# Patient Record
Sex: Female | Born: 1955 | Race: White | Hispanic: No | Marital: Married | Smoking: Current every day smoker
Health system: Southern US, Community
[De-identification: ages and names within clinical notes are randomized; demographics above are authoritative.]

## PROBLEM LIST (undated history)

## (undated) DIAGNOSIS — K219 Gastro-esophageal reflux disease without esophagitis: Secondary | ICD-10-CM

## (undated) DIAGNOSIS — M26609 Unspecified temporomandibular joint disorder, unspecified side: Secondary | ICD-10-CM

## (undated) DIAGNOSIS — F329 Major depressive disorder, single episode, unspecified: Secondary | ICD-10-CM

## (undated) DIAGNOSIS — K5790 Diverticulosis of intestine, part unspecified, without perforation or abscess without bleeding: Secondary | ICD-10-CM

## (undated) DIAGNOSIS — G8929 Other chronic pain: Secondary | ICD-10-CM

## (undated) DIAGNOSIS — H548 Legal blindness, as defined in USA: Secondary | ICD-10-CM

## (undated) DIAGNOSIS — F410 Panic disorder [episodic paroxysmal anxiety] without agoraphobia: Secondary | ICD-10-CM

## (undated) DIAGNOSIS — G9589 Other specified diseases of spinal cord: Secondary | ICD-10-CM

## (undated) DIAGNOSIS — J449 Chronic obstructive pulmonary disease, unspecified: Secondary | ICD-10-CM

## (undated) DIAGNOSIS — K589 Irritable bowel syndrome without diarrhea: Secondary | ICD-10-CM

## (undated) DIAGNOSIS — Z9981 Dependence on supplemental oxygen: Secondary | ICD-10-CM

## (undated) DIAGNOSIS — R0600 Dyspnea, unspecified: Secondary | ICD-10-CM

## (undated) DIAGNOSIS — J189 Pneumonia, unspecified organism: Secondary | ICD-10-CM

## (undated) DIAGNOSIS — F4310 Post-traumatic stress disorder, unspecified: Secondary | ICD-10-CM

## (undated) DIAGNOSIS — F32A Depression, unspecified: Secondary | ICD-10-CM

## (undated) DIAGNOSIS — C801 Malignant (primary) neoplasm, unspecified: Secondary | ICD-10-CM

## (undated) HISTORY — PX: EYE SURGERY: SHX253

## (undated) HISTORY — PX: FRACTURE SURGERY: SHX138

## (undated) HISTORY — PX: APPENDECTOMY: SHX54

## (undated) HISTORY — PX: CHOLECYSTECTOMY: SHX55

---

## 2002-10-09 ENCOUNTER — Emergency Department (HOSPITAL_COMMUNITY): Admission: EM | Admit: 2002-10-09 | Discharge: 2002-10-09 | Payer: Self-pay | Admitting: Emergency Medicine

## 2005-10-17 ENCOUNTER — Ambulatory Visit (HOSPITAL_COMMUNITY): Admission: RE | Admit: 2005-10-17 | Discharge: 2005-10-17 | Payer: Self-pay | Admitting: Family Medicine

## 2006-02-12 ENCOUNTER — Ambulatory Visit (HOSPITAL_COMMUNITY): Admission: RE | Admit: 2006-02-12 | Discharge: 2006-02-12 | Payer: Self-pay | Admitting: Family Medicine

## 2006-10-01 ENCOUNTER — Ambulatory Visit: Payer: Self-pay | Admitting: Internal Medicine

## 2006-10-16 ENCOUNTER — Encounter (INDEPENDENT_AMBULATORY_CARE_PROVIDER_SITE_OTHER): Payer: Self-pay | Admitting: Specialist

## 2006-10-16 ENCOUNTER — Ambulatory Visit: Payer: Self-pay | Admitting: Internal Medicine

## 2006-10-16 ENCOUNTER — Ambulatory Visit (HOSPITAL_COMMUNITY): Admission: RE | Admit: 2006-10-16 | Discharge: 2006-10-16 | Payer: Self-pay | Admitting: Internal Medicine

## 2006-11-03 ENCOUNTER — Ambulatory Visit (HOSPITAL_COMMUNITY): Admission: RE | Admit: 2006-11-03 | Discharge: 2006-11-03 | Payer: Self-pay | Admitting: Family Medicine

## 2007-02-24 ENCOUNTER — Emergency Department (HOSPITAL_COMMUNITY): Admission: EM | Admit: 2007-02-24 | Discharge: 2007-02-24 | Payer: Self-pay | Admitting: Emergency Medicine

## 2007-09-07 ENCOUNTER — Ambulatory Visit: Payer: Self-pay | Admitting: Orthopedic Surgery

## 2008-02-19 ENCOUNTER — Observation Stay (HOSPITAL_COMMUNITY): Admission: AD | Admit: 2008-02-19 | Discharge: 2008-02-20 | Payer: Self-pay | Admitting: Family Medicine

## 2008-03-07 ENCOUNTER — Ambulatory Visit: Payer: Self-pay | Admitting: Internal Medicine

## 2008-03-17 ENCOUNTER — Ambulatory Visit: Payer: Self-pay | Admitting: Internal Medicine

## 2008-05-28 ENCOUNTER — Emergency Department (HOSPITAL_COMMUNITY): Admission: EM | Admit: 2008-05-28 | Discharge: 2008-05-28 | Payer: Self-pay | Admitting: Emergency Medicine

## 2008-06-01 ENCOUNTER — Ambulatory Visit (HOSPITAL_COMMUNITY): Admission: RE | Admit: 2008-06-01 | Discharge: 2008-06-01 | Payer: Self-pay | Admitting: Family Medicine

## 2008-06-15 ENCOUNTER — Encounter (HOSPITAL_COMMUNITY): Admission: RE | Admit: 2008-06-15 | Discharge: 2008-07-15 | Payer: Self-pay | Admitting: Family Medicine

## 2008-07-16 ENCOUNTER — Emergency Department (HOSPITAL_COMMUNITY): Admission: EM | Admit: 2008-07-16 | Discharge: 2008-07-17 | Payer: Self-pay | Admitting: Emergency Medicine

## 2008-07-20 ENCOUNTER — Encounter (INDEPENDENT_AMBULATORY_CARE_PROVIDER_SITE_OTHER): Payer: Self-pay | Admitting: General Surgery

## 2008-07-20 ENCOUNTER — Observation Stay (HOSPITAL_COMMUNITY): Admission: RE | Admit: 2008-07-20 | Discharge: 2008-07-21 | Payer: Self-pay | Admitting: General Surgery

## 2008-08-14 ENCOUNTER — Emergency Department (HOSPITAL_COMMUNITY): Admission: EM | Admit: 2008-08-14 | Discharge: 2008-08-14 | Payer: Self-pay | Admitting: Emergency Medicine

## 2008-08-21 ENCOUNTER — Emergency Department (HOSPITAL_COMMUNITY): Admission: EM | Admit: 2008-08-21 | Discharge: 2008-08-21 | Payer: Self-pay | Admitting: Emergency Medicine

## 2008-09-15 ENCOUNTER — Emergency Department (HOSPITAL_COMMUNITY): Admission: EM | Admit: 2008-09-15 | Discharge: 2008-09-15 | Payer: Self-pay | Admitting: Emergency Medicine

## 2008-10-05 ENCOUNTER — Emergency Department (HOSPITAL_COMMUNITY): Admission: EM | Admit: 2008-10-05 | Discharge: 2008-10-05 | Payer: Self-pay | Admitting: Emergency Medicine

## 2008-10-25 ENCOUNTER — Emergency Department (HOSPITAL_COMMUNITY): Admission: EM | Admit: 2008-10-25 | Discharge: 2008-10-25 | Payer: Self-pay | Admitting: Emergency Medicine

## 2008-10-28 ENCOUNTER — Emergency Department (HOSPITAL_COMMUNITY): Admission: EM | Admit: 2008-10-28 | Discharge: 2008-10-28 | Payer: Self-pay | Admitting: Emergency Medicine

## 2009-02-21 ENCOUNTER — Ambulatory Visit (HOSPITAL_COMMUNITY): Admission: RE | Admit: 2009-02-21 | Discharge: 2009-02-21 | Payer: Self-pay | Admitting: Family Medicine

## 2009-02-24 ENCOUNTER — Ambulatory Visit (HOSPITAL_COMMUNITY): Admission: RE | Admit: 2009-02-24 | Discharge: 2009-02-24 | Payer: Self-pay | Admitting: Family Medicine

## 2011-01-06 ENCOUNTER — Encounter: Payer: Self-pay | Admitting: Family Medicine

## 2011-01-08 ENCOUNTER — Emergency Department (HOSPITAL_COMMUNITY)
Admission: EM | Admit: 2011-01-08 | Discharge: 2011-01-08 | Payer: Self-pay | Source: Home / Self Care | Admitting: Emergency Medicine

## 2011-01-09 LAB — DIFFERENTIAL
Basophils Absolute: 0 10*3/uL (ref 0.0–0.1)
Basophils Relative: 0 % (ref 0–1)
Eosinophils Absolute: 0.1 10*3/uL (ref 0.0–0.7)
Eosinophils Relative: 1 % (ref 0–5)
Lymphocytes Relative: 38 % (ref 12–46)
Monocytes Absolute: 0.5 10*3/uL (ref 0.1–1.0)
Monocytes Relative: 6 % (ref 3–12)
Neutro Abs: 5.2 10*3/uL (ref 1.7–7.7)
Neutrophils Relative %: 55 % (ref 43–77)

## 2011-01-09 LAB — CBC
HCT: 42.1 % (ref 36.0–46.0)
Hemoglobin: 14.8 g/dL (ref 12.0–15.0)
MCH: 33.2 pg (ref 26.0–34.0)
MCHC: 35.2 g/dL (ref 30.0–36.0)
Platelets: 218 10*3/uL (ref 150–400)
RBC: 4.46 MIL/uL (ref 3.87–5.11)
RDW: 12.8 % (ref 11.5–15.5)
WBC: 9.3 10*3/uL (ref 4.0–10.5)

## 2011-01-09 LAB — BASIC METABOLIC PANEL
BUN: 12 mg/dL (ref 6–23)
CO2: 31 mEq/L (ref 19–32)
Calcium: 9.7 mg/dL (ref 8.4–10.5)
Chloride: 104 mEq/L (ref 96–112)
Creatinine, Ser: 0.61 mg/dL (ref 0.4–1.2)
GFR calc non Af Amer: >60 mL/min (ref >60)
Glucose, Bld: 91 mg/dL (ref 70–99)
Potassium: 4.1 mEq/L (ref 3.5–5.1)
Sodium: 141 mEq/L (ref 135–145)

## 2011-01-09 LAB — SEDIMENTATION RATE: Sed Rate: 6 mm/hr (ref 0–22)

## 2011-04-30 NOTE — Op Note (Signed)
NAMETIFFANCY, MOGER               ACCOUNT NO.:  [id number]   MEDICAL RECORD NO.:  [id number]          PATIENT TYPE:  OBV   LOCATION:  A307                          FACILITY:  [facility]   PHYSICIAN:  Tilford Pillar, MD      DATE OF BIRTH:  1956-01-18   DATE OF PROCEDURE:  07/20/2008  DATE OF DISCHARGE:                               OPERATIVE REPORT   PREOPERATIVE DIAGNOSIS:  Biliary dyskinesia.   POSTOPERATIVE DIAGNOSIS:  Biliary dyskinesia.   PROCEDURE:  Laparoscopic cholecystectomy.   SURGEON:  Tilford Pillar, MD.   ANESTHESIA:  General endotracheal, local anesthetic, and 0.5%  Sensorcaine plain.   SPECIMEN:  Gallbladder.   ESTIMATED BLOOD LOSS:  Minimal.   INDICATIONS:  The patient is a 54 year old female with several medical  problems including persistent long course of nausea and epigastric  abdominal pain.  She had had extensive workup in regards with nausea and  epigastric pain including upper endoscopy, right upper quadrant  ultrasound, and HIDA evaluation.  On her HIDA scan, she did have some  exacerbation of her symptomatology with the oral administration of cream  as well as had a gallbladder demonstrating decreased ejection fraction.  Based on this, I have also discussed with the patient the findings of  biliary dyskinesia.  However, due to her long history of nausea, it was  discussed at length with the patient that all of her symptomatology may  not be attributable to the gallbladder.  However, even if this was  discussed at length with the patient, the patient did not wish to  proceed with a cholecystectomy.  The risk, benefits, and alternatives  were discussed at length with the patient including but not limited to  the risk of bleeding, infection, bile leak, small bowel injury, common  bile duct injury as well as possibility of intraoperative cardiac or  pulmonary events.  The patient's questions and concerns were addressed,  and the patient was consented for the  planned procedure.   OPERATION:  The patient was taken to the operating room and was placed  in a supine position on the operating room table at which time the  general anesthetic was administered.  Once the patient was asleep, she  was then endotracheally intubated by anesthesia.  Her abdomen was  prepped and draped with DuraPrep solution and draped in standard  fashion.  A stab incision was created supraumbilically with an 11-blade  scalpel.  Additional dissection down through subcuticular tissue was  carried out using a Kocher clamp, which was utilized to grasp the  anterior abdominal fascia and __________ anteriorly.  A Veress needle  was inserted.  Saline drop test was utilized to confirm intraperitoneal  placement, pneumoperitoneum was then initiated.  Once sufficient  pneumoperitoneum was obtained, the 11-mm trocar was inserted over the  laparoscope allowing visualization of the trocar entering into the  peritoneum.  At this point, the inner cannula was removed.  The  laparoscope was reinserted.  There was no evidence of any trocar or  Veress needle placement injury.  At this point, the patient was placed  in a reverse  Trendelenburg left lateral decubitus position.  The  remaining trocars were placed; an 11-mm trocar in the epigastrium, 5-mm  trocar in the right lateral abdominal wall, and a 5-mm trocar between  the two 11-mm trocars in the midline.  At this point, the fundus of the  gallbladder were lifted up and over the liver.  There were some omental  adhesions to the gallbladder.  These were bluntly stripped using a  combination of blunt and electrocautery dissection.  The peritoneal  reflection was then stripped off the infundibulum allowing exposure of  the cystic duct entering into the infundibulum.  With the cystic duct  exposed, a window was created behind the cystic duct.  Three Endoclips  were placed proximally, one distally, and cystic duct was divided  between the  most distal clips.  Similarly, the cystic artery was  identified.  The Endoclips were placed proximally and one distally, and  the cystic artery was divided between the two most distal clips.  At  this point, the gallbladder was dissected free from the gallbladder  fossa using electrocautery.  Once the gallbladder was free, it was  placed in the EndoCatch bag, which was placed up and over the right lobe  of the liver.  At this point, attention was turned to closure.   Using a Endoclose suture passing device, a 2-0 Vicryl was passed through  both the 11-mm trocar sites.  With these Vicryl sutures in place, the  gallbladder fossa was reevaluated.  Hemostasis was excellent.  A piece  of Surgicel was placed into the gallbladder fossa and then the  gallbladder was grasped and removed through the epigastric trocar site  in an intact EndoCatch bag.  With the gallbladder removed, this was  placed on the back table and sent as a permanent specimen to pathology.  At this time, the pneumoperitoneum was evacuated.  The Vicryl sutures  were then secured.  The local anesthetic was instilled and a 4-0  Monocryl was utilized to reapproximate the skin edges at all 4-trocar  sites with a running subcuticular suture.  The skin was washed and dried  with a moist and dry towel.  Benzoin was applied around the incision.  Half-inch Steri-Strips were placed, and the patient was allowed to come  out of general anesthetic.  She was transferred back to regular hospital  bed and was transferred to the postanesthetic care unit in a stable  condition.  At the conclusion of the procedure, all instrument, sponge,  and needle counts were correct.  The patient tolerated the procedure  well.      Tilford Pillar, MD  Electronically Signed     BZ/MEDQ  D:  07/20/2008  T:  07/21/2008  Job:  [id number]   cc:   Primary Care Physician

## 2011-04-30 NOTE — H&P (Signed)
[name], [name]               ACCOUNT NO.:  [id number]   MEDICAL RECORD NO.:  [id number]          PATIENT TYPE:  OBV   LOCATION:  A313                          FACILITY:  [facility]   PHYSICIAN:  Scott A. Gerda Diss, MD    DATE OF BIRTH:  09-07-1956   DATE OF ADMISSION:  DATE OF DISCHARGE:  LH                              HISTORY & PHYSICAL   CHIEF COMPLAINT:  Vomiting and diarrhea.   HISTORY OF PRESENT ILLNESS:  This is a 55 year old female who presented  to the office with approximately 2 week history of sickness. She states  it started with vomiting, diarrhea, pretty intense for the first 48  yours, then got a little bit better but she has had intermittent nausea  along with heartburn for the past 7 or 8 days and she states that it is  just very hard for her to swallow anything without it just making her  feel just really sick. She says she continues to just have very poor  intake over the last several days, only drinking small amounts of fluid.  She has not had any perfuse diarrhea. Just as watery stools, nonbloody.  Denies fever. Denies change in bowel color. Denies change in urine  color, other than it getting darker and less frequent. She relates she  is finding herself feeling quite dizzy when she stands up or when she  tries to move about. She states her energy level is sub-par and she just  feels very weak.   PAST MEDICAL HISTORY:  1. She has a history of irritable bowel syndrome. She had a negative      colonoscopy in November 2007. She had a Schatzki's ring with EGD      and Dr. Jena Gauss did a scope test back in November 2007. She has had H-      pylori, which was treated back in 2001.   FAMILY HISTORY:  Breast cancer, hypertension, heart disease.   SOCIAL HISTORY:  She does smoke. She denies illicit drug use. Denies  alcohol use.   CURRENT MEDICATIONS:  She does use intermittent Valium from other health  care [name]. Plus also, she uses Vicodin on occasional basis. She  does  take Protonix 40 mg daily. Denies anti-inflammatory use.   ALLERGIES:  NO KNOWN DRUG ALLERGIES.   REVIEW OF SYSTEMS:  Negative for headache. Negative for throat pain.  Negative for fever. Recently, did have some mild fever and chills when  this all began. Denies any severe abdominal pain, just relates nausea.   PHYSICAL EXAMINATION:  HEENT:  Lips are dry. Mucous membranes tachy.  NECK:  Supple.  VITAL SIGNS:  Pulse tachy around 100 to 110. Blood pressure 90/70 when  laying and standing it was 76/50. Weight is 107 pounds, whereas last  time her weight was 119 pounds on January 04, 2008. So, she has gone  down approximately 12 pounds over the past 6 weeks.  LUNGS:  No crackles.  CARDIOVASCULAR:  Regular. No murmurs.  ABDOMEN:  Soft. No guarding or rebound.  EXTREMITIES:  No edema.   LABORATORY DATA:  Her urine  in the office is concentrated with negative  ketones. Her met-7 has BUN and creatinine that is in acceptable range as  well as sodium and potassium. White count was good. Hemoglobin was good.  Liver profile was normal.   ASSESSMENT/PLAN:  Gastroenteritis. Zofran as needed for nausea. She is  complaining about headache today. Will use Dilaudid for headache and go  ahead and dry clear liquids. If she does okay with that, advance her to  a soft diet. In addition to this, I do not feel that this is gallbladder  disease. I do not feel that there is any type of recurrence of a  Schatzki's ring and I do not feel that she is dealing with an active  ulcer. I would recommend the Protonix and then GASTROINTESTINAL cocktail  and Maalox p.r.n. and I told her that if IV fluids went well and she was  able to urinate and keep liquids down, would advance to a soft diet in  the morning. If she is able to keep that down, then there is a good  possibility that she will be able to go home tomorrow on Protonix and on  a small dose of Vicodin that she can use as needed for headache. She   would need to followup in the office in 4 or 5 days for followup. If she  had continued trouble, would need to intervene by also looking at doing  potential further workup as an outpatient for gallbladder disease and  potentially further workup by having her see GI doctor for possible  procedures. I do feel at this point in time she is overall stable but  because of weight decrease and low blood pressure, IV fluids are  indicated. The patient agrees to this course. Also too, the hospitalist  will be seeing the patient on Saturday and overall, if she is doing  improved as above, she will followup in the office early next week.      Scott A. Gerda Diss, MD  Electronically Signed     SAL/MEDQ  D:  02/20/2008  T:  02/20/2008  Job:  [id number]

## 2011-04-30 NOTE — Assessment & Plan Note (Signed)
[facility]                         GASTROENTEROLOGY OFFICE NOTE   LESHAY, DESAULNIERS                      MRN:          [id number]  DATE:03/07/2008                            DOB:          04/16/1956    REQUESTING PHYSICIAN:  Scott A. Luking, M.D.   CHIEF COMPLAINT:  Abdominal pain, appetite and weight loss, alternating  bowel habits.   ASSESSMENT:  A 55 year old white woman who really looks like she has  irritable bowel syndrome based upon chronicity of symptoms and previous  workup including EGD and colonoscopy.  The situation is complicated by  chronic narcotic use which has resulted from abdominal pain problems as  well as other chronic pain.  She clearly has had very similar symptoms  off and on for a number of years.  It should be noted that this lady  weighed 112 pounds in 2007, when she was evaluated at [facility].   RECOMMENDATIONS AND PLAN:  1. CT of the abdomen and pelvis with IV and oral contrast.  2. If that is unrevealing, I think that the most likely help for this      lady would be to have an antidepressant started, something like      Remeron may be useful for her weight loss, appetite loss, and      nausea.  An SSRI may prove useful or she may need both of these.  3. I sincerely doubt further GI workup is indicated at this time.  4. Could try antispasmodics as well.   HISTORY:  This is a 55 year old white woman who complains of almost  every symptom on our GI review of systems list being positive.  She  complains of dysphagia, though she has previously had a Maloney  dilation.  She has indigestion and burning epigastric pain, nausea,  sometimes regurgitation, and rare vomiting perhaps.  She has lost, she  says, from 118-106 pounds in February.  She was admitted to the hospital  with normal CBC, C-MET testing just earlier this month, up in [place]  at [facility].  She had a lot of vomiting and diarrhea  at that  time.  She did have a concentrated urine but negative ketones.  She  seems to have recovered from that acute episode somewhat but still has  this other background of problems.  She is on Vicodin 4 times a day and  says she has been on that regularly for a couple of months but  intermittently prior to that but really has been taking it for a year.  She does realize she has a diagnosis of irritable bowel syndrome.  She  was not able to tolerate dicyclomine in the past.  She actually thinks  (if we are correct) if she got diarrhea with Lotronex.  She was to do  stool cultures but really has not been moving her bowels much since the  diarrheal illness and has not collected them yet because of that.   PAST WORKUP:  Included an EGD by Dr. Jonathon Bellows, with [facility]  dilation.  She had duodenal biopsies at that  time and these did not show  any evidence of celiac disease, this was in November 2007.  She had a  colonoscopy that was normal as well except for a few scattered  diverticula.  Stool samples were collected at that time and no infection  was found.   PAST MEDICAL HISTORY:  1. Irritable bowel syndrome.  2. Diverticulosis.  3. EGD, November 2007, did show a nonobstructive Schatzki ring and she      was dilated.  4. TMJ pain.  5. Fractured jaw on the left.  6. Eye surgery x2.  7. Chronic headaches.  8. Bilateral eye surgery.  9. 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  10.Depression.  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  12.History of H. pylori years ago at EGD, did not tolerate treatment.      No mention of gastritis at Dr. Luvenia Starch EGD - 2007.   FAMILY HISTORY:  Father had heart disease.  A cousin has had some sort  of inflammatory bowel disease.  Mother has had colon polyps.   SOCIAL HISTORY:  The patient is married.  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  She lives with her  husband.  They went  through some difficulty with loss of insurance or no  insurance because she was not working and her husband had an MI, though  she is now employed and insured.  She smokes cigarettes.  No alcohol or  other tobacco or drugs.   REVIEW OF SYSTEMS:  She is entering menopause and feels quite  symptomatic with night sweats and hot flashes disturbing her sleep,  though her GI symptoms do not tend to do so.  The remainder of the  review of systems positive for fatigue, urinary leakage, insomnia, and  sleep disturbance, joint pains, allergies, and eyeglasses, as well as  chronic back pain.  All other systems negative at this time.   PHYSICAL EXAMINATION:  VITAL SIGNS:  Reveal height 5 feet 1 inch, weight  110 pounds, blood pressure 112/68, pulse 76.  EYES:  Anicteric.  ENT:  Normal mouth, posterior pharynx.  NECK:  Supple.  No thyromegaly, mass.  CHEST:  Clear.  HEART:  S1 S2.  No murmurs, gallops, or rubs.  ABDOMEN:  Soft, benign.  No organomegaly or mass.  Nontender.  LYMPHATIC:  No neck, supraclavicular, axillary, or inguinal adenopathy.  EXTREMITIES:  Free of edema.  SKIN:  Warm and dry.  No acute rash.  PSYCH:  She has a somewhat flat affect.  Cranial nerves II-XII appear to  be intact.  Extraocular muscles, there is a right lateral gaze  preferential on the right eye.  Visual acuity was not tested.     Iva Boop, MD,FACG  Electronically Signed    CEG/MedQ  DD: 03/07/2008  DT: 03/07/2008  Job #: [id number]   cc:   Lorin Picket A. Gerda Diss, MD

## 2011-05-03 NOTE — Op Note (Signed)
[name], [name]               ACCOUNT NO.:  [id number]   MEDICAL RECORD NO.:  [id number]          PATIENT TYPE:  AMB   LOCATION:  DAY                           FACILITY:  [facility]   PHYSICIAN:  R. Roetta Sessions, M.D. DATE OF BIRTH:  [birth year]   DATE OF PROCEDURE:  10/16/2006  DATE OF DISCHARGE:                                 OPERATIVE REPORT   PROCEDURE PERFORMED:  Esophagogastroduodenoscopy with small bowel biopsies  and Maloney dilation and colonoscopy.   INDICATIONS FOR PROCEDURE:  The patient is a 55 year old lady with multiple  GI complaints including bloating, diarrhea and esophageal dysphagia.  She  has had weight loss as well.   Esophagogastroduodenoscopy with possible esophageal dilation and colonoscopy  is now being done.  This approach has been discussed with the patient at  length, potential risks, benefits and alternatives have been reviewed and  questions answered.  The patient is agreeable.  Please see documentation in  the medical record.   Oxygen saturations, blood pressure, pulse and respirations were monitored  throughout the entirety of the procedure.   CONSCIOUS SEDATION:  Versed 8 mg IV, Demerol 150 mg IV, Phenergan 25 mg  diluted slow IV push in incremental doses prior to the procedure.  Cetacaine  spray for topical pharyngeal anesthesia.   FINDINGS:  EGD examination of the tubular esophagus revealed a noncritical  Schatzki's ring.  Esophageal mucosa otherwise appeared normal.  Esophagogastric junction was easily traversed with the scope.   Stomach:  The gastric cavity was empty and insufflated well with air.  Thorough examination of the gastric mucosa including retroflex view of the  proximal stomach, esophagogastric junction demonstrated only a small hiatal  hernia.  Pylorus was patent and easily traversed.  Examination of the bulb  to second portion revealed grossly normal-appearing duodenal mucosa.   THERAPY/DIAGNOSTIC MANEUVERS PERFORMED:  1.  Biopsies of second and third portions of duodenum were taken for      histologic study.  Subsequently a 76 French Maloney dilator was passed      to full insertion. A look back revealed a superficial tear in      midesophagus.  Otherwise, no apparent complications related to passage      of the dilator.  The ring was disrupted.  The patient tolerated the      procedure well and was prepared for colonoscopy.   Digital exam revealed no abnormalities.   ENDOSCOPIC FINDINGS:  Prep was adequate.   Rectum:  Examination of the rectal mucosa including retroflex view of the  anal verge revealed no abnormalities.  Colon:  Colonic mucosa was surveyed from the rectosigmoid junction to the  left, transverse and right colon to the area of the appendiceal orifice and  ileocecal valve and cecum.  These structures were well seen and photographed  for the record.  I was unable to intubate the terminal ileum.  From this  level, the scope was slowly withdrawn.  All previously mentioned mucosal  surfaces were again seen.  The patient had a couple of scattered left-sided  diverticula, minimal in number.  Otherwise, the colonic mucosa  appeared  entirely normal.  The patient tolerated both procedures well, was reacted in  endoscopy.   IMPRESSION:  Esophagogastroduodenoscopy:  Schatzki's ring, otherwise  esophagus status post dilation as described above.  Small hiatal hernia,  otherwise normal stomach D1 through D3, status post biopsies of D2 and D3.   Colonoscopy:  Normal rectum, few scattered sigmoid diverticula,  remainder  colonic mucosa appeared normal.  Terminal ileum not intubated.  Not  mentioned above, stool samples collected.   RECOMMENDATIONS:  1. Continue Aciphex 20 mg orally twice daily for now.  2. Add Carafate 1 g slurry four times daily for five days.  3. Lortab 7.5 mg per 5 mL elixir, 7.5 mg every four to six hours as needed      for discomfort related to esophageal dilation.  4. Clear  liquid diet the rest of today.  Soft diet tomorrow, gradually      advance diet as tolerated after tomorrow.  Follow up on path.  5. Further recommendations to follow.      Jonathon Bellows, M.D.  Electronically Signed     RMR/MEDQ  D:  10/16/2006  T:  10/16/2006  Job:  [id number]   cc:   Lorin Picket A. Gerda Diss, MD  Fax: [phone]

## 2011-05-03 NOTE — Consult Note (Signed)
NAMEDHRITI, FALES               ACCOUNT NO.:  [id number]   MEDICAL RECORD NO.:  [id number]          PATIENT TYPE:  AMB   LOCATION:                                FACILITY:  [facility]   PHYSICIAN:  R. Roetta Sessions, M.D. DATE OF BIRTH:  Apr 26, 1956   DATE OF CONSULTATION:  DATE OF DISCHARGE:                                   CONSULTATION   REASON FOR CONSULTATION:  Multiple GI complaints.   HPI:  Ms. Lapaglia is a 55 year old, Caucasian female who tells me she has a  longstanding history of GI problems.  She tells me she has a history of IBS.  She says she recently moved to __________ approximately 2 years ago.  In the  last year, she tells me she just can't eat.  She has daily heartburn and  indigestion.  She also complains of oral pain with eating.  She feels as  though food gets stuck in her mid esophagus and stays there.  She has most  problems with solids which seem to roll back up her esophagus.  She denies  any problems with liquids.  She has daily nausea and vomiting.  She even has  awakened in the wee hours of the morning with vomiting.  She tells me she  has a hot poker to her epigastrium.  Pain is intermittent and she also has  a griping low abdominal pain.  She tells me she has lost 10 pounds in the  last year.  She is currently not eating any meals throughout the day.  She  is maintaining on Ensure and she is afraid to eat anything else.  She has  anorexia.  She has early satiety and she has frequent hiccups.  She tells me  anytime she eats anything she has diarrhea.  She can have anywhere from 4 to  20+ stools a day.  Generally, she begins to have diarrhea as soon as she  awakens in the morning.  She denies any rectal bleeding or melena.   PAST MEDICAL HISTORY:  1. Arthritis.  2. Anxiety.  3. 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  4. Bilateral eye surgery.  5. She tells me as an infant she had lactose intolerance and had multiple      food allergies.  6.  Chronic GERD.  7. IBS.  8. Depression.  9. She had a colonoscopy in the 1990s which she tells me was normal.  10.She is legally blind in the right eye.  She has a history of H. pylori,      however she was unable to tolerate treatment.   CURRENT MEDICATIONS:  1. Celexa 20 mg daily.  2. AcipHex 20 mg b.i.d.  3. Phenergan 25 mg q. 8 h. p.r.n.  4. Flexeril 10 mg nightly p.r.n.  5. Vicodin 5/500 mg q.i.d.  6. Imodium 2 per day p.r.n.   ALLERGIES:  BENADRYL, COMPAZINE, DARVOCET, AND FACTIVE.   FAMILY HISTORY:  There is a family history of a mother with IBS, breast  cancer, and DVTs.  Father age 39 with coronary artery  disease.  She has 1  healthy brother.   SOCIAL HISTORY:  Ms. Cliett has been married for 16 years.  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  She  has a 34-pack year history of tobacco use.  She denies any alcohol or drug  use.   REVIEW OF SYSTEM:  CONSTITUTIONAL:  See HPI.  Denies any fevers or chills.  CARDIOVASCULAR:  Denies any chest pain or palpitations.  PULMONARY:  Denies  any shortness of breath, dyspnea, cough, hemoptysis.  GI:  See HPI.   PHYSICAL EXAM:  VITAL SIGNS:  Weight 112.5 pounds.  Height 61 inches.  Temp  97.8.  Blood pressure 98/60 and pulse 64.  GENERAL:  Ms. Mcmannis is a 55 year old, thin, Caucasian female who is alert,  oriented, and pleasant, cooperative.  She is quite tearful.  She is in no  acute distress.  HEENT:  Oropharynx clear.  Sclerae nonicteric.  Conjunctivae pink.  Right  eye deviates laterally.  NECK:  Supple without mass or thyromegaly.  HEART:  Regular rate and rhythm.  Normal S1, S2 without murmurs, rubs, or  gallops.  LUNGS:  Clear to auscultation bilaterally.  ABDOMEN:  She has generalized abdominal pain throughout the entire abdomen.  There is no rebound tenderness or guarding.  No hepatosplenomegaly or mass.  EXTREMITIES:  Without clubbing or edema bilaterally.  SKIN:  Pink,  warm, and dry without rash or jaundice.   IMPRESSION:  Ms. Domke is a 55 year old, Caucasian female with multiple  gastrointestinal concerns today.  She tells me she just can't eat.  She  has anorexia, daily heartburn, and indigestion, as well as intermittent  nausea and vomiting.  She has solid food dysphagia as well as early satiety.  At this point, she is only consuming Ensure.  She tells me she is not able  to eat meals because she just gets so sick.  She is having chronic daily,  nonbloody diarrhea with upwards of 20 stools a day.  She tells me she has an  underlying history of irritable bowel syndrome, however, her symptoms  definitely seem very severe to be labeled irritable bowel syndrome and she  is going to require further evaluation with colonoscopy and  esophagogastroduodenoscopy at this time.  She also tells me she has a  history of being Helicobacter pylori positive but was unable to complete  treatment and we may have to determine whether she has been successfully  treated in the future if she remains symptomatic.   PLAN:  1. Esophagogastroduodenoscopy and colonoscopy with Dr. Jena Gauss in the near      future.  We discussed this procedure including risks and benefits      including but not limited to bleeding, infection, perforation, drug      reaction.  She agrees with plan and consent will be obtained.  2. Continue Imodium p.r.n. for now.  3. Continue Protonix 40 mg b.i.d.   I would like to thank Dr. Lilyan Punt for allowing [place] to participate in the  care of Ms. Brunsman.      Nicholas Lose, N.P.      Jonathon Bellows, M.D.  Electronically Signed    KC/MEDQ  D:  10/01/2006  T:  10/02/2006  Job:  [id number]

## 2011-09-09 LAB — HEPATIC FUNCTION PANEL
ALT: 15
AST: 19
Albumin: 4.1
Alkaline Phosphatase: 75
Bilirubin, Direct: 0.1
Indirect Bilirubin: 0.6
Total Bilirubin: 0.7
Total Protein: 7.6

## 2011-09-09 LAB — BASIC METABOLIC PANEL
BUN: 10
CO2: 27
Calcium: 10
Chloride: 100
Creatinine, Ser: 0.66
GFR calc Af Amer: >60
GFR calc non Af Amer: >60
Glucose, Bld: 94
Potassium: 3.6
Sodium: 137

## 2011-09-09 LAB — DIFFERENTIAL
Basophils Absolute: 0
Basophils Relative: 0
Eosinophils Absolute: 0
Eosinophils Relative: 0
Lymphocytes Relative: 24
Lymphs Abs: 2.4
Monocytes Absolute: 0.7
Monocytes Relative: 7
Neutro Abs: 6.9
Neutrophils Relative %: 69

## 2011-09-09 LAB — CBC
HCT: 45.7
Hemoglobin: 15.9 — ABNORMAL HIGH
MCHC: 34.8
MCV: 91.4
Platelets: 252
RBC: 5
RDW: 13.3
WBC: 10.1

## 2011-09-12 LAB — URINALYSIS, ROUTINE W REFLEX MICROSCOPIC
Glucose, UA: NEGATIVE
Ketones, ur: 15 — AB
Leukocytes, UA: NEGATIVE
Nitrite: NEGATIVE
Protein, ur: 30 — AB
Specific Gravity, Urine: >1.03 — ABNORMAL HIGH
Urobilinogen, UA: 1
pH: 6

## 2011-09-12 LAB — COMPREHENSIVE METABOLIC PANEL
ALT: 15
AST: 20
Albumin: 4.3
Alkaline Phosphatase: 66
BUN: 12
CO2: 26
Calcium: 9.7
Chloride: 101
Creatinine, Ser: 0.67
GFR calc Af Amer: >60
GFR calc non Af Amer: >60
Glucose, Bld: 128 — ABNORMAL HIGH
Potassium: 3.4 — ABNORMAL LOW
Sodium: 138
Total Bilirubin: 0.5
Total Protein: 7.9

## 2011-09-12 LAB — URINE MICROSCOPIC-ADD ON

## 2011-09-12 LAB — CBC
HCT: 44.6
Hemoglobin: 15.1 — ABNORMAL HIGH
MCHC: 33.9
MCV: 92.8
Platelets: 227
RBC: 4.8
RDW: 13.1
WBC: 12.9 — ABNORMAL HIGH

## 2011-09-12 LAB — DIFFERENTIAL
Basophils Absolute: 0
Basophils Relative: 0
Eosinophils Absolute: 0
Eosinophils Relative: 0
Lymphocytes Relative: 9 — ABNORMAL LOW
Lymphs Abs: 1.2
Monocytes Absolute: 0.3
Monocytes Relative: 2 — ABNORMAL LOW
Neutro Abs: 11.5 — ABNORMAL HIGH
Neutrophils Relative %: 89 — ABNORMAL HIGH

## 2011-09-13 LAB — COMPREHENSIVE METABOLIC PANEL
Alkaline Phosphatase: 51
BUN: 15
Chloride: 104
Glucose, Bld: 98
Potassium: 3.9
Total Bilirubin: 0.5

## 2011-09-13 LAB — URINALYSIS, ROUTINE W REFLEX MICROSCOPIC
Ketones, ur: 15 — AB
Protein, ur: NEGATIVE
Urobilinogen, UA: 0.2

## 2011-09-13 LAB — URINE CULTURE: Colony Count: 6000

## 2011-09-13 LAB — CBC
HCT: 39.6
HCT: 42.3
Hemoglobin: 14.1
Platelets: 205
RBC: 4.53
RDW: 13.7
WBC: 22.3 — ABNORMAL HIGH
WBC: 8.9

## 2011-09-13 LAB — DIFFERENTIAL
Basophils Absolute: 0
Basophils Absolute: 0
Basophils Relative: 0
Eosinophils Absolute: 0.1
Lymphocytes Relative: 9 — ABNORMAL LOW
Lymphs Abs: 2
Monocytes Absolute: 0.4
Neutro Abs: 19.3 — ABNORMAL HIGH
Neutro Abs: 5.4
Neutrophils Relative %: 60
Neutrophils Relative %: 87 — ABNORMAL HIGH

## 2011-09-13 LAB — BASIC METABOLIC PANEL
BUN: 7
Calcium: 9
Creatinine, Ser: 0.53
GFR calc non Af Amer: >60
Potassium: 3.6

## 2011-09-13 LAB — URINE MICROSCOPIC-ADD ON

## 2011-09-16 LAB — URINALYSIS, ROUTINE W REFLEX MICROSCOPIC
Glucose, UA: NEGATIVE
Leukocytes, UA: NEGATIVE
Nitrite: NEGATIVE
Specific Gravity, Urine: <1.005 — ABNORMAL LOW
pH: 7

## 2011-09-16 LAB — POCT I-STAT, CHEM 8
BUN: 6
Calcium, Ion: 1.03 — ABNORMAL LOW
Chloride: 109
Glucose, Bld: 77
HCT: 37
TCO2: 26

## 2011-09-16 LAB — URINE MICROSCOPIC-ADD ON

## 2011-09-17 LAB — URINE CULTURE

## 2011-09-17 LAB — CBC
HCT: 41.9
Hemoglobin: 14.2
MCHC: 33.8
Platelets: 223
RDW: 14.2

## 2011-09-17 LAB — COMPREHENSIVE METABOLIC PANEL
Albumin: 4.2
BUN: 10
Calcium: 9.3
Creatinine, Ser: 0.48
Glucose, Bld: 90
Total Protein: 7

## 2011-09-17 LAB — URINALYSIS, ROUTINE W REFLEX MICROSCOPIC
Glucose, UA: NEGATIVE
Ketones, ur: NEGATIVE
Protein, ur: NEGATIVE
pH: 5.5

## 2011-09-17 LAB — DIFFERENTIAL
Basophils Relative: 0
Lymphocytes Relative: 32
Lymphs Abs: 2.7
Monocytes Absolute: 0.4
Monocytes Relative: 5
Neutro Abs: 5.3
Neutrophils Relative %: 62

## 2011-09-17 LAB — PROTIME-INR
INR: 0.9
Prothrombin Time: 12.5

## 2011-09-17 LAB — APTT: aPTT: 29

## 2012-03-28 ENCOUNTER — Emergency Department (HOSPITAL_COMMUNITY): Payer: Self-pay

## 2012-03-28 ENCOUNTER — Emergency Department (HOSPITAL_COMMUNITY)
Admission: EM | Admit: 2012-03-28 | Discharge: 2012-03-29 | Disposition: A | Discharge status: Home / Self Care | Payer: Self-pay | Attending: Emergency Medicine | Admitting: Emergency Medicine

## 2012-03-28 ENCOUNTER — Encounter (HOSPITAL_COMMUNITY): Payer: Self-pay | Admitting: *Deleted

## 2012-03-28 DIAGNOSIS — R1310 Dysphagia, unspecified: Secondary | ICD-10-CM | POA: Insufficient documentation

## 2012-03-28 DIAGNOSIS — R221 Localized swelling, mass and lump, neck: Secondary | ICD-10-CM | POA: Insufficient documentation

## 2012-03-28 DIAGNOSIS — M542 Cervicalgia: Secondary | ICD-10-CM | POA: Insufficient documentation

## 2012-03-28 DIAGNOSIS — F17200 Nicotine dependence, unspecified, uncomplicated: Secondary | ICD-10-CM | POA: Insufficient documentation

## 2012-03-28 DIAGNOSIS — R220 Localized swelling, mass and lump, head: Secondary | ICD-10-CM | POA: Insufficient documentation

## 2012-03-28 DIAGNOSIS — J029 Acute pharyngitis, unspecified: Secondary | ICD-10-CM | POA: Insufficient documentation

## 2012-03-28 HISTORY — DX: Irritable bowel syndrome, unspecified: K58.9

## 2012-03-28 HISTORY — DX: Unspecified temporomandibular joint disorder, unspecified side: M26.609

## 2012-03-28 LAB — DIFFERENTIAL
Basophils Absolute: 0 10*3/uL (ref 0.0–0.1)
Lymphocytes Relative: 21 % (ref 12–46)
Lymphs Abs: 3 10*3/uL (ref 0.7–4.0)
Neutro Abs: 10.2 10*3/uL — ABNORMAL HIGH (ref 1.7–7.7)
Neutrophils Relative %: 71 % (ref 43–77)

## 2012-03-28 LAB — CBC
HCT: 41 % (ref 36.0–46.0)
MCH: 32.5 pg (ref 26.0–34.0)
MCV: 95.8 fL (ref 78.0–100.0)
RDW: 13.1 % (ref 11.5–15.5)
WBC: 14.4 10*3/uL — ABNORMAL HIGH (ref 4.0–10.5)

## 2012-03-28 LAB — BASIC METABOLIC PANEL
CO2: 29 mEq/L (ref 19–32)
Chloride: 97 mEq/L (ref 96–112)
Glucose, Bld: 105 mg/dL — ABNORMAL HIGH (ref 70–99)
Potassium: 3.5 mEq/L (ref 3.5–5.1)
Sodium: 136 mEq/L (ref 135–145)

## 2012-03-28 MED ORDER — ONDANSETRON HCL 4 MG/2ML IJ SOLN
4.0000 mg | Freq: Once | INTRAMUSCULAR | Status: AC
  Administered 2012-03-28: 4 mg via INTRAVENOUS
  Filled 2012-03-28: qty 2

## 2012-03-28 MED ORDER — SODIUM CHLORIDE 0.9 % IV BOLUS (SEPSIS)
500.0000 mL | Freq: Once | INTRAVENOUS | Status: AC
  Administered 2012-03-28: 500 mL via INTRAVENOUS

## 2012-03-28 MED ORDER — MORPHINE SULFATE 4 MG/ML IJ SOLN
4.0000 mg | Freq: Once | INTRAMUSCULAR | Status: AC
  Administered 2012-03-28: 4 mg via INTRAVENOUS
  Filled 2012-03-28: qty 1

## 2012-03-28 NOTE — ED Notes (Signed)
Left earache since yesterday and today with knot and swelling to throat, difficult to swallow

## 2012-03-28 NOTE — ED Notes (Signed)
Labs collected with IV start 

## 2012-03-29 ENCOUNTER — Encounter (HOSPITAL_COMMUNITY): Payer: Self-pay | Admitting: Emergency Medicine

## 2012-03-29 LAB — TSH: TSH: 0.973 u[IU]/mL (ref 0.350–4.500)

## 2012-03-29 MED ORDER — DEXTROSE 5 % IV SOLN
900.0000 mg | Freq: Once | INTRAVENOUS | Status: AC
  Administered 2012-03-29: 900 mg via INTRAVENOUS
  Filled 2012-03-29: qty 6

## 2012-03-29 MED ORDER — PENICILLIN V POTASSIUM 500 MG PO TABS: 500.0000 mg | ORAL_TABLET | Freq: Four times a day (QID) | ORAL | Status: AC

## 2012-03-29 MED ORDER — MORPHINE SULFATE 2 MG/ML IJ SOLN
2.0000 mg | Freq: Once | INTRAMUSCULAR | Status: AC
  Administered 2012-03-29: 2 mg via INTRAVENOUS
  Filled 2012-03-29: qty 1

## 2012-03-29 MED ORDER — CLINDAMYCIN PHOSPHATE 900 MG/50ML IV SOLN
INTRAVENOUS | Status: AC
  Filled 2012-03-29: qty 50

## 2012-03-29 NOTE — ED Provider Notes (Signed)
History     CSN: [id number]  Arrival date & time 03/28/12  2053   First MD Initiated Contact with Patient 03/28/12 2225      Chief Complaint  Patient presents with  . Otalgia  . Sore Throat    (Consider location/radiation/quality/duration/timing/severity/associated sxs/prior treatment) HPI Comments: Patient c/o sudden onset of pain to her left ear that began yesterday.  States that today the pain has seem to spread to her left neck and throat.  C/o of pain and difficulty swallowing.  She denies fever, recent illness, headache, dizziness or swelling of her tongue.    Patient is a 56 y.o. female presenting with ear pain and pharyngitis. The history is provided by the patient.  Otalgia This is a new problem. The current episode started yesterday. There is pain in the left ear. The problem occurs constantly. The problem has been gradually worsening. There has been no fever. The pain is moderate. Associated symptoms include sore throat and neck pain. Pertinent negatives include no ear discharge, no headaches, no rhinorrhea, no abdominal pain, no diarrhea, no vomiting, no cough and no rash. Associated symptoms comments: Pain with swallowing, left ear pain.  Sore Throat Associated symptoms include neck pain and a sore throat. Pertinent negatives include no abdominal pain, congestion, coughing, fever, headaches, rash or vomiting.    Past Medical History  Diagnosis Date  . IBS (irritable bowel syndrome)   . Temporomandibular joint disorder (TMJ)     age 89 fractured TMJ - repaired by oral surgeon    Past Surgical History  Procedure Date  . Cholecystectomy     History reviewed. No pertinent family history.  History  Substance Use Topics  . Smoking status: Current Everyday Smoker -- 0.5 packs/day    Types: Cigarettes  . Smokeless tobacco: Not on file  . Alcohol Use: No    OB History    Grav Para Term Preterm Abortions TAB SAB Ect Mult Living                  Review of  Systems  Constitutional: Negative for fever, activity change and appetite change.  HENT: Positive for ear pain, sore throat, trouble swallowing and neck pain. Negative for congestion, facial swelling, rhinorrhea, drooling, voice change and ear discharge.   Respiratory: Negative for cough.   Gastrointestinal: Negative for vomiting, abdominal pain and diarrhea.  Skin: Negative for rash.  Neurological: Negative for headaches.  All other systems reviewed and are negative.    Allergies  Benadryl; Compazine; and Epinephrine  Home Medications  No current outpatient prescriptions on file.  BP 127/50  Pulse 68  Temp(Src) 98 F (36.7 C) (Oral)  Resp 16  Ht 5\' 1"  (1.549 m)  Wt 99 lb (44.906 kg)  BMI 18.71 kg/m2  SpO2 97%  Physical Exam  Nursing note and vitals reviewed. Constitutional: She is oriented to person, place, and time. She appears well-developed and well-nourished. No distress.  HENT:  Head: Normocephalic and atraumatic. No trismus in the jaw.  Right Ear: Tympanic membrane and ear canal normal.  Left Ear: Tympanic membrane and ear canal normal.  Mouth/Throat: Mucous membranes are normal. No oral lesions. No dental abscesses, uvula swelling or lacerations.       Mild peritonsillar swelling to the left.  No erythema of the oropharynx or exudates.  Sublingual area appears nml.    Neck: Normal range of motion and phonation normal. Neck supple. No rigidity. Thyromegaly present.  Cardiovascular: Normal rate, regular rhythm and normal heart sounds.  No murmur heard. Pulmonary/Chest: Effort normal and breath sounds normal. No respiratory distress.  Musculoskeletal: Normal range of motion. She exhibits no edema and no tenderness.  Lymphadenopathy:    She has no cervical adenopathy.  Neurological: She is alert and oriented to person, place, and time. She exhibits normal muscle tone. Coordination normal.  Skin: Skin is warm and dry.    ED Course  Procedures (including critical  care time)  Labs Reviewed  CBC - Abnormal; Notable for the following:    WBC 14.4 (*)    All other components within normal limits  DIFFERENTIAL - Abnormal; Notable for the following:    Neutro Abs 10.2 (*)    Monocytes Absolute 1.2 (*)    All other components within normal limits  BASIC METABOLIC PANEL - Abnormal; Notable for the following:    Glucose, Bld 105 (*)    All other components within normal limits  TSH    Ct Soft Tissue Neck W Contrast  03/29/2012  *RADIOLOGY REPORT*  Clinical Data: Left neck swelling and dysphagia.  CT NECK WITH CONTRAST  Technique:  Multidetector CT imaging of the neck was performed with intravenous contrast.  Contrast:  75 ml Omnipaque-300  Comparison: None.  Findings: A poorly defined heterogeneous and the enhancing lesion is seen in the left palatine tonsil which measures approximately 1.0 x 1.7 cm.  There is mild mass effect on the oropharynx, without evidence of airway compromise. This is suspicious for a tonsillar phlegmon, although neoplasm cannot be excluded. An 11 mm enhancing left upper internal jugular lymph node is seen, without evidence of necrosis.  No other pathologically enlarged nodes identified.  The nasopharynx and epiglottis are normal appearance.  Larynx appears normal. The salivary glands and thyroid are normal in appearance.  IMPRESSION:  1.  1.7 cm ill-defined enhancing lesion in the left palatine tonsil.  This is suspicious for a tonsillar phlegmon, although neoplasm cannot definitely be excluded.  Post-treatment follow-up by CT is recommended to confirm resolution. 2.  11 mm enhancing left upper internal jugular lymph node, likely reactive although a neoplastic etiology cannot definitely be excluded. This can also be reevaluated on follow-up CT.  Original Report Authenticated By: Danae Orleans, M.D.      TSH is pending   MDM     TREATMENT IN THE ED: IV morphine, Zofran, and IV Clindamycin  CT scan and lab studies were reviewed by  me.    Patient has swelling to the left peritonsillar area and left neck.  Patient is alert, NAD.  Vitals stable, Non-toxic appearing. Discomfort with swallowing but handles her secretions well.  No facial swelling or trismus.  Thyromegaly present.    Patient's symptoms are possibly related to early developing abscess.  I have discussed the findings with the patient and she agrees to return here on Monday (2 days) for recheck and I will also give her referral for ENT, Dr. Suszanne Conners.    PRESCRIPTIONS GIVEN AT DISCHARGE: pen VK, (pt has pain medications as home).     Patient / Family / Caregiver understand and agree with initial ED impression and plan with expectations set for ED visit. Pt stable in ED with no significant deterioration in condition. Pt feels improved after observation and/or treatment in ED.           Donell Sliwinski L. Lashena Signer, PA 03/29/12 0200

## 2012-03-29 NOTE — Discharge Instructions (Signed)
 RESOURCE GUIDE  Dental Problems  Patients with Medicaid: [facility]                     [facility] [address]                                           [address] Phone:  [phone]                                                  Phone:  [phone]  If unable to pay or uninsured, contact:  [facility] or [facility] to become qualified for the adult dental clinic.  Chronic Pain Problems Contact [facility]  [phone] Patients need to be referred by their primary care doctor.  Insufficient Money for Medicine Contact [employer]:  call "211" or [facility] 271-5999.  No Primary Care Doctor Call [facility]  832-8000 Other agencies that provide inexpensive medical care    [facility]  [phone]     Internal Medicine  [phone]    [facility]  [phone]    Women's Clinic  [phone]    [facility]  [phone]    [facility]  [phone]  Psychological Services [facility]  [phone] [facility]  [phone] [facility]   800 853-5163 (emergency services [phone])  Substance Abuse Resources [facility]  336-882-2125 [facility] 336-784-9470 [facility] 336-285-9073 [facility] 336-845-3988 Residential & Outpatient Substance Abuse Program  800-659-3381  Abuse/Neglect [place] Child Abuse Hotline (336) 641-3795 [place] Child Abuse Hotline 800-378-5315 (After Hours)  Emergency Shelter [facility] (336) 271-5985  [facility] (336) 275-9566 [facility] (704) 372-4663  MRSA Hotline #:   832-7006    [place] [facility]  [facility] [place]     [employer]                          [facility] [address] [place]                       [address]      [address]                                                  [place]                            [place] Phone:  [phone]                                   Phone:  [phone]                 Phone:  [phone]  [facility] Phone:    [phone]  [place] Child Abuse Hotline (336) 342-1394 (336) 342-3537 (After Hours)   

## 2012-03-29 NOTE — ED Notes (Signed)
Tearful - states pain has returned and is worse than earlier.  Medicated

## 2012-03-29 NOTE — ED Provider Notes (Signed)
Medical screening examination/treatment/procedure(s) were conducted as a shared visit with non-physician practitioner(s) and myself.  I personally evaluated the patient during the encounter  Patient seen by me. Patient with fullness to the left side of her neck and patient feels like it difficult to swallow CT with IV contrast will be done to rule out abscess. That CT showed questionable phlegmon to the tonsillar area no evidence of an abscess. Patient will receive a IV clindamycin here be sent home with either clindamycin or penicillin depending on what she can afford and referral to your nose and throat however in case a peritonsillar abscesses developing patient will return for recheck in the emergency department in 24 hours.  Patient is nontoxic patient able to swallow saliva fine.  Shelda Jakes, MD 03/29/12 [phone]

## 2012-03-29 NOTE — ED Provider Notes (Signed)
Medical screening examination/treatment/procedure(s) were conducted as a shared visit with non-physician practitioner(s) and myself.  I personally evaluated the patient during the encounter   Shelda Jakes, MD 03/29/12 [id number]

## 2012-03-29 NOTE — ED Notes (Signed)
Med [place] - not in pyxis - AC notified of need

## 2012-03-30 ENCOUNTER — Encounter (HOSPITAL_COMMUNITY): Payer: Self-pay | Admitting: *Deleted

## 2012-03-30 ENCOUNTER — Emergency Department (HOSPITAL_COMMUNITY)
Admission: EM | Admit: 2012-03-30 | Discharge: 2012-03-30 | Disposition: A | Discharge status: Home / Self Care | Payer: Self-pay | Attending: Emergency Medicine | Admitting: Emergency Medicine

## 2012-03-30 DIAGNOSIS — J029 Acute pharyngitis, unspecified: Secondary | ICD-10-CM

## 2012-03-30 DIAGNOSIS — F17200 Nicotine dependence, unspecified, uncomplicated: Secondary | ICD-10-CM | POA: Insufficient documentation

## 2012-03-30 DIAGNOSIS — Z4801 Encounter for change or removal of surgical wound dressing: Secondary | ICD-10-CM | POA: Insufficient documentation

## 2012-03-30 DIAGNOSIS — K589 Irritable bowel syndrome without diarrhea: Secondary | ICD-10-CM | POA: Insufficient documentation

## 2012-03-30 NOTE — ED Provider Notes (Signed)
History    This chart was scribed for Breanna Quarry, MD, MD by [name]. The patient was seen in room [facility]08 and the patient's care was started at 8:58PM.   CSN: [id number]  Arrival date & time 03/30/12  1735   First MD Initiated Contact with Patient 03/30/12 2048      Chief Complaint  Patient presents with  . Wound Check    (Consider location/radiation/quality/duration/timing/severity/associated sxs/prior treatment) Patient is a 56 y.o. female presenting with wound check. The history is provided by the patient.  Wound Check    [name] is a 56 y.o. female who presents to the Emergency Department due to sore throat and thyroid recheck. Pt reports that she was in ED Friday due to "piercing" earache and trouble swallowing. She was diagnosed with abscess or growth around thyroid. She states that in 11/2011 her PCP noticed a nodule on her thyroid with negative lab results. She states that symptoms have improved  But she still has trouble swallowing. There is no radiation of pain. She had fever Friday night. She states she has GI complications. She takes valium due to menopause and hydrocodone daily. Pt is a smoker. Denies drinking alcohol. Pt reports having TMJ.    Past Medical History  Diagnosis Date  . IBS (irritable bowel syndrome)   . Temporomandibular joint disorder (TMJ)     age 50 fractured TMJ - repaired by oral surgeon    Past Surgical History  Procedure Date  . Cholecystectomy   . Eye surgery   . Fracture surgery     History reviewed. No pertinent family history.  History  Substance Use Topics  . Smoking status: Current Everyday Smoker -- 0.5 packs/day    Types: Cigarettes  . Smokeless tobacco: Not on file  . Alcohol Use: No    OB History    Grav Para Term Preterm Abortions TAB SAB Ect Mult Living                  Review of Systems  All other systems reviewed and are negative.   10 Systems reviewed and all are negative for acute change except  as noted in the HPI.   Allergies  Benadryl; Compazine; and Epinephrine  Home Medications   Current Outpatient Rx  Name Route Sig Dispense Refill  . PENICILLIN V POTASSIUM 500 MG PO TABS Oral Take 1 tablet (500 mg total) by mouth 4 (four) times daily. 40 tablet 0    BP 135/65  Pulse 83  Temp(Src) 98 F (36.7 C) (Oral)  Resp 16  Ht 5\' 1"  (1.549 m)  Wt 99 lb (44.906 kg)  BMI 18.71 kg/m2  SpO2 100%  Physical Exam  Nursing note and vitals reviewed. Constitutional: She is oriented to person, place, and time. She appears well-developed and well-nourished.  HENT:  Head: Normocephalic and atraumatic.  Mouth/Throat: Oropharynx is clear and moist.  Eyes: Conjunctivae are normal. Pupils are equal, round, and reactive to light.  Cardiovascular: Normal rate and regular rhythm.   Pulmonary/Chest: Effort normal and breath sounds normal. No respiratory distress.  Abdominal: Soft. She exhibits no distension.  Neurological: She is alert and oriented to person, place, and time.  Skin: Skin is warm and dry.  Psychiatric: She has a normal mood and affect. Her behavior is normal.    ED Course  Procedures (including critical care time) DIAGNOSTIC STUDIES: Oxygen Saturation is 100% on room air, normal by my interpretation.    COORDINATION OF CARE: 9:05PM EDP  discusses pt ED treatment course with pt.    Labs Reviewed - No data to display Ct Soft Tissue Neck W Contrast  03/29/2012  *RADIOLOGY REPORT*  Clinical Data: Left neck swelling and dysphagia.  CT NECK WITH CONTRAST  Technique:  Multidetector CT imaging of the neck was performed with intravenous contrast.  Contrast:  75 ml Omnipaque-300  Comparison: None.  Findings: A poorly defined heterogeneous and the enhancing lesion is seen in the left palatine tonsil which measures approximately 1.0 x 1.7 cm.  There is mild mass effect on the oropharynx, without evidence of airway compromise. This is suspicious for a tonsillar phlegmon, although  neoplasm cannot be excluded. An 11 mm enhancing left upper internal jugular lymph node is seen, without evidence of necrosis.  No other pathologically enlarged nodes identified.  The nasopharynx and epiglottis are normal appearance.  Larynx appears normal. The salivary glands and thyroid are normal in appearance.  IMPRESSION:  1.  1.7 cm ill-defined enhancing lesion in the left palatine tonsil.  This is suspicious for a tonsillar phlegmon, although neoplasm cannot definitely be excluded.  Post-treatment follow-up by CT is recommended to confirm resolution. 2.  11 mm enhancing left upper internal jugular lymph node, likely reactive although a neoplastic etiology cannot definitely be excluded. This can also be reevaluated on follow-up CT.  Original Report Authenticated By: [name], M.D.     No diagnosis found.    MDM  Patient without fevers today. Vital signs are normal. Oropharynx exam is normal. Patient has been taking her penicillin as prescribed. She is taking by mouth but is not taking it as usual. She does not appear to be dehydrated by her vital signs. [name] is also rechecking her to see if it appears to be any different from Saturday evening. She did have some abnormalities on her CAT scan but require followup. She is advised to followup with Dr. Christain Bruce.  I personally performed the services described in this documentation, which was scribed in my presence. The recorded information has been reviewed and considered.         Breanna Quarry, MD 03/30/12 2112

## 2012-03-30 NOTE — ED Notes (Signed)
Seen here for sore throat, and dx with thyroid problem Here for recheck.

## 2012-03-30 NOTE — Discharge Instructions (Signed)
Please call Dr. Avel Sensor office for follow up tomorrow.  Please ask him to review your ct scan.

## 2012-04-16 ENCOUNTER — Ambulatory Visit (INDEPENDENT_AMBULATORY_CARE_PROVIDER_SITE_OTHER): Payer: Self-pay | Admitting: Otolaryngology

## 2012-04-16 DIAGNOSIS — J36 Peritonsillar abscess: Secondary | ICD-10-CM

## 2012-04-16 DIAGNOSIS — H9209 Otalgia, unspecified ear: Secondary | ICD-10-CM

## 2015-05-09 ENCOUNTER — Emergency Department (HOSPITAL_COMMUNITY)
Admission: EM | Admit: 2015-05-09 | Discharge: 2015-05-09 | Disposition: A | Discharge status: Home / Self Care | Payer: Self-pay | Attending: Emergency Medicine | Admitting: Emergency Medicine

## 2015-05-09 ENCOUNTER — Encounter (HOSPITAL_COMMUNITY): Payer: Self-pay | Admitting: Cardiology

## 2015-05-09 ENCOUNTER — Emergency Department (HOSPITAL_COMMUNITY): Payer: Self-pay

## 2015-05-09 ENCOUNTER — Emergency Department (HOSPITAL_COMMUNITY): Payer: No Typology Code available for payment source

## 2015-05-09 DIAGNOSIS — Z8719 Personal history of other diseases of the digestive system: Secondary | ICD-10-CM | POA: Insufficient documentation

## 2015-05-09 DIAGNOSIS — M25551 Pain in right hip: Secondary | ICD-10-CM

## 2015-05-09 DIAGNOSIS — S79911A Unspecified injury of right hip, initial encounter: Secondary | ICD-10-CM | POA: Insufficient documentation

## 2015-05-09 DIAGNOSIS — Y92410 Unspecified street and highway as the place of occurrence of the external cause: Secondary | ICD-10-CM | POA: Insufficient documentation

## 2015-05-09 DIAGNOSIS — Z79899 Other long term (current) drug therapy: Secondary | ICD-10-CM | POA: Insufficient documentation

## 2015-05-09 DIAGNOSIS — S39012A Strain of muscle, fascia and tendon of lower back, initial encounter: Secondary | ICD-10-CM | POA: Insufficient documentation

## 2015-05-09 DIAGNOSIS — Y998 Other external cause status: Secondary | ICD-10-CM | POA: Insufficient documentation

## 2015-05-09 DIAGNOSIS — Y9389 Activity, other specified: Secondary | ICD-10-CM | POA: Insufficient documentation

## 2015-05-09 DIAGNOSIS — Z720 Tobacco use: Secondary | ICD-10-CM | POA: Insufficient documentation

## 2015-05-09 MED ORDER — TRAMADOL HCL 50 MG PO TABS: 50.0000 mg | ORAL_TABLET | Freq: Four times a day (QID) | ORAL | Status: DC | PRN

## 2015-05-09 MED ORDER — TRAMADOL HCL 50 MG PO TABS
50.0000 mg | ORAL_TABLET | Freq: Once | ORAL | Status: AC
  Administered 2015-05-09: 50 mg via ORAL
  Filled 2015-05-09: qty 1
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
## 2017-04-16 ENCOUNTER — Emergency Department (HOSPITAL_COMMUNITY): Payer: Self-pay

## 2017-04-16 ENCOUNTER — Encounter (HOSPITAL_COMMUNITY): Payer: Self-pay | Admitting: *Deleted

## 2017-04-16 ENCOUNTER — Emergency Department (HOSPITAL_COMMUNITY)
Admission: EM | Admit: 2017-04-16 | Discharge: 2017-04-16 | Disposition: A | Discharge status: Home / Self Care | Payer: Self-pay | Attending: Emergency Medicine | Admitting: Emergency Medicine

## 2017-04-16 DIAGNOSIS — Z79899 Other long term (current) drug therapy: Secondary | ICD-10-CM | POA: Insufficient documentation

## 2017-04-16 DIAGNOSIS — F17210 Nicotine dependence, cigarettes, uncomplicated: Secondary | ICD-10-CM | POA: Insufficient documentation

## 2017-04-16 DIAGNOSIS — J441 Chronic obstructive pulmonary disease with (acute) exacerbation: Secondary | ICD-10-CM | POA: Insufficient documentation

## 2017-04-16 LAB — COMPREHENSIVE METABOLIC PANEL
ALK PHOS: 81 U/L (ref 38–126)
ALT: 35 U/L (ref 14–54)
AST: 33 U/L (ref 15–41)
Albumin: 3.9 g/dL (ref 3.5–5.0)
Anion gap: 8 (ref 5–15)
BUN: 10 mg/dL (ref 6–20)
CALCIUM: 9.4 mg/dL (ref 8.9–10.3)
CHLORIDE: 104 mmol/L (ref 101–111)
CO2: 25 mmol/L (ref 22–32)
CREATININE: 0.78 mg/dL (ref 0.44–1.00)
Glucose, Bld: 93 mg/dL (ref 65–99)
Potassium: 3.6 mmol/L (ref 3.5–5.1)
SODIUM: 137 mmol/L (ref 135–145)
Total Bilirubin: 0.3 mg/dL (ref 0.3–1.2)
Total Protein: 7.2 g/dL (ref 6.5–8.1)

## 2017-04-16 LAB — CBC WITH DIFFERENTIAL/PLATELET
Basophils Absolute: 0 10*3/uL (ref 0.0–0.1)
Basophils Relative: 0 %
EOS ABS: 0.5 10*3/uL (ref 0.0–0.7)
EOS PCT: 4 %
HCT: 42.3 % (ref 36.0–46.0)
Hemoglobin: 14.1 g/dL (ref 12.0–15.0)
LYMPHS ABS: 3.5 10*3/uL (ref 0.7–4.0)
LYMPHS PCT: 31 %
MCH: 31.7 pg (ref 26.0–34.0)
MCHC: 33.3 g/dL (ref 30.0–36.0)
MCV: 95.1 fL (ref 78.0–100.0)
MONOS PCT: 5 %
Monocytes Absolute: 0.5 10*3/uL (ref 0.1–1.0)
Neutro Abs: 6.6 10*3/uL (ref 1.7–7.7)
Neutrophils Relative %: 60 %
PLATELETS: 265 10*3/uL (ref 150–400)
RBC: 4.45 MIL/uL (ref 3.87–5.11)
RDW: 13.6 % (ref 11.5–15.5)
WBC: 11.1 10*3/uL — ABNORMAL HIGH (ref 4.0–10.5)

## 2017-04-16 MED ORDER — HYDROCOD POLST-CPM POLST ER 10-8 MG/5ML PO SUER: 5.0000 mL | Freq: Two times a day (BID) | ORAL | 0 refills | Status: DC | PRN

## 2017-04-16 MED ORDER — METHYLPREDNISOLONE SODIUM SUCC 125 MG IJ SOLR
125.0000 mg | Freq: Once | INTRAMUSCULAR | Status: AC
  Administered 2017-04-16: 125 mg via INTRAVENOUS
  Filled 2017-04-16: qty 2

## 2017-04-16 MED ORDER — IOPAMIDOL (ISOVUE-300) INJECTION 61%
75.0000 mL | Freq: Once | INTRAVENOUS | Status: AC | PRN
  Administered 2017-04-16: 75 mL via INTRAVENOUS

## 2017-04-16 MED ORDER — LEVALBUTEROL HCL 1.25 MG/0.5ML IN NEBU
1.2500 mg | INHALATION_SOLUTION | Freq: Once | RESPIRATORY_TRACT | Status: AC
  Administered 2017-04-16: 1.25 mg via RESPIRATORY_TRACT
  Filled 2017-04-16: qty 0.5

## 2017-04-16 MED ORDER — SODIUM CHLORIDE 0.9 % IV BOLUS (SEPSIS)
1000.0000 mL | Freq: Once | INTRAVENOUS | Status: AC
  Administered 2017-04-16: 1000 mL via INTRAVENOUS

## 2017-04-16 MED ORDER — LEVALBUTEROL TARTRATE 45 MCG/ACT IN AERO: 2.0000 | INHALATION_SPRAY | Freq: Four times a day (QID) | RESPIRATORY_TRACT | 12 refills | Status: DC | PRN

## 2017-04-16 NOTE — Discharge Instructions (Signed)
Follow up with [place] [facility]

## 2017-04-16 NOTE — ED Provider Notes (Signed)
[facility]-EMERGENCY DEPT Provider Note   CSN: [id number] Arrival date & time: 04/16/17  1302     History   Chief Complaint Chief Complaint  Patient presents with  . Shortness of Breath    HPI [name] is a 61 y.o. female.  Patient complains of some shortness of breath for months. She's been put on steroids and antibiotics numerous times. Patient still complains of weakness and shortness of breath   The history is provided by the patient.  Shortness of Breath  This is a recurrent problem. The problem occurs continuously.The current episode started more than 1 week ago. The problem has not changed since onset.Pertinent negatives include no fever, no headaches, no cough, no chest pain, no abdominal pain and no rash. Precipitated by: Unknown.    Past Medical History:  Diagnosis Date  . IBS (irritable bowel syndrome)   . Temporomandibular joint disorder (TMJ)    age 31 fractured TMJ - repaired by oral surgeon    There are no active problems to display for this patient.   Past Surgical History:  Procedure Laterality Date  . CHOLECYSTECTOMY    . EYE SURGERY    . FRACTURE SURGERY      OB History    Gravida Para Term Preterm AB Living             0   SAB TAB Ectopic Multiple Live Births                   Home Medications    Prior to Admission medications   Medication Sig Start Date End Date Taking? Authorizing Provider  benzonatate (TESSALON) 100 MG capsule Take 100 mg by mouth 3 (three) times daily as needed for cough.   Yes Historical Provider, MD  cholecalciferol (VITAMIN D) 1000 UNITS tablet Take 3,000 Units by mouth daily.   Yes Historical Provider, MD  cyclobenzaprine (FLEXERIL) 10 MG tablet Take 10 mg by mouth 3 (three) times daily as needed. For TMJ   Yes Historical Provider, MD  diazepam (VALIUM) 10 MG tablet Take 10 mg by mouth 3 (three) times daily.   Yes Historical Provider, MD  fluticasone (FLONASE) 50 MCG/ACT nasal spray Place 2 sprays into both  nostrils daily.   Yes Historical Provider, MD  gabapentin (NEURONTIN) 300 MG capsule Take 300 mg by mouth 2 (two) times daily.   Yes Historical Provider, MD  HYDROcodone-acetaminophen (NORCO/VICODIN) 5-325 MG tablet Take 1 tablet by mouth 2 (two) times daily.    Yes Historical Provider, MD  ibuprofen (ADVIL,MOTRIN) 800 MG tablet Take 800 mg by mouth every 8 (eight) hours as needed.   Yes Historical Provider, MD  Multiple Vitamins-Minerals (MULTIVITAMIN GUMMIES ADULT PO) Take 2 each by mouth daily.   Yes Historical Provider, MD  Polyethyl Glycol-Propyl Glycol (SYSTANE) 0.4-0.3 % SOLN Apply 1-2 drops to eye daily as needed.   Yes Historical Provider, MD  sulfamethoxazole-trimethoprim (BACTRIM DS,SEPTRA DS) 800-160 MG tablet Take 1 tablet by mouth 2 (two) times daily.   Yes Historical Provider, MD  chlorpheniramine-HYDROcodone (TUSSIONEX PENNKINETIC ER) 10-8 MG/5ML SUER Take 5 mLs by mouth every 12 (twelve) hours as needed for cough. 04/16/17   Bethann Berkshire, MD  Cyanocobalamin (B-12) 5000 MCG SUBL Place 1 tablet under the tongue daily.    Historical Provider, MD  Ginger, Zingiber officinalis, (ANTI-NAUSEA GINGER) GUM Use as directed in the mouth or throat as needed. For nausea    Historical Provider, MD  HEXYLRESORCINOL, ANTISEPTIC, (SUCRETS) 2.4 MG LOZG Use as  directed 1 lozenge in the mouth or throat as needed. For sore throat    Historical Provider, MD  levalbuterol (XOPENEX HFA) 45 MCG/ACT inhaler Inhale 2 puffs into the lungs every 6 (six) hours as needed for wheezing. 04/16/17   Bethann Berkshire, MD  traMADol (ULTRAM) 50 MG tablet Take 1 tablet (50 mg total) by mouth every 6 (six) hours as needed. Patient not taking: Reported on 04/16/2017 05/09/15   Cathren Laine, MD    Family History No family history on file.  Social History Social History  Substance Use Topics  . Smoking status: Current Every Day Smoker    Packs/day: 0.50    Types: Cigarettes  . Smokeless tobacco: Never Used  . Alcohol use No       Allergies   Ceftin [cefuroxime axetil]; Tramadol; Benadryl [diphenhydramine hcl]; Compazine; and Epinephrine   Review of Systems Review of Systems  Constitutional: Negative for appetite change, fatigue and fever.  HENT: Negative for congestion, ear discharge and sinus pressure.   Eyes: Negative for discharge.  Respiratory: Positive for shortness of breath. Negative for cough.   Cardiovascular: Negative for chest pain.  Gastrointestinal: Negative for abdominal pain and diarrhea.  Genitourinary: Negative for frequency and hematuria.  Musculoskeletal: Negative for back pain.  Skin: Negative for rash.  Neurological: Negative for seizures and headaches.  Psychiatric/Behavioral: Negative for hallucinations.     Physical Exam Updated Vital Signs BP 126/77   Pulse 86   Temp 99.1 F (37.3 C) (Oral)   Resp 20   Ht  (1.549 m)   Wt 116 lb (52.6 kg)   SpO2 92%   BMI 21.92 kg/m   Physical Exam  Constitutional: She is oriented to person, place, and time. She appears well-developed.  HENT:  Head: Normocephalic.  Eyes: Conjunctivae and EOM are normal. No scleral icterus.  Neck: Neck supple. No thyromegaly present.  Cardiovascular: Normal rate and regular rhythm.  Exam reveals no gallop and no friction rub.   No murmur heard. Pulmonary/Chest: No stridor. She has wheezes. She has no rales. She exhibits no tenderness.  Abdominal: She exhibits no distension. There is no tenderness. There is no rebound.  Musculoskeletal: Normal range of motion. She exhibits no edema.  Lymphadenopathy:    She has no cervical adenopathy.  Neurological: She is oriented to person, place, and time. She exhibits normal muscle tone. Coordination normal.  Skin: No rash noted. No erythema.  Psychiatric: She has a normal mood and affect. Her behavior is normal.     ED Treatments / Results  Labs (all labs ordered are listed, but only abnormal results are displayed) Labs Reviewed  CBC WITH  DIFFERENTIAL/PLATELET - Abnormal; Notable for the following:       Result Value   WBC 11.1 (*)    All other components within normal limits  COMPREHENSIVE METABOLIC PANEL    EKG  EKG Interpretation None       Radiology Dg Chest 2 View  Result Date: 04/16/2017 CLINICAL DATA:  Sick for 8 months, multiple problems,weakness EXAM: CHEST  2 VIEW COMPARISON:  05/09/1959 FINDINGS: Normal mediastinum and cardiac silhouette. Hyperinflated lungs. Normal pulmonary vasculature. No evidence of effusion, infiltrate, or pneumothorax. Lateral projection there is a density projecting over the heart at the anterior lung base measuring 13 mm. This is not seen on comparison exam. Lesions not placed on the frontal exam. No acute bony abnormality. Lungs are hyperinflated. IMPRESSION: 1. Indeterminate lesion or pseudo lesion in the anterior chest. Recommend CT thorax for  further evaluation. 2. Hyperinflated lungs without acute findings otherwise. Electronically Signed   By: Genevive Bi M.D.   On: 04/16/2017 15:38   Ct Chest W Contrast  Result Date: 04/16/2017 CLINICAL DATA:  Follow-up for abnormal chest radiograph. Cough for 8 months. EXAM: CT CHEST WITH CONTRAST TECHNIQUE: Multidetector CT imaging of the chest was performed during intravenous contrast administration. CONTRAST:  75mL ISOVUE-300 IOPAMIDOL (ISOVUE-300) INJECTION 61% COMPARISON:  Current chest radiograph. FINDINGS: Cardiovascular: Heart is normal in size and configuration. There are minor coronary artery calcifications. The great vessels are normal in caliber. No aortic dissection or significant atherosclerosis. Mediastinum/Nodes: 4 mm right thyroid nodule. No neck base or axillary masses or enlarged lymph nodes. No mediastinal or hilar masses or adenopathy. Trachea and esophagus are unremarkable. Lungs/Pleura: Small area of chronic atelectasis or scarring at the base of the left upper lobe lingula accounts for the current chest radiographic opacity.  There are no lung masses or suspicious nodules. A calcified granuloma is noted in the right lower lobe. There is dependent opacity in both lower lobes that is most likely atelectasis. A component of pneumonia is possible. Remainder of the lungs is clear. There are mild changes of centrilobular emphysema. No pleural effusion or pneumothorax. Upper Abdomen: No acute abnormality. Musculoskeletal: No chest wall abnormality. No acute or significant osseous findings. IMPRESSION: 1. No evidence of malignancy. No lung mass or nodule. The opacity noted on the lateral view, at the anterior lung base, current chest radiograph is due to atelectasis or chronic scarring at the base of the left upper lobe lingula. 2. There is dependent opacity in both lower lobes that is most likely atelectasis. A component of pneumonia is possible. No other evidence of pneumonia. No pulmonary edema. 3. Mild changes of centrilobular emphysema. Electronically Signed   By: Amie Portland M.D.   On: 04/16/2017 19:30    Procedures Procedures (including critical care time)  Medications Ordered in ED Medications  sodium chloride 0.9 % bolus 1,000 mL (0 mLs Intravenous Stopped 04/16/17 1800)  methylPREDNISolone sodium succinate (SOLU-MEDROL) 125 mg/2 mL injection 125 mg (125 mg Intravenous Given 04/16/17 1608)  levalbuterol (XOPENEX) nebulizer solution 1.25 mg (1.25 mg Nebulization Given 04/16/17 1538)  iopamidol (ISOVUE-300) 61 % injection 75 mL (75 mLs Intravenous Contrast Given 04/16/17 1909)     Initial Impression / Assessment and Plan / ED Course  I have reviewed the triage vital signs and the nursing notes.  Pertinent labs & imaging results that were available during my care of the patient were reviewed by me and considered in my medical decision making (see chart for details).     Patient improved with Xopenex. She has no allergy to albuterol but did not have any problems with Xopenex. CT scan shows COPD. Patient will be discharged  with Xopenex inhaler and she wanted something for cough. She will be given some Tussionex. She is referred to pulmonary  Final Clinical Impressions(s) / ED Diagnoses   Final diagnoses:  COPD exacerbation (HCC)    New Prescriptions New Prescriptions   CHLORPHENIRAMINE-HYDROCODONE (TUSSIONEX PENNKINETIC ER) 10-8 MG/5ML SUER    Take 5 mLs by mouth every 12 (twelve) hours as needed for cough.   LEVALBUTEROL (XOPENEX HFA) 45 MCG/ACT INHALER    Inhale 2 puffs into the lungs every 6 (six) hours as needed for wheezing.     Bethann Berkshire, MD 04/16/17 2034

## 2017-04-16 NOTE — ED Triage Notes (Signed)
Sick for 8 months, multiple problems

## 2017-05-14 ENCOUNTER — Other Ambulatory Visit (HOSPITAL_COMMUNITY): Payer: Self-pay | Admitting: Emergency Medicine

## 2017-05-14 DIAGNOSIS — G459 Transient cerebral ischemic attack, unspecified: Secondary | ICD-10-CM

## 2017-05-16 ENCOUNTER — Ambulatory Visit (HOSPITAL_COMMUNITY): Payer: No Typology Code available for payment source

## 2017-05-22 ENCOUNTER — Ambulatory Visit (HOSPITAL_COMMUNITY)
Admission: RE | Admit: 2017-05-22 | Discharge: 2017-05-22 | Disposition: A | Discharge status: Home / Self Care | Payer: Self-pay | Source: Ambulatory Visit | Attending: Emergency Medicine | Admitting: Emergency Medicine

## 2017-05-22 DIAGNOSIS — G459 Transient cerebral ischemic attack, unspecified: Secondary | ICD-10-CM | POA: Insufficient documentation

## 2017-05-22 MED ORDER — GADOBENATE DIMEGLUMINE 529 MG/ML IV SOLN
10.0000 mL | Freq: Once | INTRAVENOUS | Status: AC | PRN
  Administered 2017-05-22: 10 mL via INTRAVENOUS

## 2017-08-25 ENCOUNTER — Ambulatory Visit: Payer: No Typology Code available for payment source

## 2017-09-22 ENCOUNTER — Ambulatory Visit: Payer: Self-pay

## 2017-11-03 ENCOUNTER — Ambulatory Visit: Payer: Self-pay | Attending: Oncology | Admitting: *Deleted

## 2017-11-03 ENCOUNTER — Encounter (INDEPENDENT_AMBULATORY_CARE_PROVIDER_SITE_OTHER): Payer: Self-pay

## 2017-11-03 ENCOUNTER — Encounter: Payer: Self-pay | Admitting: *Deleted

## 2017-11-03 ENCOUNTER — Ambulatory Visit
Admission: RE | Admit: 2017-11-03 | Discharge: 2017-11-03 | Disposition: A | Discharge status: Home / Self Care | Payer: Self-pay | Source: Ambulatory Visit | Attending: Oncology | Admitting: Oncology

## 2017-11-03 VITALS — BP 118/76 | HR 68 | Temp 97.7°F | Ht 63.0 in | Wt 111.0 lb

## 2017-11-03 DIAGNOSIS — Z0000 Encounter for general adult medical examination without abnormal findings: Secondary | ICD-10-CM
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
## 2017-11-03 NOTE — Patient Instructions (Signed)
Gave patient hand-out, Women Staying Healthy, Active and Well from [facility], with education on breast health, pap smears, heart and colon health. 

## 2017-11-04 ENCOUNTER — Other Ambulatory Visit: Payer: Self-pay | Admitting: *Deleted

## 2017-11-04 ENCOUNTER — Encounter: Payer: Self-pay | Admitting: *Deleted

## 2017-11-04 DIAGNOSIS — N63 Unspecified lump in unspecified breast: Secondary | ICD-10-CM

## 2017-11-06 LAB — SPECIMEN STATUS REPORT

## 2017-11-06 LAB — PAP LB AND HPV HIGH-RISK: PAP Smear Comment: 0

## 2017-11-06 LAB — HPV, LOW VOLUME (REFLEX): HPV low volume reflex: NEGATIVE

## 2017-11-20 ENCOUNTER — Ambulatory Visit
Admission: RE | Admit: 2017-11-20 | Discharge: 2017-11-20 | Disposition: A | Discharge status: Home / Self Care | Payer: Self-pay | Source: Ambulatory Visit | Attending: Oncology | Admitting: Oncology

## 2017-11-20 ENCOUNTER — Encounter: Payer: Self-pay | Admitting: *Deleted

## 2017-11-20 DIAGNOSIS — N63 Unspecified lump in unspecified breast: Secondary | ICD-10-CM

## 2017-11-20 NOTE — Progress Notes (Signed)
Letter mailed to inform patient of her normal mammogram and pap smear results.  Next mammo in 1 year and pap in 5 years.  [facility]IS to [place].

## 2018-05-04 ENCOUNTER — Emergency Department (HOSPITAL_COMMUNITY): Payer: Self-pay

## 2018-05-04 ENCOUNTER — Other Ambulatory Visit: Payer: Self-pay

## 2018-05-04 ENCOUNTER — Emergency Department (HOSPITAL_COMMUNITY)
Admission: EM | Admit: 2018-05-04 | Discharge: 2018-05-05 | Disposition: A | Discharge status: Home / Self Care | Payer: Self-pay | Attending: Emergency Medicine | Admitting: Emergency Medicine

## 2018-05-04 ENCOUNTER — Encounter (HOSPITAL_COMMUNITY): Payer: Self-pay | Admitting: *Deleted

## 2018-05-04 DIAGNOSIS — W07XXXA Fall from chair, initial encounter: Secondary | ICD-10-CM | POA: Insufficient documentation

## 2018-05-04 DIAGNOSIS — Y939 Activity, unspecified: Secondary | ICD-10-CM | POA: Insufficient documentation

## 2018-05-04 DIAGNOSIS — S0083XA Contusion of other part of head, initial encounter: Secondary | ICD-10-CM | POA: Insufficient documentation

## 2018-05-04 DIAGNOSIS — F17210 Nicotine dependence, cigarettes, uncomplicated: Secondary | ICD-10-CM | POA: Insufficient documentation

## 2018-05-04 DIAGNOSIS — Z79899 Other long term (current) drug therapy: Secondary | ICD-10-CM | POA: Insufficient documentation

## 2018-05-04 DIAGNOSIS — W19XXXA Unspecified fall, initial encounter: Secondary | ICD-10-CM

## 2018-05-04 DIAGNOSIS — Y999 Unspecified external cause status: Secondary | ICD-10-CM | POA: Insufficient documentation

## 2018-05-04 DIAGNOSIS — Y929 Unspecified place or not applicable: Secondary | ICD-10-CM | POA: Insufficient documentation

## 2018-05-04 MED ORDER — MORPHINE SULFATE (PF) 4 MG/ML IV SOLN
6.0000 mg | Freq: Once | INTRAVENOUS | Status: AC
  Administered 2018-05-05: 6 mg via INTRAMUSCULAR
  Filled 2018-05-04: qty 2

## 2018-05-04 MED ORDER — ONDANSETRON HCL 4 MG PO TABS
4.0000 mg | ORAL_TABLET | Freq: Once | ORAL | Status: AC
  Administered 2018-05-05: 4 mg via ORAL
  Filled 2018-05-04: qty 1

## 2018-05-04 NOTE — ED Triage Notes (Addendum)
Pt fell out of a chair last night and injured her right shoulder, arm and pelvic area.

## 2018-05-05 NOTE — ED Provider Notes (Signed)
[facility] EMERGENCY DEPARTMENT Provider Note   CSN: [id number] Arrival date & time: 05/04/18  2002     History   Chief Complaint Chief Complaint  Patient presents with  . Fall    HPI [name] is a 62 y.o. female.  Patient is a 62 year old female who presents to the emergency department with a complaint of pain of multiple sites following a fall.  The patient states that on yesterday May 19 she fell from a chair.  The chair then fell on her.  She complains now of pain involving the right shoulder the pelvis, as well as the left side of her face.  There was no loss of consciousness reported.  Patient states she tried conservative measures these were not successful.  She presents now for assistance with her pain and for evaluation for any possible fractures or dislocations.  It is of note that the patient sustained an injury in March 2019 in which she sustained injury again to multiple areas.  She states that this fall seemed to have aggravated some of those previous injuries as well.     Past Medical History:  Diagnosis Date  . IBS (irritable bowel syndrome)   . Temporomandibular joint disorder (TMJ)    age 86 fractured TMJ - repaired by oral surgeon    There are no active problems to display for this patient.   Past Surgical History:  Procedure Laterality Date  . CHOLECYSTECTOMY    . EYE SURGERY    . FRACTURE SURGERY       OB History    Gravida      Para      Term      Preterm      AB      Living  0     SAB      TAB      Ectopic      Multiple      Live Births               Home Medications    Prior to Admission medications   Medication Sig Start Date End Date Taking? Authorizing Provider  albuterol (PROVENTIL HFA;VENTOLIN HFA) 108 (90 Base) MCG/ACT inhaler Inhale 1-2 puffs into the lungs every 6 (six) hours as needed for wheezing or shortness of breath.   Yes [provider]  budesonide-formoterol (SYMBICORT) 160-4.5  MCG/ACT inhaler Inhale 2 puffs into the lungs 2 (two) times daily.   Yes [provider]  clonazePAM (KLONOPIN) 1 MG tablet Take 1 mg by mouth every 6 (six) hours as needed for anxiety.   Yes [provider]  fluticasone (FLONASE) 50 MCG/ACT nasal spray Place 2 sprays into both nostrils daily.   Yes [provider]  gabapentin (NEURONTIN) 300 MG capsule Take 600 mg by mouth 3 (three) times daily.    Yes [provider]  Gabapentin Enacarbil (HORIZANT) 600 MG TBCR Take 1 tablet by mouth daily.   Yes [provider]  omeprazole (PRILOSEC) 20 MG capsule Take 20 mg by mouth 2 (two) times daily.   Yes [provider]    Family History Family History  Problem Relation Age of Onset  . Breast cancer Neg Hx     Social History Social History   Tobacco Use  . Smoking status: Current Every Day Smoker    Packs/day: 0.50    Types: Cigarettes  . Smokeless tobacco: Never Used  Substance Use Topics  . Alcohol use: No  . Drug  use: No     Allergies   Ceftin [cefuroxime axetil]; Tramadol; Benadryl [diphenhydramine hcl]; Compazine; and Epinephrine   Review of Systems Review of Systems  Constitutional: Negative for activity change.       All ROS Neg except as noted in HPI  HENT: Negative for nosebleeds.   Eyes: Negative for photophobia and discharge.  Respiratory: Negative for cough, shortness of breath and wheezing.   Cardiovascular: Negative for chest pain and palpitations.  Gastrointestinal: Negative for abdominal pain and blood in stool.  Genitourinary: Negative for dysuria, frequency and hematuria.  Musculoskeletal: Positive for arthralgias. Negative for back pain and neck pain.  Skin: Negative.   Neurological: Negative for dizziness, seizures and speech difficulty.  Psychiatric/Behavioral: Negative for confusion and hallucinations.     Physical Exam Updated Vital Signs BP (!) 121/52 (BP Location: Left Arm)   Pulse 68   Temp  98.2 F (36.8 C) (Oral)   Resp 17   Ht  (1.549 m)   Wt 46.5 kg (102 lb 9.6 oz)   SpO2 95%   BMI 19.39 kg/m   Physical Exam  Constitutional: She is oriented to person, place, and time. She appears well-developed and well-nourished.  Non-toxic appearance.  HENT:  Head: Normocephalic.    Right Ear: Tympanic membrane and external ear normal.  Left Ear: Tympanic membrane and external ear normal.  Pain to left temporal area. No palpable deformity of mandible.   Eyes: Pupils are equal, round, and reactive to light. EOM and lids are normal.  Neck: Normal range of motion. Neck supple. Carotid bruit is not present.  Cardiovascular: Normal rate, regular rhythm, normal heart sounds, intact distal pulses and normal pulses.  Pulmonary/Chest: Breath sounds normal. No respiratory distress.  Abdominal: Soft. Bowel sounds are normal. There is no tenderness. There is no guarding.  Musculoskeletal: Normal range of motion.       Right shoulder: She exhibits pain.       Right upper arm: She exhibits tenderness.  There is no deformity of the right or left shoulder.  There is some soreness with range of motion particularly of the right shoulder.  Shoulder no deformity of the right upper extremity.  The radial pulse is 2+.  And the capillary refill is less than 2 seconds. There is pain with rocking of the pelvis and attempted range of motion.  Right arm is in a sling.  Lymphadenopathy:       Head (right side): No submandibular adenopathy present.       Head (left side): No submandibular adenopathy present.    She has no cervical adenopathy.  Neurological: She is alert and oriented to person, place, and time. She has normal strength. No cranial nerve deficit or sensory deficit.  Skin: Skin is warm and dry.  Psychiatric: She has a normal mood and affect. Her speech is normal.  Nursing note and vitals reviewed.    ED Treatments / Results  Labs (all labs ordered are listed, but only abnormal  results are displayed) Labs Reviewed - No data to display  EKG None  Radiology Dg Pelvis 1-2 Views  Result Date: 05/05/2018 CLINICAL DATA:  Fall from chair with right pelvic pain. EXAM: PELVIS - 1-2 VIEW COMPARISON:  None. FINDINGS: The cortical margins of the bony pelvis are intact. No fracture. Pubic symphysis and sacroiliac joints are congruent. Both femoral heads are well-seated in the respective acetabula. IMPRESSION: Negative radiograph of the pelvis. Electronically Signed   By: Lujean Rave.D.  On: 05/05/2018 00:55   Dg Shoulder Right  Result Date: 05/05/2018 CLINICAL DATA:  Fall from chair injuring right shoulder. Right shoulder pain. EXAM: RIGHT SHOULDER - 2+ VIEW COMPARISON:  None. FINDINGS: There is no evidence of fracture or dislocation. There is no evidence of arthropathy or other focal bone abnormality. Soft tissues are unremarkable. IMPRESSION: Negative radiographs of the right shoulder. Electronically Signed   By: Rubye Oaks M.D.   On: 05/05/2018 00:54    Procedures Procedures (including critical care time)  Medications Ordered in ED Medications  ondansetron (ZOFRAN) tablet 4 mg (4 mg Oral Given 05/05/18 0042)  morphine 4 MG/ML injection 6 mg (6 mg Intramuscular Given 05/05/18 0043)     Initial Impression / Assessment and Plan / ED Course  I have reviewed the triage vital signs and the nursing notes.  Pertinent labs & imaging results that were available during my care of the patient were reviewed by me and considered in my medical decision making (see chart for details).       Final Clinical Impressions(s) / ED Diagnoses MDM  X-ray of the pelvis and right shoulder pelvis as well as the right shoulder are negative for fracture or dislocation. CT scan of facial bones is negative for acute changes.  Patient notified of negative x-ray results.  Patient to follow-up with her primary physician concerning the bruising and injuries following the fall.   Patient will return to the emergency department if any emergent changes in condition, problems, or concerns.   Final diagnoses:  Contusion of face, initial encounter  Fall, initial encounter    ED Discharge Orders    None       Ivery Quale, PA-C 05/05/18 0124    Loren Racer, MD 05/08/18 [id number]

## 2018-05-05 NOTE — Discharge Instructions (Addendum)
Your x-rays are negative for fracture or dislocation.  Your vital signs within a safe range.  You may use Tylenol extra strength for any soreness.  Please discuss any changes in your condition with Dr.Kikel.

## 2018-08-05 ENCOUNTER — Emergency Department (HOSPITAL_COMMUNITY): Payer: Self-pay

## 2018-08-05 ENCOUNTER — Other Ambulatory Visit: Payer: Self-pay

## 2018-08-05 ENCOUNTER — Encounter (HOSPITAL_COMMUNITY): Payer: Self-pay | Admitting: Emergency Medicine

## 2018-08-05 ENCOUNTER — Emergency Department (HOSPITAL_COMMUNITY)
Admission: EM | Admit: 2018-08-05 | Discharge: 2018-08-05 | Disposition: A | Discharge status: Home / Self Care | Payer: Self-pay | Attending: Emergency Medicine | Admitting: Emergency Medicine

## 2018-08-05 DIAGNOSIS — Z79899 Other long term (current) drug therapy: Secondary | ICD-10-CM | POA: Insufficient documentation

## 2018-08-05 DIAGNOSIS — F17210 Nicotine dependence, cigarettes, uncomplicated: Secondary | ICD-10-CM | POA: Insufficient documentation

## 2018-08-05 DIAGNOSIS — N200 Calculus of kidney: Secondary | ICD-10-CM | POA: Insufficient documentation

## 2018-08-05 LAB — CBC WITH DIFFERENTIAL/PLATELET
BASOS PCT: 0 %
Basophils Absolute: 0 10*3/uL (ref 0.0–0.1)
EOS ABS: 0.4 10*3/uL (ref 0.0–0.7)
Eosinophils Relative: 4 %
HCT: 42.1 % (ref 36.0–46.0)
Hemoglobin: 14.4 g/dL (ref 12.0–15.0)
Lymphocytes Relative: 36 %
Lymphs Abs: 3 10*3/uL (ref 0.7–4.0)
MCH: 33.1 pg (ref 26.0–34.0)
MCHC: 34.2 g/dL (ref 30.0–36.0)
MCV: 96.8 fL (ref 78.0–100.0)
MONO ABS: 0.8 10*3/uL (ref 0.1–1.0)
MONOS PCT: 9 %
Neutro Abs: 4.3 10*3/uL (ref 1.7–7.7)
Neutrophils Relative %: 51 %
PLATELETS: 214 10*3/uL (ref 150–400)
RBC: 4.35 MIL/uL (ref 3.87–5.11)
RDW: 13.4 % (ref 11.5–15.5)
WBC: 8.5 10*3/uL (ref 4.0–10.5)

## 2018-08-05 LAB — BASIC METABOLIC PANEL
Anion gap: 7 (ref 5–15)
BUN: 19 mg/dL (ref 8–23)
CALCIUM: 9.1 mg/dL (ref 8.9–10.3)
CO2: 27 mmol/L (ref 22–32)
CREATININE: 0.57 mg/dL (ref 0.44–1.00)
Chloride: 104 mmol/L (ref 98–111)
GFR calc non Af Amer: >60 mL/min (ref >60)
Glucose, Bld: 93 mg/dL (ref 70–99)
Potassium: 3.7 mmol/L (ref 3.5–5.1)
SODIUM: 138 mmol/L (ref 135–145)

## 2018-08-05 LAB — URINALYSIS, ROUTINE W REFLEX MICROSCOPIC
BILIRUBIN URINE: NEGATIVE
Bacteria, UA: NONE SEEN
GLUCOSE, UA: NEGATIVE mg/dL
KETONES UR: NEGATIVE mg/dL
LEUKOCYTES UA: NEGATIVE
NITRITE: NEGATIVE
PH: 6 (ref 5.0–8.0)
Protein, ur: NEGATIVE mg/dL
Specific Gravity, Urine: 1.017 (ref 1.005–1.030)

## 2018-08-05 MED ORDER — HYDROCODONE-ACETAMINOPHEN 5-325 MG PO TABS: 1.0000 | ORAL_TABLET | Freq: Three times a day (TID) | ORAL | 0 refills | Status: DC | PRN

## 2018-08-05 MED ORDER — KETOROLAC TROMETHAMINE 30 MG/ML IJ SOLN
15.0000 mg | Freq: Once | INTRAMUSCULAR | Status: AC
  Administered 2018-08-05: 15 mg via INTRAVENOUS
  Filled 2018-08-05: qty 1

## 2018-08-05 MED ORDER — FENTANYL CITRATE (PF) 100 MCG/2ML IJ SOLN
25.0000 ug | Freq: Once | INTRAMUSCULAR | Status: AC
  Administered 2018-08-05: 25 ug via INTRAVENOUS
  Filled 2018-08-05: qty 2

## 2018-08-05 MED ORDER — SODIUM CHLORIDE 0.9 % IV BOLUS
1000.0000 mL | Freq: Once | INTRAVENOUS | Status: AC
  Administered 2018-08-05: 1000 mL via INTRAVENOUS

## 2018-08-05 NOTE — Discharge Instructions (Signed)
As discussed, your evaluation today has been largely reassuring.  But, it is important that you monitor your condition carefully, and do not hesitate to return to the ED if you develop new, or concerning changes in your condition. ? ?Otherwise, please follow-up with your physician for appropriate ongoing care. ? ?

## 2018-08-05 NOTE — ED Triage Notes (Signed)
Pt c/o sudden rlq pain radiating into right flank. Couldn't urinate earlier.

## 2018-08-05 NOTE — ED Provider Notes (Signed)
[facility] EMERGENCY DEPARTMENT Provider Note   CSN: [id number] Arrival date & time: 08/05/18  1832     History   Chief Complaint Chief Complaint  Patient presents with  . Flank Pain    HPI Lajoyce Lauberheresa D Span is a 62 y.o. female.  HPI Presents with concern of right inguinal pain, right low back pain. Pain began about 3 hours prior to ED arrival, she was in her usual state of health prior to this. She does note multiple recent falls, ongoing substantial musculoskeletal pain, but has had no similar pain until tonight. She notes that while urinating she felt the sudden onset of pain, and since that time it is been persistent in spite of OTC medication. Patient denies history of kidney stones, notes that she has multiple family members who do have this pathology.  Past Medical History:  Diagnosis Date  . IBS (irritable bowel syndrome)   . Temporomandibular joint disorder (TMJ)    age 62 fractured TMJ - repaired by oral surgeon    There are no active problems to display for this patient.   Past Surgical History:  Procedure Laterality Date  . CHOLECYSTECTOMY    . EYE SURGERY    . FRACTURE SURGERY       OB History    Gravida      Para      Term      Preterm      AB      Living  0     SAB      TAB      Ectopic      Multiple      Live Births               Home Medications    Prior to Admission medications   Medication Sig Start Date End Date Taking? Authorizing Provider  albuterol (PROVENTIL HFA;VENTOLIN HFA) 108 (90 Base) MCG/ACT inhaler Inhale 1-2 puffs into the lungs every 6 (six) hours as needed for wheezing or shortness of breath.   Yes [provider]  baclofen (LIORESAL) 10 MG tablet Take 10 mg by mouth 3 (three) times daily.  07/08/18  Yes [provider]  budesonide-formoterol (SYMBICORT) 160-4.5 MCG/ACT inhaler Inhale 2 puffs into the lungs 2 (two) times daily.   Yes [provider]  clonazePAM (KLONOPIN) 1 MG  tablet Take 1 mg by mouth every 6 (six) hours as needed for anxiety.   Yes [provider]  fluticasone (FLONASE) 50 MCG/ACT nasal spray Place 2 sprays into both nostrils daily.   Yes [provider]  gabapentin (NEURONTIN) 300 MG capsule Take 600 mg by mouth 3 (three) times daily.    Yes [provider]  Gabapentin Enacarbil (HORIZANT) 600 MG TBCR Take 1 tablet by mouth daily.   Yes [provider]  omeprazole (PRILOSEC) 20 MG capsule Take 20 mg by mouth daily as needed (for acid reflux).    Yes [provider]    Family History Family History  Problem Relation Age of Onset  . Breast cancer Neg Hx     Social History Social History   Tobacco Use  . Smoking status: Current Every Day Smoker    Packs/day: 0.50    Types: Cigarettes  . Smokeless tobacco: Never Used  Substance Use Topics  . Alcohol use: No  . Drug use: No     Allergies   Ceftin [cefuroxime axetil]; Tramadol; Benadryl [diphenhydramine hcl]; Compazine; and Epinephrine   Review of Systems Review  of Systems  Constitutional:       Per HPI, otherwise negative  HENT:       Per HPI, otherwise negative  Respiratory:       Per HPI, otherwise negative  Cardiovascular:       Per HPI, otherwise negative  Gastrointestinal: Positive for abdominal pain and nausea. Negative for vomiting.  Endocrine:       Negative aside from HPI  Genitourinary:       Neg aside from HPI   Musculoskeletal:       Per HPI, otherwise negative  Skin: Negative.   Neurological: Positive for weakness. Negative for syncope.     Physical Exam Updated Vital Signs BP (!) 91/56   Pulse (!) 50   Temp 97.7 F (36.5 C) (Oral)   Resp 18   Ht 5\' 1"  (1.549 m)   Wt 46.3 kg   SpO2 94%   BMI 19.27 kg/m   Physical Exam  Constitutional: She is oriented to person, place, and time. She appears well-developed and well-nourished. No distress.  Uncomfortable appearing frail female resting in right lateral  decubitus position  HENT:  Head: Normocephalic and atraumatic.  Eyes: Conjunctivae and EOM are normal.  Cardiovascular: Normal rate and regular rhythm.  Pulmonary/Chest: Effort normal and breath sounds normal. No stridor. No respiratory distress.  Abdominal: She exhibits no distension. There is tenderness. There is guarding.  Tenderness in the right inguinal crease  Musculoskeletal: She exhibits no edema.  Neurological: She is alert and oriented to person, place, and time. She displays atrophy. No cranial nerve deficit.  Skin: Skin is warm and dry.  Psychiatric: She has a normal mood and affect.  Nursing note and vitals reviewed.    ED Treatments / Results  Labs (all labs ordered are listed, but only abnormal results are displayed) Labs Reviewed  URINALYSIS, ROUTINE W REFLEX MICROSCOPIC - Abnormal; Notable for the following components:      Result Value   Hgb urine dipstick SMALL (*)    All other components within normal limits  CBC WITH DIFFERENTIAL/PLATELET  BASIC METABOLIC PANEL    EKG None  Radiology Ct Renal Stone Study  Result Date: 08/05/2018 CLINICAL DATA:  Right lower quadrant pain EXAM: CT ABDOMEN AND PELVIS WITHOUT CONTRAST TECHNIQUE: Multidetector CT imaging of the abdomen and pelvis was performed following the standard protocol without IV contrast. COMPARISON:  None. FINDINGS: Lower chest: No acute abnormality.  Bibasilar scarring is noted. Hepatobiliary: No focal liver abnormality is seen. Status post cholecystectomy. No biliary dilatation. Pancreas: Unremarkable. No pancreatic ductal dilatation or surrounding inflammatory changes. Spleen: Normal in size without focal abnormality. Adrenals/Urinary Tract: Adrenal glands are within normal limits. Bilateral hypodensities are noted within the kidneys consistent with cysts. No definitive renal calculi or obstructive changes are seen. Bladder is well distended. Stomach/Bowel: Diverticular change of the colon is noted without  definitive diverticulitis. The appendix is within normal limits. No obstructive or inflammatory changes are seen. Multiple ingested tablets are noted within the stomach. Vascular/Lymphatic: Aortic atherosclerosis. No enlarged abdominal or pelvic lymph nodes. Reproductive: Uterus and bilateral adnexa are unremarkable. Other: No abdominal wall hernia or abnormality. No abdominopelvic ascites. Musculoskeletal: Degenerative changes of the lumbar spine are noted. No acute bony abnormality is seen. IMPRESSION: Chronic changes without acute abnormality. Electronically Signed   By: Alcide Clever M.D.   On: 08/05/2018 21:45    Procedures Procedures (including critical care time)  Medications Ordered in ED Medications  sodium chloride 0.9 % bolus 1,000 mL (0  mLs Intravenous Stopped 08/05/18 2054)  ketorolac (TORADOL) 30 MG/ML injection 15 mg (15 mg Intravenous Given 08/05/18 1953)  fentaNYL (SUBLIMAZE) injection 25 mcg (25 mcg Intravenous Given 08/05/18 2117)     Initial Impression / Assessment and Plan / ED Course  I have reviewed the triage vital signs and the nursing notes.  Pertinent labs & imaging results that were available during my care of the patient were reviewed by me and considered in my medical decision making (see chart for details).     9:50 PM Patient CT scan generally unremarkable.  On given the positive hemoglobin value in the urine, there is suspicion for a recently passed kidney stone, particular given the patient's acute onset of pain. Patient has had some improvement here, has a non-peritoneal abdomen, is afebrile, no evidence for infection or other acute abdominal processes. Patient be started on analgesia, discharged with primary care follow-up.  Final Clinical Impressions(s) / ED Diagnoses  Abdominal pain   Gerhard MunchLockwood, Diya Gervasi, MD 08/05/18 2151

## 2018-08-05 NOTE — ED Notes (Signed)
Pt ambulatory to waiting room. Pt verbalized understanding of discharge instructions.   

## 2019-01-07 ENCOUNTER — Other Ambulatory Visit: Payer: Self-pay

## 2019-01-07 ENCOUNTER — Emergency Department (HOSPITAL_COMMUNITY): Payer: Self-pay

## 2019-01-07 ENCOUNTER — Encounter (HOSPITAL_COMMUNITY): Payer: Self-pay | Admitting: Emergency Medicine

## 2019-01-07 ENCOUNTER — Inpatient Hospital Stay (HOSPITAL_COMMUNITY)
Admission: EM | Admit: 2019-01-07 | Discharge: 2019-01-14 | DRG: 339 | Disposition: A | Discharge status: Home / Self Care | Payer: Self-pay | Attending: General Surgery | Admitting: General Surgery

## 2019-01-07 DIAGNOSIS — K3580 Unspecified acute appendicitis: Secondary | ICD-10-CM

## 2019-01-07 DIAGNOSIS — G8929 Other chronic pain: Secondary | ICD-10-CM | POA: Diagnosis present

## 2019-01-07 DIAGNOSIS — Z79899 Other long term (current) drug therapy: Secondary | ICD-10-CM

## 2019-01-07 DIAGNOSIS — K567 Ileus, unspecified: Secondary | ICD-10-CM | POA: Diagnosis not present

## 2019-01-07 DIAGNOSIS — Z888 Allergy status to other drugs, medicaments and biological substances status: Secondary | ICD-10-CM

## 2019-01-07 DIAGNOSIS — J9811 Atelectasis: Secondary | ICD-10-CM | POA: Diagnosis not present

## 2019-01-07 DIAGNOSIS — K30 Functional dyspepsia: Secondary | ICD-10-CM | POA: Diagnosis not present

## 2019-01-07 DIAGNOSIS — Z885 Allergy status to narcotic agent status: Secondary | ICD-10-CM

## 2019-01-07 DIAGNOSIS — K3533 Acute appendicitis with perforation and localized peritonitis, with abscess: Principal | ICD-10-CM | POA: Diagnosis present

## 2019-01-07 DIAGNOSIS — F17210 Nicotine dependence, cigarettes, uncomplicated: Secondary | ICD-10-CM | POA: Diagnosis present

## 2019-01-07 DIAGNOSIS — Z7951 Long term (current) use of inhaled steroids: Secondary | ICD-10-CM

## 2019-01-07 DIAGNOSIS — K589 Irritable bowel syndrome without diarrhea: Secondary | ICD-10-CM | POA: Diagnosis present

## 2019-01-07 DIAGNOSIS — Z881 Allergy status to other antibiotic agents status: Secondary | ICD-10-CM

## 2019-01-07 DIAGNOSIS — K3532 Acute appendicitis with perforation and localized peritonitis, without abscess: Secondary | ICD-10-CM | POA: Diagnosis present

## 2019-01-07 DIAGNOSIS — Z9049 Acquired absence of other specified parts of digestive tract: Secondary | ICD-10-CM

## 2019-01-07 DIAGNOSIS — K9189 Other postprocedural complications and disorders of digestive system: Secondary | ICD-10-CM | POA: Diagnosis not present

## 2019-01-07 HISTORY — DX: Other chronic pain: G89.29

## 2019-01-07 HISTORY — DX: Post-traumatic stress disorder, unspecified: F43.10

## 2019-01-07 HISTORY — DX: Major depressive disorder, single episode, unspecified: F32.9

## 2019-01-07 HISTORY — DX: Chronic obstructive pulmonary disease, unspecified: J44.9

## 2019-01-07 HISTORY — DX: Depression, unspecified: F32.A

## 2019-01-07 HISTORY — DX: Diverticulosis of intestine, part unspecified, without perforation or abscess without bleeding: K57.90

## 2019-01-07 HISTORY — DX: Panic disorder (episodic paroxysmal anxiety): F41.0

## 2019-01-07 LAB — CBC
HEMATOCRIT: 52.4 % — AB (ref 36.0–46.0)
Hemoglobin: 17 g/dL — ABNORMAL HIGH (ref 12.0–15.0)
MCH: 31 pg (ref 26.0–34.0)
MCHC: 32.4 g/dL (ref 30.0–36.0)
MCV: 95.6 fL (ref 80.0–100.0)
PLATELETS: 212 10*3/uL (ref 150–400)
RBC: 5.48 MIL/uL — ABNORMAL HIGH (ref 3.87–5.11)
RDW: 14 % (ref 11.5–15.5)
WBC: 18.5 10*3/uL — ABNORMAL HIGH (ref 4.0–10.5)
nRBC: 0 % (ref 0.0–0.2)

## 2019-01-07 LAB — COMPREHENSIVE METABOLIC PANEL
ALT: 19 U/L (ref 0–44)
AST: 22 U/L (ref 15–41)
Albumin: 3.8 g/dL (ref 3.5–5.0)
Alkaline Phosphatase: 66 U/L (ref 38–126)
Anion gap: 12 (ref 5–15)
BUN: 15 mg/dL (ref 8–23)
CHLORIDE: 101 mmol/L (ref 98–111)
CO2: 23 mmol/L (ref 22–32)
Calcium: 9.4 mg/dL (ref 8.9–10.3)
Creatinine, Ser: 0.82 mg/dL (ref 0.44–1.00)
GFR calc Af Amer: >60 mL/min (ref >60)
GFR calc non Af Amer: >60 mL/min (ref >60)
Glucose, Bld: 124 mg/dL — ABNORMAL HIGH (ref 70–99)
Potassium: 3.4 mmol/L — ABNORMAL LOW (ref 3.5–5.1)
SODIUM: 136 mmol/L (ref 135–145)
Total Bilirubin: 1.4 mg/dL — ABNORMAL HIGH (ref 0.3–1.2)
Total Protein: 7.6 g/dL (ref 6.5–8.1)

## 2019-01-07 LAB — LIPASE, BLOOD: Lipase: 27 U/L (ref 11–51)

## 2019-01-07 MED ORDER — SODIUM CHLORIDE 0.9% FLUSH: 3.0000 mL | Freq: Once | INTRAVENOUS | Status: DC

## 2019-01-07 MED ORDER — FENTANYL CITRATE (PF) 100 MCG/2ML IJ SOLN
50.0000 ug | Freq: Once | INTRAMUSCULAR | Status: AC
  Administered 2019-01-07: 50 ug via INTRAVENOUS
  Filled 2019-01-07: qty 2

## 2019-01-07 MED ORDER — SODIUM CHLORIDE 0.9 % IV BOLUS
1000.0000 mL | Freq: Once | INTRAVENOUS | Status: AC
  Administered 2019-01-07: 1000 mL via INTRAVENOUS

## 2019-01-07 NOTE — ED Provider Notes (Signed)
[facility] EMERGENCY DEPARTMENT Provider Note   CSN: [id number] Arrival date & time: 01/07/19  1715     History   Chief Complaint Chief Complaint  Patient presents with  . Abdominal Pain    HPI Breanna Lauberheresa D Bruce is a 63 y.o. female.  Patient presents with two days of intense generalized abdominal pain. She reports numerous episodes of watery diarrhea. Nausea, no active vomiting. History of diverticulosis and IBS.  The history is provided by the patient.  Abdominal Pain  Pain location:  Generalized Pain quality: cramping and tearing   Pain radiates to:  R flank Pain severity:  Severe Onset quality:  Gradual Duration:  2 days Timing:  Intermittent Progression:  Worsening Chronicity:  Recurrent Associated symptoms: chills, diarrhea and nausea     Past Medical History:  Diagnosis Date  . Diverticulosis   . IBS (irritable bowel syndrome)   . Temporomandibular joint disorder (TMJ)    age 63 fractured TMJ - repaired by oral surgeon    There are no active problems to display for this patient.   Past Surgical History:  Procedure Laterality Date  . CHOLECYSTECTOMY    . EYE SURGERY    . FRACTURE SURGERY       OB History    Gravida      Para      Term      Preterm      AB      Living  0     SAB      TAB      Ectopic      Multiple      Live Births               Home Medications    Prior to Admission medications   Medication Sig Start Date End Date Taking? Authorizing Provider  albuterol (PROVENTIL HFA;VENTOLIN HFA) 108 (90 Base) MCG/ACT inhaler Inhale 1-2 puffs into the lungs every 6 (six) hours as needed for wheezing or shortness of breath.    [provider]  baclofen (LIORESAL) 10 MG tablet Take 10 mg by mouth 3 (three) times daily.  07/08/18   [provider]  budesonide-formoterol (SYMBICORT) 160-4.5 MCG/ACT inhaler Inhale 2 puffs into the lungs 2 (two) times daily.    [provider]  clonazePAM (KLONOPIN) 1  MG tablet Take 1 mg by mouth every 6 (six) hours as needed for anxiety.    [provider]  fluticasone (FLONASE) 50 MCG/ACT nasal spray Place 2 sprays into both nostrils daily.    [provider]  gabapentin (NEURONTIN) 300 MG capsule Take 600 mg by mouth 3 (three) times daily.     [provider]  Gabapentin Enacarbil (HORIZANT) 600 MG TBCR Take 1 tablet by mouth daily.    [provider]  HYDROcodone-acetaminophen (NORCO/VICODIN) 5-325 MG tablet Take 1 tablet by mouth 3 (three) times daily as needed for severe pain. 08/05/18   Gerhard MunchLockwood, Robert, MD  omeprazole (PRILOSEC) 20 MG capsule Take 20 mg by mouth daily as needed (for acid reflux).     [provider]    Family History Family History  Problem Relation Age of Onset  . Breast cancer Neg Hx     Social History Social History   Tobacco Use  . Smoking status: Current Every Day Smoker    Packs/day: 0.50    Types: Cigarettes  . Smokeless tobacco: Never Used  Substance Use Topics  . Alcohol use: No  . Drug use: No  Allergies   Ceftin [cefuroxime axetil]; Tramadol; Benadryl [diphenhydramine hcl]; Compazine; and Epinephrine   Review of Systems Review of Systems  Constitutional: Positive for chills.  Gastrointestinal: Positive for abdominal pain, diarrhea and nausea.  All other systems reviewed and are negative.    Physical Exam Updated Vital Signs BP 97/63 (BP Location: Left Arm)   Pulse (!) 115   Temp 98 F (36.7 C) (Oral)   Resp 18   Ht 5\' 1"  (1.549 m)   Wt 46.3 kg   SpO2 100%   BMI 19.27 kg/m   Physical Exam Vitals signs and nursing note reviewed.  Constitutional:      General: She is in acute distress.     Appearance: She is well-developed.  Eyes:     Extraocular Movements: Extraocular movements intact.  Cardiovascular:     Rate and Rhythm: Tachycardia present.     Heart sounds: Normal heart sounds.  Pulmonary:     Effort: Pulmonary effort is normal.      Breath sounds: Normal breath sounds.  Abdominal:     General: Abdomen is flat.     Palpations: Abdomen is rigid.     Tenderness: There is generalized abdominal tenderness.  Skin:    General: Skin is warm and dry.  Neurological:     Mental Status: She is alert and oriented to person, place, and time.  Psychiatric:        Mood and Affect: Mood normal.      ED Treatments / Results  Labs (all labs ordered are listed, but only abnormal results are displayed) Labs Reviewed  COMPREHENSIVE METABOLIC PANEL - Abnormal; Notable for the following components:      Result Value   Potassium 3.4 (*)    Glucose, Bld 124 (*)    Total Bilirubin 1.4 (*)    All other components within normal limits  CBC - Abnormal; Notable for the following components:   WBC 18.5 (*)    RBC 5.48 (*)    Hemoglobin 17.0 (*)    HCT 52.4 (*)    All other components within normal limits  LIPASE, BLOOD  URINALYSIS, ROUTINE W REFLEX MICROSCOPIC    EKG None  Radiology Ct Abdomen Pelvis W Contrast  Result Date: 01/08/2019 CLINICAL DATA:  63 year old female with abdominal pain. EXAM: CT ABDOMEN AND PELVIS WITH CONTRAST TECHNIQUE: Multidetector CT imaging of the abdomen and pelvis was performed using the standard protocol following bolus administration of intravenous contrast. CONTRAST:  30mL ISOVUE-300 IOPAMIDOL (ISOVUE-300) INJECTION 61%, 100mL ISOVUE-300 IOPAMIDOL (ISOVUE-300) INJECTION 61% COMPARISON:  CT of the abdomen pelvis dated 08/05/2018 FINDINGS: Lower chest: The visualized lung bases are clear. No intra-abdominal free air.  No significant free fluid. Hepatobiliary: The liver is unremarkable. No intrahepatic biliary ductal dilatation. Cholecystectomy. Pancreas: Unremarkable. No pancreatic ductal dilatation or surrounding inflammatory changes. Spleen: Normal in size without focal abnormality. Adrenals/Urinary Tract: The adrenal glands are unremarkable. There are bilateral renal cysts measuring up to 3 cm. Smaller  hypodense lesions are too small to characterize. There is symmetric enhancement and excretion of contrast by both kidneys. There is no hydronephrosis on either side. The visualized ureters and urinary bladder appear unremarkable. Stomach/Bowel: There is sigmoid diverticulosis. There is mild diffuse thickening of the sigmoid colon as well as mild inflammatory changes of the distal and terminal ileum. The inflammatory changes of the colon and distal small bowel are likely reactive to inflammatory process of the pelvis which is centered in the right hemipelvis around the appendix and to a  lesser degree terminal ileum. The appendix is mildly dilated measuring 8 mm in diameter. There is a small stone in the midportion of the appendix. High attenuating content within the proximal lumen of the appendix may represent oral contrast or less likely small stones. Findings likely represent acute appendicitis with secondary and reactive inflammatory changes of the distal small bowel and colon. There is mild dilatation of multiple loops of small bowel measuring up to 2.8 cm, likely reactive ileus. No evidence of bowel obstruction. Vascular/Lymphatic: There is mild aortoiliac atherosclerotic disease. The IVC is unremarkable. No portal venous gas. There is no adenopathy. Reproductive: The uterus is grossly unremarkable. The left ovary is unremarkable as visualized. The right ovary is not well visualized. Other: None Musculoskeletal: No acute or significant osseous findings. IMPRESSION: Findings likely represent acute appendicitis with secondary inflammatory changes of the distal and terminal ileum as well as mild reactive inflammatory changes of the colon. Clinical correlation is recommended. No abscess. Electronically Signed   By: Elgie Collard M.D.   On: 01/08/2019 01:01    Procedures Procedures (including critical care time)  Medications Ordered in ED Medications  sodium chloride flush (NS) 0.9 % injection 3 mL (3 mLs  Intravenous Not Given 01/08/19 0004)  sodium chloride 0.9 % bolus 1,000 mL (0 mLs Intravenous Stopped 01/08/19 0004)  fentaNYL (SUBLIMAZE) injection 50 mcg (50 mcg Intravenous Given 01/07/19 2242)  HYDROmorphone (DILAUDID) injection 0.5 mg (0.5 mg Intravenous Given 01/08/19 0028)  iopamidol (ISOVUE-300) 61 % injection 30 mL (30 mLs Oral Contrast Given 01/08/19 0037)  iopamidol (ISOVUE-300) 61 % injection 100 mL (100 mLs Intravenous Contrast Given 01/08/19 0037)     Initial Impression / Assessment and Plan / ED Course  I have reviewed the triage vital signs and the nursing notes.  Pertinent labs & imaging results that were available during my care of the patient were reviewed by me and considered in my medical decision making (see chart for details).     Radiology findings concerning for appendicitis, with inflammatory changes of the sigmoid colon, distal and terminal ileum. Patient started on cipro and flagyl. Awaiting consult from surgery. Patient signed out to Dr. Blinda Leatherwood.  Final Clinical Impressions(s) / ED Diagnoses   Final diagnoses:  None    ED Discharge Orders    None       Felicie Morn, NP 01/08/19 1610    Loren Racer, MD 01/08/19 [phone]

## 2019-01-07 NOTE — ED Triage Notes (Signed)
Pt c/o abdominal pain that radiates to her back x 2 days. Endorses n/v/d.

## 2019-01-07 NOTE — ED Notes (Signed)
Pt called to go back to tx room- pt says she cannot walk or stand.  This nurse placed WC in front of pt and asked if she could get in wheelchair- pt was able to stand and turn around to sit in [facility].

## 2019-01-08 ENCOUNTER — Other Ambulatory Visit: Payer: Self-pay

## 2019-01-08 ENCOUNTER — Observation Stay (HOSPITAL_COMMUNITY): Payer: Self-pay | Admitting: Anesthesiology

## 2019-01-08 ENCOUNTER — Encounter (HOSPITAL_COMMUNITY): Payer: Self-pay | Admitting: Anesthesiology

## 2019-01-08 ENCOUNTER — Encounter (HOSPITAL_COMMUNITY)
Admission: EM | Disposition: A | Discharge status: Home / Self Care | Payer: Self-pay | Source: Home / Self Care | Attending: General Surgery

## 2019-01-08 DIAGNOSIS — F4310 Post-traumatic stress disorder, unspecified: Secondary | ICD-10-CM

## 2019-01-08 DIAGNOSIS — K3532 Acute appendicitis with perforation and localized peritonitis, without abscess: Secondary | ICD-10-CM

## 2019-01-08 DIAGNOSIS — K3580 Unspecified acute appendicitis: Secondary | ICD-10-CM | POA: Diagnosis present

## 2019-01-08 HISTORY — DX: Post-traumatic stress disorder, unspecified: F43.10

## 2019-01-08 HISTORY — PX: LAPAROSCOPIC APPENDECTOMY: SHX408

## 2019-01-08 SURGERY — APPENDECTOMY, LAPAROSCOPIC
Anesthesia: General | Site: Abdomen

## 2019-01-08 MED ORDER — BUPIVACAINE LIPOSOME 1.3 % IJ SUSP
INTRAMUSCULAR | Status: AC
  Filled 2019-01-08: qty 20

## 2019-01-08 MED ORDER — CIPROFLOXACIN IN D5W 400 MG/200ML IV SOLN
INTRAVENOUS | Status: DC | PRN
  Administered 2019-01-08: 400 mg via INTRAVENOUS

## 2019-01-08 MED ORDER — PHENYLEPHRINE 40 MCG/ML (10ML) SYRINGE FOR IV PUSH (FOR BLOOD PRESSURE SUPPORT)
PREFILLED_SYRINGE | INTRAVENOUS | Status: AC
  Filled 2019-01-08: qty 10

## 2019-01-08 MED ORDER — ENSURE ENLIVE PO LIQD
237.0000 mL | Freq: Three times a day (TID) | ORAL | Status: DC
  Administered 2019-01-08 – 2019-01-14 (×17): 237 mL via ORAL

## 2019-01-08 MED ORDER — DEXAMETHASONE SODIUM PHOSPHATE 10 MG/ML IJ SOLN
INTRAMUSCULAR | Status: DC | PRN
  Administered 2019-01-08: 4 mg via INTRAVENOUS

## 2019-01-08 MED ORDER — SUCCINYLCHOLINE CHLORIDE 200 MG/10ML IV SOSY
PREFILLED_SYRINGE | INTRAVENOUS | Status: DC | PRN
  Administered 2019-01-08: 100 mg via INTRAVENOUS

## 2019-01-08 MED ORDER — OXYCODONE HCL 5 MG PO TABS
5.0000 mg | ORAL_TABLET | ORAL | Status: DC | PRN
  Administered 2019-01-08 – 2019-01-13 (×19): 10 mg via ORAL
  Administered 2019-01-13: 5 mg via ORAL
  Administered 2019-01-13 – 2019-01-14 (×4): 10 mg via ORAL
  Filled 2019-01-08 (×5): qty 2
  Filled 2019-01-08: qty 1
  Filled 2019-01-08 (×11): qty 2
  Filled 2019-01-08: qty 1
  Filled 2019-01-08 (×8): qty 2

## 2019-01-08 MED ORDER — MORPHINE SULFATE (PF) 2 MG/ML IV SOLN
INTRAVENOUS | Status: AC
  Administered 2019-01-09: 2 mg via INTRAVENOUS
  Filled 2019-01-08: qty 1

## 2019-01-08 MED ORDER — HYDROCODONE-ACETAMINOPHEN 7.5-325 MG PO TABS: 1.0000 | ORAL_TABLET | Freq: Once | ORAL | Status: DC | PRN

## 2019-01-08 MED ORDER — HYDROMORPHONE HCL 1 MG/ML IJ SOLN
0.5000 mg | Freq: Once | INTRAMUSCULAR | Status: AC
  Administered 2019-01-08: 0.5 mg via INTRAVENOUS
  Filled 2019-01-08: qty 1

## 2019-01-08 MED ORDER — 0.9 % SODIUM CHLORIDE (POUR BTL) OPTIME
TOPICAL | Status: DC | PRN
  Administered 2019-01-08: 1000 mL

## 2019-01-08 MED ORDER — SUCCINYLCHOLINE CHLORIDE 200 MG/10ML IV SOSY
PREFILLED_SYRINGE | INTRAVENOUS | Status: AC
  Filled 2019-01-08: qty 10

## 2019-01-08 MED ORDER — PHENYLEPHRINE HCL 10 MG/ML IJ SOLN
INTRAMUSCULAR | Status: DC | PRN
  Administered 2019-01-08 (×2): 100 ug via INTRAVENOUS

## 2019-01-08 MED ORDER — SUGAMMADEX SODIUM 200 MG/2ML IV SOLN
INTRAVENOUS | Status: DC | PRN
  Administered 2019-01-08: 200 mg via INTRAVENOUS

## 2019-01-08 MED ORDER — SODIUM CHLORIDE 0.9 % IR SOLN
Status: DC | PRN
  Administered 2019-01-08: 3000 mL

## 2019-01-08 MED ORDER — MIDAZOLAM HCL 2 MG/2ML IJ SOLN
INTRAMUSCULAR | Status: AC
  Filled 2019-01-08: qty 2

## 2019-01-08 MED ORDER — SODIUM CHLORIDE 0.9% FLUSH
INTRAVENOUS | Status: AC
  Filled 2019-01-08: qty 20

## 2019-01-08 MED ORDER — MORPHINE SULFATE (PF) 2 MG/ML IV SOLN
2.0000 mg | INTRAVENOUS | Status: DC | PRN
  Administered 2019-01-08 – 2019-01-11 (×10): 2 mg via INTRAVENOUS
  Filled 2019-01-08 (×9): qty 1

## 2019-01-08 MED ORDER — ACETAMINOPHEN 325 MG PO TABS
650.0000 mg | ORAL_TABLET | Freq: Four times a day (QID) | ORAL | Status: DC | PRN
  Administered 2019-01-09: 650 mg via ORAL
  Filled 2019-01-08: qty 2

## 2019-01-08 MED ORDER — CIPROFLOXACIN IN D5W 400 MG/200ML IV SOLN
INTRAVENOUS | Status: AC
  Filled 2019-01-08: qty 200

## 2019-01-08 MED ORDER — ONDANSETRON HCL 4 MG/2ML IJ SOLN
INTRAMUSCULAR | Status: DC | PRN
  Administered 2019-01-08: 4 mg via INTRAVENOUS

## 2019-01-08 MED ORDER — PROPOFOL 10 MG/ML IV BOLUS
INTRAVENOUS | Status: DC | PRN
  Administered 2019-01-08: 120 mg via INTRAVENOUS

## 2019-01-08 MED ORDER — DEXAMETHASONE SODIUM PHOSPHATE 10 MG/ML IJ SOLN
INTRAMUSCULAR | Status: AC
  Filled 2019-01-08: qty 1

## 2019-01-08 MED ORDER — LACTATED RINGERS IV SOLN
INTRAVENOUS | Status: DC
  Administered 2019-01-08: 08:00:00 via INTRAVENOUS

## 2019-01-08 MED ORDER — ONDANSETRON HCL 4 MG/2ML IJ SOLN
4.0000 mg | Freq: Four times a day (QID) | INTRAMUSCULAR | Status: DC | PRN
  Administered 2019-01-12 – 2019-01-13 (×2): 4 mg via INTRAVENOUS
  Filled 2019-01-08 (×2): qty 2

## 2019-01-08 MED ORDER — HYDROMORPHONE HCL 1 MG/ML IJ SOLN
INTRAMUSCULAR | Status: AC
  Filled 2019-01-08: qty 0.5

## 2019-01-08 MED ORDER — IOPAMIDOL (ISOVUE-300) INJECTION 61%
100.0000 mL | Freq: Once | INTRAVENOUS | Status: AC | PRN
  Administered 2019-01-08: 100 mL via INTRAVENOUS

## 2019-01-08 MED ORDER — IOPAMIDOL (ISOVUE-300) INJECTION 61%: 30.0000 mL | Freq: Once | INTRAVENOUS | Status: DC | PRN

## 2019-01-08 MED ORDER — NICOTINE 14 MG/24HR TD PT24
14.0000 mg | MEDICATED_PATCH | Freq: Every day | TRANSDERMAL | Status: DC
  Filled 2019-01-08 (×7): qty 1

## 2019-01-08 MED ORDER — MIDAZOLAM HCL 5 MG/5ML IJ SOLN
INTRAMUSCULAR | Status: DC | PRN
  Administered 2019-01-08: 2 mg via INTRAVENOUS

## 2019-01-08 MED ORDER — IOPAMIDOL (ISOVUE-300) INJECTION 61%
30.0000 mL | Freq: Once | INTRAVENOUS | Status: AC | PRN
  Administered 2019-01-08: 30 mL via ORAL

## 2019-01-08 MED ORDER — KETOROLAC TROMETHAMINE 30 MG/ML IJ SOLN
INTRAMUSCULAR | Status: AC
  Filled 2019-01-08: qty 1

## 2019-01-08 MED ORDER — ONDANSETRON HCL 4 MG/2ML IJ SOLN
INTRAMUSCULAR | Status: AC
  Filled 2019-01-08: qty 2

## 2019-01-08 MED ORDER — ALBUTEROL SULFATE HFA 108 (90 BASE) MCG/ACT IN AERS: 1.0000 | INHALATION_SPRAY | Freq: Four times a day (QID) | RESPIRATORY_TRACT | Status: DC | PRN

## 2019-01-08 MED ORDER — ROCURONIUM BROMIDE 100 MG/10ML IV SOLN
INTRAVENOUS | Status: DC | PRN
  Administered 2019-01-08: 40 mg via INTRAVENOUS

## 2019-01-08 MED ORDER — CLONAZEPAM 0.5 MG PO TABS
1.0000 mg | ORAL_TABLET | Freq: Four times a day (QID) | ORAL | Status: DC | PRN
  Administered 2019-01-08 – 2019-01-09 (×3): 1 mg via ORAL
  Filled 2019-01-08 (×5): qty 2

## 2019-01-08 MED ORDER — ONDANSETRON HCL 4 MG/2ML IJ SOLN: 4.0000 mg | Freq: Once | INTRAMUSCULAR | Status: DC | PRN

## 2019-01-08 MED ORDER — ALBUTEROL SULFATE (2.5 MG/3ML) 0.083% IN NEBU: 2.5000 mg | INHALATION_SOLUTION | Freq: Four times a day (QID) | RESPIRATORY_TRACT | Status: DC | PRN

## 2019-01-08 MED ORDER — MEPERIDINE HCL 50 MG/ML IJ SOLN: 6.2500 mg | INTRAMUSCULAR | Status: DC | PRN

## 2019-01-08 MED ORDER — PROPOFOL 10 MG/ML IV BOLUS
INTRAVENOUS | Status: AC
  Filled 2019-01-08: qty 20

## 2019-01-08 MED ORDER — BUPIVACAINE HCL (PF) 0.5 % IJ SOLN
INTRAMUSCULAR | Status: DC | PRN
  Administered 2019-01-08: 10 mL

## 2019-01-08 MED ORDER — LIDOCAINE 2% (20 MG/ML) 5 ML SYRINGE
INTRAMUSCULAR | Status: AC
  Filled 2019-01-08: qty 5

## 2019-01-08 MED ORDER — HYDROMORPHONE HCL 1 MG/ML IJ SOLN
0.2500 mg | INTRAMUSCULAR | Status: DC | PRN
  Administered 2019-01-08 (×2): 0.5 mg via INTRAVENOUS

## 2019-01-08 MED ORDER — BUPIVACAINE HCL (PF) 0.25 % IJ SOLN
INTRAMUSCULAR | Status: AC
  Filled 2019-01-08: qty 30

## 2019-01-08 MED ORDER — GABAPENTIN ENACARBIL ER 600 MG PO TBCR: 1.0000 | EXTENDED_RELEASE_TABLET | Freq: Every day | ORAL | Status: DC

## 2019-01-08 MED ORDER — CIPROFLOXACIN IN D5W 400 MG/200ML IV SOLN
400.0000 mg | Freq: Once | INTRAVENOUS | Status: AC
  Administered 2019-01-08: 400 mg via INTRAVENOUS
  Filled 2019-01-08: qty 200

## 2019-01-08 MED ORDER — ROCURONIUM BROMIDE 10 MG/ML (PF) SYRINGE
PREFILLED_SYRINGE | INTRAVENOUS | Status: AC
  Filled 2019-01-08: qty 10

## 2019-01-08 MED ORDER — KETOROLAC TROMETHAMINE 30 MG/ML IJ SOLN
30.0000 mg | Freq: Once | INTRAMUSCULAR | Status: AC | PRN
  Administered 2019-01-08: 30 mg via INTRAVENOUS

## 2019-01-08 MED ORDER — SUGAMMADEX SODIUM 200 MG/2ML IV SOLN
INTRAVENOUS | Status: AC
  Filled 2019-01-08: qty 2

## 2019-01-08 MED ORDER — SIMETHICONE 80 MG PO CHEW
40.0000 mg | CHEWABLE_TABLET | Freq: Four times a day (QID) | ORAL | Status: DC | PRN
  Administered 2019-01-12: 40 mg via ORAL
  Filled 2019-01-08 (×2): qty 1

## 2019-01-08 MED ORDER — GABAPENTIN 300 MG PO CAPS
600.0000 mg | ORAL_CAPSULE | Freq: Three times a day (TID) | ORAL | Status: DC
  Administered 2019-01-08: 300 mg via ORAL
  Filled 2019-01-08: qty 2

## 2019-01-08 MED ORDER — ONDANSETRON 4 MG PO TBDP: 4.0000 mg | ORAL_TABLET | Freq: Four times a day (QID) | ORAL | Status: DC | PRN

## 2019-01-08 MED ORDER — ACETAMINOPHEN 650 MG RE SUPP
650.0000 mg | Freq: Four times a day (QID) | RECTAL | Status: DC | PRN
  Filled 2019-01-08: qty 1

## 2019-01-08 MED ORDER — LIDOCAINE HCL (CARDIAC) PF 100 MG/5ML IV SOSY
PREFILLED_SYRINGE | INTRAVENOUS | Status: DC | PRN
  Administered 2019-01-08: 60 mg via INTRAVENOUS

## 2019-01-08 MED ORDER — FENTANYL CITRATE (PF) 100 MCG/2ML IJ SOLN
INTRAMUSCULAR | Status: DC | PRN
  Administered 2019-01-08: 100 ug via INTRAVENOUS
  Administered 2019-01-08 (×3): 50 ug via INTRAVENOUS

## 2019-01-08 MED ORDER — METRONIDAZOLE IN NACL 5-0.79 MG/ML-% IV SOLN
500.0000 mg | Freq: Once | INTRAVENOUS | Status: AC
  Administered 2019-01-08: 500 mg via INTRAVENOUS
  Filled 2019-01-08: qty 100

## 2019-01-08 MED ORDER — PANTOPRAZOLE SODIUM 40 MG IV SOLR: 40.0000 mg | Freq: Every day | INTRAVENOUS | Status: DC

## 2019-01-08 MED ORDER — BACLOFEN 10 MG PO TABS
10.0000 mg | ORAL_TABLET | Freq: Three times a day (TID) | ORAL | Status: DC
  Administered 2019-01-08 – 2019-01-14 (×20): 10 mg via ORAL
  Filled 2019-01-08 (×22): qty 1

## 2019-01-08 MED ORDER — FLUTICASONE PROPIONATE 50 MCG/ACT NA SUSP
2.0000 | Freq: Every day | NASAL | Status: DC
  Administered 2019-01-08 – 2019-01-14 (×7): 2 via NASAL
  Filled 2019-01-08 (×3): qty 16

## 2019-01-08 MED ORDER — GABAPENTIN 300 MG PO CAPS
300.0000 mg | ORAL_CAPSULE | Freq: Two times a day (BID) | ORAL | Status: DC
  Administered 2019-01-08 – 2019-01-14 (×12): 300 mg via ORAL
  Filled 2019-01-08 (×12): qty 1

## 2019-01-08 MED ORDER — METOPROLOL TARTRATE 5 MG/5ML IV SOLN: 5.0000 mg | Freq: Four times a day (QID) | INTRAVENOUS | Status: DC | PRN

## 2019-01-08 MED ORDER — FENTANYL CITRATE (PF) 250 MCG/5ML IJ SOLN
INTRAMUSCULAR | Status: AC
  Filled 2019-01-08: qty 5

## 2019-01-08 MED ORDER — MOMETASONE FURO-FORMOTEROL FUM 200-5 MCG/ACT IN AERO
2.0000 | INHALATION_SPRAY | Freq: Two times a day (BID) | RESPIRATORY_TRACT | Status: DC
  Administered 2019-01-08 – 2019-01-14 (×12): 2 via RESPIRATORY_TRACT
  Filled 2019-01-08: qty 8.8

## 2019-01-08 MED ORDER — PANTOPRAZOLE SODIUM 40 MG PO TBEC
40.0000 mg | DELAYED_RELEASE_TABLET | Freq: Every day | ORAL | Status: DC
  Administered 2019-01-09: 40 mg via ORAL
  Filled 2019-01-08 (×4): qty 1

## 2019-01-08 SURGICAL SUPPLY — 44 items
BAG RETRIEVAL 10 (BASKET) ×1
BAG RETRIEVAL 10MM (BASKET) ×1
BLADE SURG 15 STRL LF DISP TIS (BLADE) ×1 IMPLANT
BLADE SURG 15 STRL SS (BLADE) ×2
CHLORAPREP W/TINT 26ML (MISCELLANEOUS) ×3 IMPLANT
CLOTH BEACON ORANGE TIMEOUT ST (SAFETY) ×3 IMPLANT
COVER LIGHT HANDLE STERIS (MISCELLANEOUS) ×6 IMPLANT
COVER WAND RF STERILE (DRAPES) ×3 IMPLANT
CUTTER FLEX LINEAR 45M (STAPLE) ×3 IMPLANT
DECANTER SPIKE VIAL GLASS SM (MISCELLANEOUS) ×3 IMPLANT
DERMABOND ADVANCED (GAUZE/BANDAGES/DRESSINGS) ×2
DERMABOND ADVANCED .7 DNX12 (GAUZE/BANDAGES/DRESSINGS) ×1 IMPLANT
ELECT REM PT RETURN 9FT ADLT (ELECTROSURGICAL) ×3
ELECTRODE REM PT RTRN 9FT ADLT (ELECTROSURGICAL) ×1 IMPLANT
GLOVE BIO SURGEON STRL SZ 6.5 (GLOVE) ×2 IMPLANT
GLOVE BIO SURGEONS STRL SZ 6.5 (GLOVE) ×1
GLOVE BIOGEL PI IND STRL 6.5 (GLOVE) ×1 IMPLANT
GLOVE BIOGEL PI IND STRL 7.0 (GLOVE) ×3 IMPLANT
GLOVE BIOGEL PI INDICATOR 6.5 (GLOVE) ×2
GLOVE BIOGEL PI INDICATOR 7.0 (GLOVE) ×6
GOWN STRL REUS W/TWL LRG LVL3 (GOWN DISPOSABLE) ×6 IMPLANT
INST SET LAPROSCOPIC AP (KITS) ×3 IMPLANT
IV NS IRRIG 3000ML ARTHROMATIC (IV SOLUTION) ×3 IMPLANT
KIT TURNOVER KIT A (KITS) ×3 IMPLANT
MANIFOLD NEPTUNE II (INSTRUMENTS) ×3 IMPLANT
NEEDLE INSUFFLATION 14GA 120MM (NEEDLE) ×3 IMPLANT
NS IRRIG 1000ML POUR BTL (IV SOLUTION) ×3 IMPLANT
PACK LAP CHOLE LZT030E (CUSTOM PROCEDURE TRAY) ×3 IMPLANT
PAD ARMBOARD 7.5X6 YLW CONV (MISCELLANEOUS) ×3 IMPLANT
RELOAD STAPLE TA45 3.5 REG BLU (ENDOMECHANICALS) ×3 IMPLANT
SET BASIN LINEN APH (SET/KITS/TRAYS/PACK) ×3 IMPLANT
SET TUBE IRRIG SUCTION NO TIP (IRRIGATION / IRRIGATOR) ×3 IMPLANT
SHEARS HARMONIC ACE PLUS 36CM (ENDOMECHANICALS) ×3 IMPLANT
SUT MNCRL AB 4-0 PS2 18 (SUTURE) ×3 IMPLANT
SUT VICRYL 0 UR6 27IN ABS (SUTURE) ×3 IMPLANT
SYS BAG RETRIEVAL 10MM (BASKET) ×1
SYSTEM BAG RETRIEVAL 10MM (BASKET) ×1 IMPLANT
TRAY FOLEY CATH 16FR SILVER (SET/KITS/TRAYS/PACK) ×3 IMPLANT
TROCAR ENDO BLADELESS 11MM (ENDOMECHANICALS) ×3 IMPLANT
TROCAR ENDO BLADELESS 12MM (ENDOMECHANICALS) ×3 IMPLANT
TROCAR XCEL NON-BLD 5MMX100MML (ENDOMECHANICALS) ×3 IMPLANT
TUBING INSUFFLATION (TUBING) ×3 IMPLANT
WARMER LAPAROSCOPE (MISCELLANEOUS) ×3 IMPLANT
YANKAUER SUCT 12FT TUBE ARGYLE (SUCTIONS) ×3 IMPLANT

## 2019-01-08 NOTE — Anesthesia Procedure Notes (Signed)
Procedure Name: Intubation Date/Time: 01/08/2019 8:00 AM Performed by: Jonna Munro, CRNA Pre-anesthesia Checklist: Patient identified, Emergency Drugs available, Suction available, Patient being monitored and Timeout performed Patient Re-evaluated:Patient Re-evaluated prior to induction Oxygen Delivery Method: Circle system utilized Preoxygenation: Pre-oxygenation with 100% oxygen Induction Type: IV induction and Rapid sequence Laryngoscope Size: Mac and 3 Grade View: Grade II Tube type: Oral Tube size: 7.0 mm Number of attempts: 1 Airway Equipment and Method: Stylet Placement Confirmation: ETT inserted through vocal cords under direct vision,  positive ETCO2 and breath sounds checked- equal and bilateral Secured at: 21 cm Tube secured with: Tape Dental Injury: Teeth and Oropharynx as per pre-operative assessment

## 2019-01-08 NOTE — Progress Notes (Signed)
[facility]  Checked on patient this afternoon. Doing well. Wanting chocolate ensure. No nausea. Gave soft diet. Will go home tomorrow.  Algis Greenhouse, MD [facility] [address] [place], [place] [address] [phone] (office)

## 2019-01-08 NOTE — Transfer of Care (Signed)
Immediate Anesthesia Transfer of Care Note  Patient: [name]  Procedure(s) Performed: APPENDECTOMY LAPAROSCOPIC (N/A Abdomen)  Patient Location: PACU  Anesthesia Type:General  Level of Consciousness: awake, alert  and oriented  Airway & Oxygen Therapy: Patient Spontanous Breathing and Patient connected to face mask oxygen  Post-op Assessment: Report given to RN and Post -op Vital signs reviewed and stable  Post vital signs: Reviewed and stable  Last Vitals:  Vitals Value Taken Time  BP    Temp    Pulse 44 01/08/2019  9:19 AM  Resp    SpO2 79 % 01/08/2019  9:19 AM  Vitals shown include unvalidated device data.  Last Pain:  Vitals:   01/08/19 0728  TempSrc: Oral  PainSc: 8          Complications: No apparent anesthesia complications

## 2019-01-08 NOTE — ED Notes (Addendum)
Bedside toilet brought to room per pt request. Small amount of brownish liquid feces noted to brief. New brief and wash cloth provided. Pt has call bell within reach.

## 2019-01-08 NOTE — Progress Notes (Signed)
[facility]  Husband not at hospital. Called his phone to notify him of surgery. Plans for keeping her overnight due to concerns for ileus.   Algis Greenhouse, MD [facility] [address] [place], [place] [address] [phone] (office)

## 2019-01-08 NOTE — Progress Notes (Signed)
[facility]  Reviewed imaging. Acute appendicitis. Plan for appendectomy in am. Will discuss with patient at that time. Observation for now.   PRN for pain NPO with meds Received Cipro/ Flagyl  Will consent in the AM   Algis GreenhouseLindsay Humbert Morozov, MD [facility] [address] [place], [place] [phone] (office)

## 2019-01-08 NOTE — Addendum Note (Signed)
Addendum  created 01/08/19 1209 by Shanon Payor, CRNA   Intraprocedure Flowsheets edited

## 2019-01-08 NOTE — Op Note (Addendum)
[facility]  Date of Surgery: 01/08/2019  Admit Date: 01/07/2019   Performing Service: General  Surgeon(s) and Role:    * [name], MD - Primary   Pre-operative Diagnosis: Acute Appendicitis  Post-operative Diagnosis: Acute Appendicitis  Procedure Performed: Laparoscopic Appendectomy   Surgeon: Leatrice Jewels. Henreitta Leber, MD   Assistant: No qualified resident was available.   Anesthesia: General   Findings:  The appendix was found to be inflamed. There were signs of necrosis. There was perforation. There was not abscess formation but there was purulent drainage.  Estimated Blood Loss: Minimal   Specimens:  ID Type Source Tests Collected by Time Destination  1 : appendix GI Appendix SURGICAL PATHOLOGY [name], MD 01/08/2019 [phone]      Complications: None; patient tolerated the procedure well.   Disposition: Floor bed due to dilated bowel and concern for ileus post operatively   Condition: stable   Indications: The patient presented with a 2 day history of right-sided abdominal pain. A CT revealed findings consistent with acute appendicitis.   Procedure Details  Prior to the procedure, the risks, benefits, complications, treatment options, and expected outcomes were discussed with the patient and/or family, including but not limited to the risk of bleeding, infection, finding of a normal appendix, and the need for conversion to an open procedure. There was concurrence with the proposed plan and informed consent was obtained. The patient was taken to the operating room, identified as [name] and the procedure verified as Laproscopic Appendectomy.    The patient was placed in the supine position and general anesthesia was induced, along with placement of orogastric tube, SCD's, and a Foley catheter. The abdomen was prepped and draped in a sterile fashion. An incision was made supra umbilical, and the abdomen was entered with Veress technique  with a towel clip elevating the abdominal wall. Intraperitoneal placement was confirmed with saline drop, low entry pressures, and easy insufflation.  A 10 mm optiview trocar was placed under direct visualization with a 0 degree scope. The 10 mm 0 degree scope was placed in the abdomen and no evidence of injury was identified. A 12 mm port was placed in the left lower quadrant of the abdomen after skin incision with trocar placement under direct vision. A careful evaluation of the entire abdomen was carried out. An additional 5 mm port was placed in the suprapubic area under direct vision.  The patient was placed in Trendelenburg and left lateral decubitus position. The small intestines were retracted in the cephalad and left lateral direction away from the pelvis and right lower quadrant. The patient was found to have an inflamed, necrotic appendix with perforation. There was purulent drainage and signs of purulent peritonitis. appendix.   The appendix was carefully dissected. A window was made in the mesoappendix at the base of the appendix. The appendix was divided at its base using another tan endo-GIA stapler. Minimal appendiceal stump was left in place. The mesoappendix was taken with the harmonic energy device. The appendix was placed within an Endocatch specimen bag. There was no evidence of bleeding, leakage, or complication after division of the appendix.  Any remaining blood or pus was suctioned out from the abdomen, hemostasis was confirmed. The endocatch bag was removed via the 12 mm port, then the abdomen desufflated. The appendix was passed off the field as a specimen.   The the 12 mm and 10 mm port sites were closed with a 0 Vicryl suture. The trocar site  skin wounds were closed using subcuticular 4-0 Monocryl suture and dermabond. The patient was then awakened from general anesthesia, extubated, and taken to PACU for recovery.   Instrument, sponge, and needle counts were correct at the  conclusion of the case.   Algis Greenhouse, MD [facility] [address] [place], [place] [address] [phone] (office)

## 2019-01-08 NOTE — H&P (Signed)
[facility] History and Physical  Reason for Referral: Acute Appendicitis on CT  Referring Physician: Dr. Katrinka Blazing   Chief Complaint    Abdominal Pain      [name] is a 63 y.o. female.  HPI: [name] is a 63 yo with chronic pain, IBS, but no reported IBD/ Crohn's or UC who comes in with a 2 day history of abdominal pain, nausea, and diarrhea. She says she vomited at least once. She says the pain started throughout her abdomen and now is localized in the right lower side. She is unsure of any fevers but has had some chills. She says that she has continued to have diarrhea.  She reports chronic pain all over her body but denies fibromyalgia. 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   Past Medical History:  Diagnosis Date  . Chronic pain    gneralized denies fibromyalgia  . Diverticulosis   . IBS (irritable bowel syndrome)   . 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 CHOLECYSTECTOMY    . EYE SURGERY    . FRACTURE SURGERY      Family History  Problem Relation Age of Onset  . Breast cancer Neg Hx     Social History   Tobacco Use  . Smoking status: Current Every Day Smoker    Packs/day: 0.50    Types: Cigarettes  . Smokeless tobacco: Never Used  Substance Use Topics  . Alcohol use: No  . Drug use: No    Medications:  I have reviewed the patient's current medications. Prior to Admission:  Medications Prior to Admission  Medication Sig Dispense Refill Last Dose  . albuterol (PROVENTIL HFA;VENTOLIN HFA) 108 (90 Base) MCG/ACT inhaler Inhale 1-2 puffs into the lungs every 6 (six) hours as needed for wheezing or shortness of breath.   Unknown at Unknown time  . baclofen (LIORESAL) 10 MG tablet Take 10 mg by mouth 3 (three) times daily.   1 Unknown at Unknown time  . budesonide-formoterol (SYMBICORT) 160-4.5 MCG/ACT inhaler Inhale 2  puffs into the lungs 2 (two) times daily.   Unknown at Unknown time  . clonazePAM (KLONOPIN) 1 MG tablet Take 1 mg by mouth every 6 (six) hours as needed for anxiety.   Unknown at Unknown time  . fluticasone (FLONASE) 50 MCG/ACT nasal spray Place 2 sprays into both nostrils daily.   Unknown at Unknown time  . gabapentin (NEURONTIN) 300 MG capsule Take 600 mg by mouth 3 (three) times daily.    Unknown at Unknown time  . Gabapentin Enacarbil (HORIZANT) 600 MG TBCR Take 1 tablet by mouth daily.   Unknown at Unknown time  . HYDROcodone-acetaminophen (NORCO/VICODIN) 5-325 MG tablet Take 1 tablet by mouth 3 (three) times daily as needed for severe pain. 10 tablet 0 Unknown at Unknown time  . omeprazole (PRILOSEC) 20 MG capsule Take 20 mg by mouth daily as needed (for acid reflux).    Unknown at Unknown time   Scheduled: . baclofen  10 mg Oral TID  . fluticasone  2 spray Each Nare Daily  . gabapentin  600 mg Oral TID  . Gabapentin Enacarbil  1 tablet Oral Daily  . mometasone-formoterol  2 puff Inhalation BID  . nicotine  14 mg Transdermal Daily  . pantoprazole  40 mg Oral Daily  . pantoprazole (PROTONIX) IV  40 mg  Intravenous QHS  . [MAR Hold] sodium chloride flush  3 mL Intravenous Once   Continuous:  ZOX:[id number]:acetaminophen **OR** acetaminophen, albuterol, clonazePAM, metoprolol tartrate, morphine injection, ondansetron **OR** ondansetron (ZOFRAN) IV, simethicone Allergies  Allergen Reactions  . Ceftin [Cefuroxime Axetil]   . Tramadol   . Benadryl [Diphenhydramine Hcl] Palpitations    Insomnia  . Compazine Palpitations    Insomnia  . Epinephrine Palpitations    Insomina      ROS:  A comprehensive review of systems was negative except for: Constitutional: positive for chills Gastrointestinal: positive for abdominal pain, diarrhea and nausea  Blood pressure 118/71, pulse 86, temperature 98.4 F (36.9 C), temperature source Oral, resp. rate 18, height 5\' 1"  (1.549 m), weight 46.3 kg, SpO2  94 %. Physical Exam Vitals signs reviewed.  Constitutional:      Appearance: She is well-developed.  HENT:     Head: Normocephalic and atraumatic.  Eyes:     Extraocular Movements: Extraocular movements intact.  Cardiovascular:     Rate and Rhythm: Normal rate and regular rhythm.  Pulmonary:     Effort: Pulmonary effort is normal.     Breath sounds: Normal breath sounds.  Abdominal:     General: Abdomen is flat.     Palpations: Abdomen is soft.     Tenderness: There is abdominal tenderness in the right lower quadrant and suprapubic area.     Hernia: There is no hernia in the umbilical area.  Musculoskeletal: Normal range of motion.     Right lower leg: No edema.     Left lower leg: No edema.  Skin:    General: Skin is warm and dry.  Neurological:     General: No focal deficit present.     Mental Status: She is alert and oriented to person, place, and time.  Psychiatric:        Mood and Affect: Mood normal.        Behavior: Behavior normal.     Results: Results for orders placed or performed during the hospital encounter of 01/07/19 (from the past 48 hour(s))  Lipase, blood     Status: None   Collection Time: 01/07/19  6:12 PM  Result Value Ref Range   Lipase 27 11 - 51 U/L    Comment: Performed at [facility], [address] [place], [place] [address]  Comprehensive metabolic panel     Status: Abnormal   Collection Time: 01/07/19  6:12 PM  Result Value Ref Range   Sodium 136 135 - 145 mmol/L   Potassium 3.4 (L) 3.5 - 5.1 mmol/L   Chloride 101 98 - 111 mmol/L   CO2 23 22 - 32 mmol/L   Glucose, Bld 124 (H) 70 - 99 mg/dL   BUN 15 8 - 23 mg/dL   Creatinine, Ser 9.560.82 0.44 - 1.00 mg/dL   Calcium 9.4 8.9 - 21.310.3 mg/dL   Total Protein 7.6 6.5 - 8.1 g/dL   Albumin 3.8 3.5 - 5.0 g/dL   AST 22 15 - 41 U/L   ALT 19 0 - 44 U/L   Alkaline Phosphatase 66 38 - 126 U/L   Total Bilirubin 1.4 (H) 0.3 - 1.2 mg/dL   GFR calc non Af Amer >60 >60 mL/min   GFR calc Af Amer >60 >60  mL/min   Anion gap 12 5 - 15    Comment: Performed at [facility], [address] [place], [place] [address]  CBC     Status: Abnormal   Collection Time:  01/07/19  6:12 PM  Result Value Ref Range   WBC 18.5 (H) 4.0 - 10.5 K/uL   RBC 5.48 (H) 3.87 - 5.11 MIL/uL   Hemoglobin 17.0 (H) 12.0 - 15.0 g/dL   HCT 16.152.4 (H) 09.636.0 - 04.546.0 %   MCV 95.6 80.0 - 100.0 fL   MCH 31.0 26.0 - 34.0 pg   MCHC 32.4 30.0 - 36.0 g/dL   RDW 40.914.0 81.111.5 - 91.415.5 %   Platelets 212 150 - 400 K/uL   nRBC 0.0 0.0 - 0.2 %    Comment: Performed at [facility], [address] [place], [place] [address]   Personally reviewed- appendix dilated with appendicolith, ileum and colon a little inflamed around it   Ct Abdomen Pelvis W Contrast  Result Date: 01/08/2019 CLINICAL DATA:  63 year old female with abdominal pain. EXAM: CT ABDOMEN AND PELVIS WITH CONTRAST TECHNIQUE: Multidetector CT imaging of the abdomen and pelvis was performed using the standard protocol following bolus administration of intravenous contrast. CONTRAST:  30mL ISOVUE-300 IOPAMIDOL (ISOVUE-300) INJECTION 61%, 100mL ISOVUE-300 IOPAMIDOL (ISOVUE-300) INJECTION 61% COMPARISON:  CT of the abdomen pelvis dated 08/05/2018 FINDINGS: Lower chest: The visualized lung bases are clear. No intra-abdominal free air.  No significant free fluid. Hepatobiliary: The liver is unremarkable. No intrahepatic biliary ductal dilatation. Cholecystectomy. Pancreas: Unremarkable. No pancreatic ductal dilatation or surrounding inflammatory changes. Spleen: Normal in size without focal abnormality. Adrenals/Urinary Tract: The adrenal glands are unremarkable. There are bilateral renal cysts measuring up to 3 cm. Smaller hypodense lesions are too small to characterize. There is symmetric enhancement and excretion of contrast by both kidneys. There is no hydronephrosis on either side. The visualized ureters and urinary bladder appear unremarkable. Stomach/Bowel: There is sigmoid diverticulosis.  There is mild diffuse thickening of the sigmoid colon as well as mild inflammatory changes of the distal and terminal ileum. The inflammatory changes of the colon and distal small bowel are likely reactive to inflammatory process of the pelvis which is centered in the right hemipelvis around the appendix and to a lesser degree terminal ileum. The appendix is mildly dilated measuring 8 mm in diameter. There is a small stone in the midportion of the appendix. High attenuating content within the proximal lumen of the appendix may represent oral contrast or less likely small stones. Findings likely represent acute appendicitis with secondary and reactive inflammatory changes of the distal small bowel and colon. There is mild dilatation of multiple loops of small bowel measuring up to 2.8 cm, likely reactive ileus. No evidence of bowel obstruction. Vascular/Lymphatic: There is mild aortoiliac atherosclerotic disease. The IVC is unremarkable. No portal venous gas. There is no adenopathy. Reproductive: The uterus is grossly unremarkable. The left ovary is unremarkable as visualized. The right ovary is not well visualized. Other: None Musculoskeletal: No acute or significant osseous findings. IMPRESSION: Findings likely represent acute appendicitis with secondary inflammatory changes of the distal and terminal ileum as well as mild reactive inflammatory changes of the colon. Clinical correlation is recommended. No abscess. Electronically Signed   By: Elgie CollardArash  Radparvar M.D.   On: 01/08/2019 01:01     Assessment & Plan:  Breanna Lauberheresa D Bruce is a 63 y.o. female with acute appendicitis on CT scan.  -Lap appy now    All questions were answered to the satisfaction of the patient.  The risk and benefits of laparoscopic appendectomy were discussed including but not limited to bleeding, infection, risk of injury to other organ, risk of open procedure or larger case like hemicolectomy.  After careful consideration, [name] has decided to proceed.  She has no family with her but is going to call her husband after surgery.    [name] 01/08/2019, 7:39 AM

## 2019-01-08 NOTE — ED Notes (Signed)
Called Dr Henreitta Leber for NP Felicie Morn and also sent a page .

## 2019-01-08 NOTE — Progress Notes (Signed)
Resting in bed. States pain med effective. Refuses to use SCD's. Drinking ginger-ale without difficulty. Saltine crackers given. Tolerated well. Denies nausea. IV saline locked.

## 2019-01-08 NOTE — Anesthesia Preprocedure Evaluation (Addendum)
Anesthesia Evaluation  Patient identified by MRN, date of birth, ID band Patient awake    Reviewed: Allergy & Precautions, H&P , NPO status , Patient's Chart, lab work & pertinent test results  Airway Mallampati: III  TM Distance: >3 FB Neck ROM: Limited  Mouth opening: Limited Mouth Opening  Dental no notable dental hx.    Pulmonary COPD, Current Smoker,    Pulmonary exam normal breath sounds clear to auscultation       Cardiovascular Exercise Tolerance: Good negative cardio ROS   Rhythm:regular Rate:Normal     Neuro/Psych PSYCHIATRIC DISORDERS Anxiety Depression negative neurological ROS     GI/Hepatic negative GI ROS, Neg liver ROS,   Endo/Other  negative endocrine ROS  Renal/GU negative Renal ROS  negative genitourinary   Musculoskeletal   Abdominal   Peds  Hematology negative hematology ROS (+)   Anesthesia Other Findings Temporomandibular joint disorder (TMJ)  age 63 fractured TMJ - repaired by    Reproductive/Obstetrics negative OB ROS                            Anesthesia Physical Anesthesia Plan  ASA: II and emergent  Anesthesia Plan: General   Post-op Pain Management:    Induction:   PONV Risk Score and Plan:   Airway Management Planned:   Additional Equipment:   Intra-op Plan:   Post-operative Plan:   Informed Consent: I have reviewed the patients History and Physical, chart, labs and discussed the procedure including the risks, benefits and alternatives for the proposed anesthesia with the patient or authorized representative who has indicated his/her understanding and acceptance.     Dental Advisory Given  Plan Discussed with: CRNA  Anesthesia Plan Comments:         Anesthesia Quick Evaluation

## 2019-01-08 NOTE — Anesthesia Postprocedure Evaluation (Signed)
Anesthesia Post Note  Patient: [name]  Procedure(s) Performed: APPENDECTOMY LAPAROSCOPIC (N/A Abdomen)  Patient location during evaluation: PACU Anesthesia Type: General Level of consciousness: awake and patient cooperative Pain management: pain level controlled Vital Signs Assessment: post-procedure vital signs reviewed and stable Respiratory status: spontaneous breathing, nonlabored ventilation and respiratory function stable Cardiovascular status: blood pressure returned to baseline Postop Assessment: no apparent nausea or vomiting Anesthetic complications: no     Last Vitals:  Vitals:   01/08/19 0728 01/08/19 0920  BP: 118/71   Pulse: 86   Resp: 18   Temp: 36.9 C 37.1 C  SpO2: 94%     Last Pain:  Vitals:   01/08/19 0930  TempSrc:   PainSc: 7                  Milaya Hora J

## 2019-01-08 NOTE — Progress Notes (Signed)
Bed available. Report called to [facility]. Transferred to 306 via bed in good condition.

## 2019-01-08 NOTE — Progress Notes (Signed)
In pre-op 5. Waiting on bed availability. Needs to void. Ambulated to bathroom. Voided without difficulty. Returned to bed. C/O postop abd pain. Rates pain 8. Med as noted.

## 2019-01-09 LAB — URINALYSIS, ROUTINE W REFLEX MICROSCOPIC
Bilirubin Urine: NEGATIVE
Glucose, UA: NEGATIVE mg/dL
Ketones, ur: NEGATIVE mg/dL
Leukocytes, UA: NEGATIVE
Nitrite: NEGATIVE
Protein, ur: 30 mg/dL — AB
RBC / HPF: >50 RBC/hpf — ABNORMAL HIGH (ref 0–5)
Specific Gravity, Urine: 1.02 (ref 1.005–1.030)
pH: 6 (ref 5.0–8.0)

## 2019-01-09 LAB — HIV ANTIBODY (ROUTINE TESTING W REFLEX): HIV SCREEN 4TH GENERATION: NONREACTIVE

## 2019-01-09 MED ORDER — DEXTROSE-NACL 5-0.45 % IV SOLN
INTRAVENOUS | Status: DC
  Administered 2019-01-09 – 2019-01-12 (×4): via INTRAVENOUS

## 2019-01-09 MED ORDER — ENOXAPARIN SODIUM 40 MG/0.4ML ~~LOC~~ SOLN
40.0000 mg | SUBCUTANEOUS | Status: DC
  Administered 2019-01-10 – 2019-01-13 (×4): 40 mg via SUBCUTANEOUS
  Filled 2019-01-09 (×5): qty 0.4

## 2019-01-09 NOTE — Progress Notes (Signed)
[facility] Progress Note  1 Day Post-Op  Subjective: Tearful this AM. Feeling poorly. Says hurting.   Objective: Vital signs in last 24 hours: Temp:  [98.2 F (36.8 C)-100.3 F (37.9 C)] 100.3 F (37.9 C) (01/25 1535) Pulse Rate:  [74-89] 85 (01/25 1535) Resp:  [16-20] 19 (01/25 1535) BP: (107-120)/(65-85) 119/85 (01/25 1535) SpO2:  [88 %-94 %] 92 % (01/25 1535)    Intake/Output from previous day: 01/24 0701 - 01/25 0700 In: 1100 [I.V.:1100] Out: 55 [Urine:50; Blood:5] Intake/Output this shift: Total I/O In: 1869.4 [P.O.:480; I.V.:1389.4] Out: 300 [Urine:300]  General appearance: alert, cooperative and mild distress Resp: normal work breathing GI: soft, distended, tender, port sites c/d/i with dermabond  Lab Results:  Recent Labs    01/07/19 1812  WBC 18.5*  HGB 17.0*  HCT 52.4*  PLT 212   BMET Recent Labs    01/07/19 1812  NA 136  K 3.4*  CL 101  CO2 23  GLUCOSE 124*  BUN 15  CREATININE 0.82  CALCIUM 9.4    Studies/Results: Ct Abdomen Pelvis W Contrast  Result Date: 01/08/2019 CLINICAL DATA:  63 year old female with abdominal pain. EXAM: CT ABDOMEN AND PELVIS WITH CONTRAST TECHNIQUE: Multidetector CT imaging of the abdomen and pelvis was performed using the standard protocol following bolus administration of intravenous contrast. CONTRAST:  15mL ISOVUE-300 IOPAMIDOL (ISOVUE-300) INJECTION 61%, ISOVUE-300 IOPAMIDOL (ISOVUE-300) INJECTION 61% COMPARISON:  CT of the abdomen pelvis dated 08/05/2018 FINDINGS: Lower chest: The visualized lung bases are clear. No intra-abdominal free air.  No significant free fluid. Hepatobiliary: The liver is unremarkable. No intrahepatic biliary ductal dilatation. Cholecystectomy. Pancreas: Unremarkable. No pancreatic ductal dilatation or surrounding inflammatory changes. Spleen: Normal in size without focal abnormality. Adrenals/Urinary Tract: The adrenal glands are unremarkable. There are bilateral  renal cysts measuring up to 3 cm. Smaller hypodense lesions are too small to characterize. There is symmetric enhancement and excretion of contrast by both kidneys. There is no hydronephrosis on either side. The visualized ureters and urinary bladder appear unremarkable. Stomach/Bowel: There is sigmoid diverticulosis. There is mild diffuse thickening of the sigmoid colon as well as mild inflammatory changes of the distal and terminal ileum. The inflammatory changes of the colon and distal small bowel are likely reactive to inflammatory process of the pelvis which is centered in the right hemipelvis around the appendix and to a lesser degree terminal ileum. The appendix is mildly dilated measuring 8 mm in diameter. There is a small stone in the midportion of the appendix. High attenuating content within the proximal lumen of the appendix may represent oral contrast or less likely small stones. Findings likely represent acute appendicitis with secondary and reactive inflammatory changes of the distal small bowel and colon. There is mild dilatation of multiple loops of small bowel measuring up to 2.8 cm, likely reactive ileus. No evidence of bowel obstruction. Vascular/Lymphatic: There is mild aortoiliac atherosclerotic disease. The IVC is unremarkable. No portal venous gas. There is no adenopathy. Reproductive: The uterus is grossly unremarkable. The left ovary is unremarkable as visualized. The right ovary is not well visualized. Other: None Musculoskeletal: No acute or significant osseous findings. IMPRESSION: Findings likely represent acute appendicitis with secondary inflammatory changes of the distal and terminal ileum as well as mild reactive inflammatory changes of the colon. Clinical correlation is recommended. No abscess. Electronically Signed   By: Elgie Collard M.D.   On: 01/08/2019 01:01    Anti-infectives: Anti-infectives (From admission, onward)   Start  Dose/Rate Route Frequency Ordered Stop    01/08/19 0245  ciprofloxacin (CIPRO) IVPB 400 mg     400 mg 200 mL/hr over 60 Minutes Intravenous  Once 01/08/19 0240 01/08/19 0524   01/08/19 0245  metroNIDAZOLE (FLAGYL) IVPB 500 mg     500 mg 100 mL/hr over 60 Minutes Intravenous  Once 01/08/19 0240 01/08/19 0422      Assessment/Plan: Ms. Ulice BrilliantDrake is a 63 yo with perforated appendicitis s/p lap appendectomy. Doing fair but possible with some degree of ileus given distention. Also reporting not taking a lot of liquids and UOP being down. PRN for pain Diet as tolerated IVF ordered SCDs, added lovenox while inpatient, ambulate    LOS: 0 days    Lucretia RoersLindsay C Camille Dragan 01/09/2019'

## 2019-01-10 DIAGNOSIS — K3532 Acute appendicitis with perforation and localized peritonitis, without abscess: Secondary | ICD-10-CM | POA: Diagnosis present

## 2019-01-10 LAB — CBC WITH DIFFERENTIAL/PLATELET
Abs Immature Granulocytes: 0.1 10*3/uL — ABNORMAL HIGH (ref 0.00–0.07)
Basophils Absolute: 0 10*3/uL (ref 0.0–0.1)
Basophils Relative: 0 %
Eosinophils Absolute: 0.1 10*3/uL (ref 0.0–0.5)
Eosinophils Relative: 1 %
HCT: 45.5 % (ref 36.0–46.0)
Hemoglobin: 14.9 g/dL (ref 12.0–15.0)
IMMATURE GRANULOCYTES: 1 %
Lymphocytes Relative: 6 %
Lymphs Abs: 1 10*3/uL (ref 0.7–4.0)
MCH: 31.6 pg (ref 26.0–34.0)
MCHC: 32.7 g/dL (ref 30.0–36.0)
MCV: 96.6 fL (ref 80.0–100.0)
MONOS PCT: 6 %
Monocytes Absolute: 1.1 10*3/uL — ABNORMAL HIGH (ref 0.1–1.0)
Neutro Abs: 14.6 10*3/uL — ABNORMAL HIGH (ref 1.7–7.7)
Neutrophils Relative %: 86 %
Platelets: 243 10*3/uL (ref 150–400)
RBC: 4.71 MIL/uL (ref 3.87–5.11)
RDW: 13.3 % (ref 11.5–15.5)
WBC: 16.9 10*3/uL — ABNORMAL HIGH (ref 4.0–10.5)
nRBC: 0 % (ref 0.0–0.2)

## 2019-01-10 LAB — BASIC METABOLIC PANEL
ANION GAP: 11 (ref 5–15)
BUN: 9 mg/dL (ref 8–23)
CO2: 30 mmol/L (ref 22–32)
Calcium: 8.8 mg/dL — ABNORMAL LOW (ref 8.9–10.3)
Chloride: 92 mmol/L — ABNORMAL LOW (ref 98–111)
Creatinine, Ser: 0.52 mg/dL (ref 0.44–1.00)
GFR calc Af Amer: >60 mL/min (ref >60)
GFR calc non Af Amer: >60 mL/min (ref >60)
Glucose, Bld: 134 mg/dL — ABNORMAL HIGH (ref 70–99)
Potassium: 3 mmol/L — ABNORMAL LOW (ref 3.5–5.1)
Sodium: 133 mmol/L — ABNORMAL LOW (ref 135–145)

## 2019-01-10 MED ORDER — POTASSIUM CHLORIDE CRYS ER 20 MEQ PO TBCR
40.0000 meq | EXTENDED_RELEASE_TABLET | Freq: Two times a day (BID) | ORAL | Status: AC
  Administered 2019-01-10 (×2): 40 meq via ORAL
  Filled 2019-01-10 (×2): qty 2

## 2019-01-10 MED ORDER — DOCUSATE SODIUM 100 MG PO CAPS
100.0000 mg | ORAL_CAPSULE | Freq: Two times a day (BID) | ORAL | Status: DC
  Administered 2019-01-10 – 2019-01-14 (×9): 100 mg via ORAL
  Filled 2019-01-10 (×8): qty 1

## 2019-01-10 MED ORDER — CLONAZEPAM 0.5 MG PO TABS
1.0000 mg | ORAL_TABLET | Freq: Three times a day (TID) | ORAL | Status: DC
  Administered 2019-01-10 – 2019-01-14 (×13): 1 mg via ORAL
  Filled 2019-01-10 (×14): qty 2

## 2019-01-10 MED ORDER — PANTOPRAZOLE SODIUM 40 MG PO TBEC
40.0000 mg | DELAYED_RELEASE_TABLET | Freq: Two times a day (BID) | ORAL | Status: DC
  Administered 2019-01-10 – 2019-01-14 (×8): 40 mg via ORAL
  Filled 2019-01-10 (×8): qty 1

## 2019-01-10 NOTE — Progress Notes (Signed)
[facility] Progress Note  2 Days Post-Op  Subjective: Asking about when she can have meds. Having some indigestion. Feeling full and bloated. No BM or flatus.   Objective: Vital signs in last 24 hours: Temp:  [97.5 F (36.4 C)-100.3 F (37.9 C)] 97.5 F (36.4 C) (01/26 0535) Pulse Rate:  [85-101] 101 (01/26 0535) Resp:  [18-19] 18 (01/25 2152) BP: (112-141)/(71-85) 141/75 (01/26 0535) SpO2:  [82 %-94 %] 89 % (01/26 0814)    Intake/Output from previous day: 01/25 0701 - 01/26 0700 In: 2109.4 [P.O.:720; I.V.:1389.4] Out: 300 [Urine:300] Intake/Output this shift: Total I/O In: 1083 [P.O.:240; I.V.:843] Out: -   General appearance: alert, cooperative and no distress Resp: normal work breathing GI: soft, distended, appropriately tender, port sites c/d/i with dermabond  Lab Results:  Recent Labs    01/07/19 1812 01/10/19 0623  WBC 18.5* 16.9*  HGB 17.0* 14.9  HCT 52.4* 45.5  PLT 212 243   BMET Recent Labs    01/07/19 1812 01/10/19 0623  NA 136 133*  K 3.4* 3.0*  CL 101 92*  CO2 23 30  GLUCOSE 124* 134*  BUN 15 9  CREATININE 0.82 0.52  CALCIUM 9.4 8.8*    Assessment/Plan: Ms. Mateen is a 63 yo with perforated appendicitis POD 2 s/p lap appendectomy. Developing ileus but no vomiting or nausea.   PRN for pain Diet as tolerated Monitor for nausea/vomiting, may need NG If gets ileus would worry about potential intraabdominal abscess CBC in the AM IVF ordered SCDs, lovenox   LOS: 0 days    [name] 01/10/2019

## 2019-01-11 ENCOUNTER — Encounter (HOSPITAL_COMMUNITY): Payer: Self-pay | Admitting: General Surgery

## 2019-01-11 LAB — BASIC METABOLIC PANEL
Anion gap: 8 (ref 5–15)
BUN: 10 mg/dL (ref 8–23)
CHLORIDE: 94 mmol/L — AB (ref 98–111)
CO2: 31 mmol/L (ref 22–32)
Calcium: 8.6 mg/dL — ABNORMAL LOW (ref 8.9–10.3)
Creatinine, Ser: 0.57 mg/dL (ref 0.44–1.00)
GFR calc non Af Amer: >60 mL/min (ref >60)
Glucose, Bld: 122 mg/dL — ABNORMAL HIGH (ref 70–99)
Potassium: 4.3 mmol/L (ref 3.5–5.1)
Sodium: 133 mmol/L — ABNORMAL LOW (ref 135–145)

## 2019-01-11 LAB — CBC WITH DIFFERENTIAL/PLATELET
Abs Immature Granulocytes: 0.05 10*3/uL (ref 0.00–0.07)
Basophils Absolute: 0 10*3/uL (ref 0.0–0.1)
Basophils Relative: 0 %
Eosinophils Absolute: 0.1 10*3/uL (ref 0.0–0.5)
Eosinophils Relative: 1 %
HCT: 44.8 % (ref 36.0–46.0)
Hemoglobin: 14.7 g/dL (ref 12.0–15.0)
Immature Granulocytes: 1 %
Lymphocytes Relative: 9 %
Lymphs Abs: 0.9 10*3/uL (ref 0.7–4.0)
MCH: 31.5 pg (ref 26.0–34.0)
MCHC: 32.8 g/dL (ref 30.0–36.0)
MCV: 95.9 fL (ref 80.0–100.0)
Monocytes Absolute: 1.2 10*3/uL — ABNORMAL HIGH (ref 0.1–1.0)
Monocytes Relative: 13 %
NRBC: 0 % (ref 0.0–0.2)
Neutro Abs: 7.2 10*3/uL (ref 1.7–7.7)
Neutrophils Relative %: 76 %
Platelets: 278 10*3/uL (ref 150–400)
RBC: 4.67 MIL/uL (ref 3.87–5.11)
RDW: 13.4 % (ref 11.5–15.5)
WBC: 9.4 10*3/uL (ref 4.0–10.5)
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
## 2019-01-11 NOTE — Progress Notes (Signed)
[facility] Progress Note  3 Days Post-Op  Subjective: Still no BM. Just up and walking today.   Objective: Vital signs in last 24 hours: Temp:  [97.6 F (36.4 C)-98.4 F (36.9 C)] 97.6 F (36.4 C) (01/27 0454) Pulse Rate:  [83-105] 103 (01/27 0454) Resp:  [20] 20 (01/27 0454) BP: (128-131)/(71-73) 130/73 (01/27 0454) SpO2:  [89 %-93 %] 91 % (01/27 0750) Last BM Date: 01/08/19  Intake/Output from previous day: 01/26 0701 - 01/27 0700 In: 2587.1 [P.O.:720; I.V.:1867.1] Out: 300 [Urine:300] Intake/Output this shift: No intake/output data recorded.  General appearance: alert, cooperative and no distress Resp: normal work of breathing GI: soft, distended, port site c/d/i without erythema or drainage  Lab Results:  Recent Labs    01/10/19 0623 01/11/19 0507  WBC 16.9* 9.4  HGB 14.9 14.7  HCT 45.5 44.8  PLT 243 278   BMET Recent Labs    01/10/19 0623 01/11/19 0507  NA 133* 133*  K 3.0* 4.3  CL 92* 94*  CO2 30 31  GLUCOSE 134* 122*  BUN 9 10  CREATININE 0.52 0.57  CALCIUM 8.8* 8.6*    Assessment/Plan: [name] si a 63 yo POD 3 s/p lap appy for perforated appendicitis. Doing fair but only passing gas. Has only started walking today.  Walk more Diet as tolerated Stool softener Likely home tomorrow    LOS: 1 day    [name] 01/11/2019

## 2019-01-12 MED ORDER — POLYETHYLENE GLYCOL 3350 17 G PO PACK
17.0000 g | PACK | Freq: Every day | ORAL | Status: DC
  Administered 2019-01-12 – 2019-01-14 (×3): 17 g via ORAL
  Filled 2019-01-12 (×3): qty 1

## 2019-01-12 MED ORDER — BISACODYL 10 MG RE SUPP
10.0000 mg | Freq: Every day | RECTAL | Status: DC
  Administered 2019-01-12 – 2019-01-13 (×2): 10 mg via RECTAL
  Filled 2019-01-12 (×3): qty 1

## 2019-01-12 NOTE — Progress Notes (Signed)
Patient assisted to bathroom where she had a very small amount of liquid. Patient's IV was removed this afternoon at patient's request. Patient states the Dr said it could come out and I want it out.

## 2019-01-12 NOTE — Care Management Note (Signed)
Case Management Note  Patient Details  Name: [name] MRN: [id number] Date of Birth: 03-10-1956   If discussed at Long Length of Stay Meetings, dates discussed:  01/12/19  Additional Comments:  Brayden Betters, Chrystine Oiler, RN 01/12/2019, 12:10 PM

## 2019-01-12 NOTE — Progress Notes (Signed)
[facility] Progress Note  4 Days Post-Op  Subjective: No BM. Tolerating food. Some flatus but also burping.   Objective: Vital signs in last 24 hours: Temp:  [97.8 F (36.6 C)-99.3 F (37.4 C)] 97.8 F (36.6 C) (01/28 1332) Pulse Rate:  [94-105] 94 (01/28 1332) Resp:  [16-20] 20 (01/28 1332) BP: (114-148)/(66-95) 118/78 (01/28 1332) SpO2:  [90 %-95 %] 95 % (01/28 1332) Last BM Date: 01/08/19  Intake/Output from previous day: 01/27 0701 - 01/28 0700 In: 1516.8 [P.O.:480; I.V.:1036.8] Out: 1400 [Urine:1400] Intake/Output this shift: No intake/output data recorded.  General appearance: alert, cooperative and no distress Resp: normal work of breathing GI: soft, distended, appropriatey tender, port sites with dermabond  Lab Results:  Recent Labs    01/10/19 0623 01/11/19 0507  WBC 16.9* 9.4  HGB 14.9 14.7  HCT 45.5 44.8  PLT 243 278   BMET Recent Labs    01/10/19 0623 01/11/19 0507  NA 133* 133*  K 3.0* 4.3  CL 92* 94*  CO2 30 31  GLUCOSE 134* 122*  BUN 9 10  CREATININE 0.52 0.57  CALCIUM 8.8* 8.6*    Assessment/Plan: [name] is a 63 yo POD 4 s/p Lap appy for perforated appendicitis doing fair. Still with ileus.  Needs to have BM. Stop IV pain meds Stop IV fluids  Suppository and miralax today  CBC in the AM, if WBC going up, having fevers may need to do CT scan as abscess could be causing ileus  Ambulate   LOS: 2 days    [name] 01/12/2019

## 2019-01-13 ENCOUNTER — Inpatient Hospital Stay (HOSPITAL_COMMUNITY): Payer: Self-pay

## 2019-01-13 LAB — CBC WITH DIFFERENTIAL/PLATELET
ABS IMMATURE GRANULOCYTES: 0.09 10*3/uL — AB (ref 0.00–0.07)
Basophils Absolute: 0.1 10*3/uL (ref 0.0–0.1)
Basophils Relative: 1 %
Eosinophils Absolute: 0.2 10*3/uL (ref 0.0–0.5)
Eosinophils Relative: 2 %
HEMATOCRIT: 43.7 % (ref 36.0–46.0)
Hemoglobin: 14.3 g/dL (ref 12.0–15.0)
Immature Granulocytes: 1 %
LYMPHS ABS: 1.5 10*3/uL (ref 0.7–4.0)
Lymphocytes Relative: 12 %
MCH: 31.6 pg (ref 26.0–34.0)
MCHC: 32.7 g/dL (ref 30.0–36.0)
MCV: 96.5 fL (ref 80.0–100.0)
MONOS PCT: 12 %
Monocytes Absolute: 1.5 10*3/uL — ABNORMAL HIGH (ref 0.1–1.0)
Neutro Abs: 8.9 10*3/uL — ABNORMAL HIGH (ref 1.7–7.7)
Neutrophils Relative %: 72 %
Platelets: 368 10*3/uL (ref 150–400)
RBC: 4.53 MIL/uL (ref 3.87–5.11)
RDW: 13.3 % (ref 11.5–15.5)
WBC: 12.3 10*3/uL — ABNORMAL HIGH (ref 4.0–10.5)
nRBC: 0 % (ref 0.0–0.2)

## 2019-01-13 MED ORDER — IOPAMIDOL (ISOVUE-300) INJECTION 61%
50.0000 mL | Freq: Once | INTRAVENOUS | Status: AC | PRN
  Administered 2019-01-13: 30 mL via ORAL

## 2019-01-13 MED ORDER — IOPAMIDOL (ISOVUE-300) INJECTION 61%
100.0000 mL | Freq: Once | INTRAVENOUS | Status: AC | PRN
  Administered 2019-01-13: 80 mL via INTRAVENOUS

## 2019-01-13 MED ORDER — IOPAMIDOL (ISOVUE-300) INJECTION 61%: 50.0000 mL | Freq: Once | INTRAVENOUS | Status: DC | PRN

## 2019-01-13 MED ORDER — MORPHINE SULFATE (PF) 2 MG/ML IV SOLN
2.0000 mg | Freq: Once | INTRAVENOUS | Status: AC
  Administered 2019-01-13: 2 mg via INTRAVENOUS
  Filled 2019-01-13: qty 1

## 2019-01-13 NOTE — Progress Notes (Signed)
Reviewed CT and read.  No drainable collection. Ileus on CT. Atelectasis in lower lobes. No clinical signs of pneumonia.  Will CBC in am and dc home tomorrow. Hopefully contrast will help with moving bowels.  Algis Greenhouse

## 2019-01-13 NOTE — Progress Notes (Addendum)
Patient rested well with no issue. She has received 2 doses of oxycodone 10 mg PRN for abdominal pain. Pain medicines were effective. Will continue to monitor.

## 2019-01-13 NOTE — Progress Notes (Signed)
[facility] Progress Note  5 Days Post-Op  Subjective: Low grade fever, WBC up. Still distend, some degree of ileus. Concern intraabdominal abscess forming. Plan for CT today. Tearful still about everything. No nausea. Minor flatus, small BM with meds yesterday.   Objective: Vital signs in last 24 hours: Temp:  [97.8 F (36.6 C)-100 F (37.8 C)] 98 F (36.7 C) (01/29 0603) Pulse Rate:  [94-101] 101 (01/29 0603) Resp:  [20] 20 (01/29 0603) BP: (118-130)/(70-78) 130/70 (01/29 0603) SpO2:  [92 %-95 %] 94 % (01/29 0728) Last BM Date: 01/12/19  Intake/Output from previous day: 01/28 0701 - 01/29 0700 In: 672.5 [P.O.:180; I.V.:492.5] Out: -  Intake/Output this shift: No intake/output data recorded.  General appearance: alert, cooperative and no distress Resp: normal work of breathing GI: soft, distended, port sites healing, no erythema   Lab Results:  Recent Labs    01/11/19 0507 01/13/19 0413  WBC 9.4 12.3*  HGB 14.7 14.3  HCT 44.8 43.7  PLT 278 368   BMET Recent Labs    01/11/19 0507  NA 133*  K 4.3  CL 94*  CO2 31  GLUCOSE 122*  BUN 10  CREATININE 0.57  CALCIUM 8.6*   Assessment/Plan: [name] is a 63 yo POD 5 s/p Lap appy for perforated appendicitis doing fair. Still with ileus. CT today. Will see if has anything to drain or needs antibiotics for.  Diet for now   LOS: 3 days    [name] 01/13/2019

## 2019-01-14 LAB — CBC WITH DIFFERENTIAL/PLATELET
Abs Immature Granulocytes: 0.14 10*3/uL — ABNORMAL HIGH (ref 0.00–0.07)
Basophils Absolute: 0.1 10*3/uL (ref 0.0–0.1)
Basophils Relative: 1 %
Eosinophils Absolute: 0.1 10*3/uL (ref 0.0–0.5)
Eosinophils Relative: 1 %
HCT: 40 % (ref 36.0–46.0)
Hemoglobin: 13.2 g/dL (ref 12.0–15.0)
IMMATURE GRANULOCYTES: 1 %
Lymphocytes Relative: 10 %
Lymphs Abs: 1.6 10*3/uL (ref 0.7–4.0)
MCH: 32.3 pg (ref 26.0–34.0)
MCHC: 33 g/dL (ref 30.0–36.0)
MCV: 97.8 fL (ref 80.0–100.0)
Monocytes Absolute: 1.9 10*3/uL — ABNORMAL HIGH (ref 0.1–1.0)
Monocytes Relative: 12 %
Neutro Abs: 11.7 10*3/uL — ABNORMAL HIGH (ref 1.7–7.7)
Neutrophils Relative %: 75 %
Platelets: 410 10*3/uL — ABNORMAL HIGH (ref 150–400)
RBC: 4.09 MIL/uL (ref 3.87–5.11)
RDW: 13.2 % (ref 11.5–15.5)
WBC: 15.5 10*3/uL — ABNORMAL HIGH (ref 4.0–10.5)
nRBC: 0.1 % (ref 0.0–0.2)

## 2019-01-14 LAB — URINALYSIS, COMPLETE (UACMP) WITH MICROSCOPIC
Bacteria, UA: NONE SEEN
Bilirubin Urine: NEGATIVE
Glucose, UA: NEGATIVE mg/dL
KETONES UR: 80 mg/dL — AB
LEUKOCYTES UA: NEGATIVE
Nitrite: NEGATIVE
PROTEIN: 30 mg/dL — AB
Specific Gravity, Urine: 1.024 (ref 1.005–1.030)
pH: 5 (ref 5.0–8.0)

## 2019-01-14 MED ORDER — OXYCODONE HCL 5 MG PO TABS: 5.0000 mg | ORAL_TABLET | ORAL | 0 refills | Status: DC | PRN

## 2019-01-14 MED ORDER — NYSTATIN 100000 UNIT/ML MT SUSP
5.0000 mL | Freq: Four times a day (QID) | OROMUCOSAL | Status: DC
  Filled 2019-01-14: qty 5

## 2019-01-14 MED ORDER — ONDANSETRON 4 MG PO TBDP: 4.0000 mg | ORAL_TABLET | Freq: Four times a day (QID) | ORAL | 0 refills | Status: DC | PRN

## 2019-01-14 MED ORDER — NYSTATIN 100000 UNIT/ML MT SUSP: 5.0000 mL | Freq: Four times a day (QID) | OROMUCOSAL | 0 refills | Status: AC

## 2019-01-14 MED ORDER — NYSTATIN 100000 UNIT/ML MT SUSP
5.0000 mL | Freq: Four times a day (QID) | OROMUCOSAL | Status: DC
  Administered 2019-01-14: 500000 [IU] via ORAL
  Filled 2019-01-14: qty 5

## 2019-01-14 MED ORDER — FLUCONAZOLE 100 MG PO TABS
200.0000 mg | ORAL_TABLET | Freq: Once | ORAL | Status: AC
  Administered 2019-01-14: 200 mg via ORAL
  Filled 2019-01-14: qty 2

## 2019-01-14 NOTE — Progress Notes (Signed)
UA negative. Will await culture before sending any antibiotics.  Algis Greenhouse, MD [facility] [address] [place], [place] [address] [phone] (office)

## 2019-01-14 NOTE — Discharge Instructions (Signed)
Discharge Laparoscopic Surgery Instructions:  Common Complaints: Right shoulder pain is common after laparoscopic surgery. This is secondary to the gas used in the surgery being trapped under the diaphragm.  Walk to help your body absorb the gas. This will improve in a few days. Pain at the port sites are common, especially the larger port sites. This will improve with time.  Some nausea is common and poor appetite. The main goal is to stay hydrated the first few days after surgery.  If you are having a lot of diarrhea, let us know. Do not take any stool softener or miralax if you are having diarrhea.   Diet/ Activity: Diet as tolerated. You may not have an appetite, but it is important to stay hydrated. Drink 64 ounces of water a day. Your appetite will return with time.  Shower per your regular routine daily.  Do not take hot showers. Take warm showers that are less than 10 minutes. Rest and listen to your body, but do not remain in bed all day.  Walk everyday for at least 15-20 minutes. Deep cough and move around every 1-2 hours in the first few days after surgery.  Do not lift > 10 lbs, perform excessive bending, pushing, pulling, squatting for 1-2 weeks after surgery.  Do not pick at the dermabond glue on your incision sites.  This glue film will remain in place for 1-2 weeks and will start to peel off.  Do not place lotions or balms on your incision unless instructed to specifically by Dr. Henreitta Leber.   Medication: Take tylenol and ibuprofen as needed for pain control, alternating every 4-6 hours.  Example:  Tylenol 1000mg  @ 6am, 12noon, 6pm, (Do not exceed 4000mg  of tylenol a day). Ibuprofen 800mg  @ 9am, 3pm, 9pm, 3am (Do not exceed 3600mg  of ibuprofen a day).  Take Roxicodone for breakthrough pain every 4 hours.  Take Colace for constipation related to narcotic pain medication. If you do not have a bowel movement in 2 days, take Miralax over the counter.  Drink plenty of water  to also prevent constipation.   Contact Information: If you have questions or concerns, please call our office, [phone], Monday- Thursday 8AM-5PM and Friday 8AM-12Noon.  If it is after hours or on the weekend, please call [facility] Main [facility], [phone], and ask to speak to the surgeon on call for Dr. Henreitta Leber at [facility].     Laparoscopic Appendectomy, Adult, Care After This sheet gives you information about how to care for yourself after your procedure. Your doctor may also give you more specific instructions. If you have problems or questions, contact your doctor. What can I expect after the procedure? After the procedure, it is common to have:  Little energy for normal activities.  Mild pain in the area where the cuts from surgery (incisions) were made.  Trouble pooping (constipation). This can be caused by: ? Pain medicine. ? A lack of activity. Follow these instructions at home: Medicines  Take over-the-counter and prescription medicines only as told by your doctor.  If you were prescribed an antibiotic medicine, take it as told by your doctor. Do not stop taking it even if you start to feel better.  Do not drive or use heavy machinery while taking prescription pain medicine.  Ask your doctor if the medicine you are taking can cause trouble pooping. You may need to take steps to prevent or treat trouble pooping: ? Drink enough fluid to keep your pee (urine) pale yellow. ?  Take over-the-counter or prescription medicines. ? Eat foods that are high in fiber. These include beans, whole grains, and fresh fruits and vegetables. ? Limit foods that are high in fat and sugar. These include fried or sweet foods. Incision care   Follow instructions from your doctor about how to take care of your cuts from surgery. Make sure you: ? Wash your hands with soap and water before and after you change your bandage (dressing). If you cannot use soap and water, use hand  sanitizer. ? Change your bandage as told by your doctor. ? Leave stitches (sutures), skin glue, or skin tape (adhesive) strips in place. They may need to stay in place for 2 weeks or longer. If tape strips get loose and curl up, you may trim the loose edges. Do not remove tape strips completely unless your doctor says it is okay.  Check your cuts from surgery every day for signs of infection. Check for: ? Redness, swelling, or pain. ? Fluid or blood. ? Warmth. ? Pus or a bad smell. Bathing  Keep your cuts from surgery clean and dry. Clean them as told by your doctor. To do this: 1. Gently wash the cuts with soap and water. 2. Rinse the cuts with water to remove all soap. 3. Pat the cuts dry with a clean towel. Do not rub the cuts.  Do not take baths, swim, or use a hot tub for 2 weeks. You may shower.  Activity   Do not drive for 24 hours if you were given a medicine to help you relax (sedative) during your procedure.  Rest after the procedure. Return to your normal activities as told by your doctor. Ask your doctor what activities are safe for you.  For 3 weeks, or for as long as told by your doctor: ? Do not lift anything that is heavier than 10 lb (4.5 kg), or the limit that you are told. ? Do not play contact sports. General instructions  If you were sent home with a drain, follow instructions from your doctor on how to care for it.  Take deep breaths. This helps to keep your lungs from getting an infection (pneumonia).  Keep all follow-up visits as told by your doctor. This is important. Contact a doctor if:  You have redness, swelling, or pain around a cut from surgery.  You have fluid or blood coming from a cut.  Your cut feels warm to the touch.  You have pus or a bad smell coming from a cut or a bandage.  The edges of a cut break open after the stitches have been taken out.  You have pain in your shoulders that gets worse.  You feel dizzy or you pass out  (faint).  You have shortness of breath.  You keep feeling sick to your stomach (nauseous).  You keep throwing up (vomiting).  You get watery poop (diarrhea) or you cannot control your poop.  You lose your appetite.  You have swelling or pain in your legs.  You get a rash. Get help right away if:  You have a fever.  You have trouble breathing.  You have sharp pains in your chest. Summary  After the procedure, it is common to have low energy, mild pain, and trouble pooping.  Infection is a common problem after this procedure. Follow your doctor's instructions about caring for yourself after the procedure.  Rest after the procedure. Return to your normal activities as told by your doctor.  Contact  your doctor if you see signs of infection around your cuts from surgery, or you get short of breath. Get help right away if you have a fever, chest pain, or trouble breathing. This information is not intended to replace advice given to you by your health care provider. Make sure you discuss any questions you have with your health care provider. Document Released: 09/28/2009 Document Revised: 06/04/2018 Document Reviewed: 06/04/2018 Elsevier Interactive Patient Education  2019 [employer]

## 2019-01-14 NOTE — Discharge Summary (Signed)
Physician Discharge Summary  Patient ID: [name] MRN: [id number] DOB/AGE: [id number] y.o.  Admit date: 01/07/2019 Discharge date: 01/14/2019  Admission Diagnoses: Perforated appendicitis   Discharge Diagnoses:  Active Problems:   Acute appendicitis   Perforated appendicitis   Discharged Condition: fair  Hospital Course: [name] is a 63 yo with perforated appendicitis who developed a post operative ileus. She had issues with pain control and intake, and was also very bloated. On POD 5 she was noted to have an increasing WBC and a CT scan was performed to rule out an abscess that needed drainage. The CT showed no abscess or fluid collection. She was noted to have an ileus and some thickened bowel, which is likely reactive. There was also some concern for atelectasis versus pneumonia, but this was clinically felt to be atelectasis given her pain and lack of other symptoms like cough or fevers.  On her discharge day, she had been tolerating a diet, and had multiple loose BMs. She had diarrhea prior to her surgery also.  She was having better pain control and was ambulating. She did note some burning in her mouth and her vagina and was noted to have some yeast. She was given a dose of diflucan in the hospital and some swiss and swallow for her mouth.    A UA and culture were checked to make sure she had no UTI in addition. The UA was negative. Her WBC were still at 15 but her neutrophils where 75, and she had no fevers. I think the leukocytosis is mostly reactive in nature. I did warn her to call if she had any fevers or if the diarrhea continued as she could develop C dif.    Consults: None  Significant Diagnostic Studies: CT a/p- perforated appendicitis; CT a/p- ileus, enteritis, atelectasis   Treatments: IV hydration, antibiotics: Zosyn, analgesia: Morphine and Roxicodone and surgery: Lap appendectomy 01/08/2019  Discharge Exam: Blood pressure 116/65, pulse 87, temperature (!)  97.5 F (36.4 C), temperature source Oral, resp. rate 20, height 5\' 1"  (1.549 m), weight 46.3 kg, SpO2 96 %. General appearance: alert, cooperative and no distress Resp: normal work of breathing, no cough  GI: soft, mildly distended, appropriately tender, port sites healing with no erythema or drainage  Disposition: Discharge disposition: 01-Home or Self Care       Discharge Instructions    Call MD for:  difficulty breathing, headache or visual disturbances   Complete by:  As directed    Call MD for:  extreme fatigue   Complete by:  As directed    Call MD for:  persistant dizziness or light-headedness   Complete by:  As directed    Call MD for:  persistant nausea and vomiting   Complete by:  As directed    Call MD for:  redness, tenderness, or signs of infection (pain, swelling, redness, odor or green/yellow discharge around incision site)   Complete by:  As directed    Call MD for:  severe uncontrolled pain   Complete by:  As directed    Call MD for:  temperature >100.4   Complete by:  As directed    Diet - low sodium heart healthy   Complete by:  As directed    Increase activity slowly   Complete by:  As directed      Allergies as of 01/14/2019      Reactions   Nsaids    Advised not to take    Toradol [ketorolac Tromethamine]  Nausea And Vomiting, Swelling   TONGUE SWELLING   Tramadol Nausea And Vomiting   Benadryl [diphenhydramine Hcl] Palpitations   Insomnia   Ceftin [cefuroxime Axetil] Rash   Compazine Palpitations   Insomnia   Epinephrine Palpitations   Insomina      Medication List    TAKE these medications   albuterol 108 (90 Base) MCG/ACT inhaler Commonly known as:  PROVENTIL HFA;VENTOLIN HFA Inhale 1-2 puffs into the lungs every 6 (six) hours as needed for wheezing or shortness of breath.   Azelastine HCl 137 MCG/SPRAY Soln Place 1 spray into the nose daily as needed (for nasal congestion).   baclofen 10 MG tablet Commonly known as:  LIORESAL Take  10 mg by mouth 3 (three) times daily.   budesonide-formoterol 160-4.5 MCG/ACT inhaler Commonly known as:  SYMBICORT Inhale 2 puffs into the lungs 2 (two) times daily.   CENTRUM SILVER PO Take 1 tablet by mouth daily.   clonazePAM 1 MG tablet Commonly known as:  KLONOPIN Take 1 mg by mouth 3 (three) times daily. *May take one additional tablet as needed for anxiety/PTSD   ezetimibe 10 MG tablet Commonly known as:  ZETIA Take 10 mg by mouth daily.   fluticasone 50 MCG/ACT nasal spray Commonly known as:  FLONASE Place 2 sprays into both nostrils daily.   gabapentin 300 MG capsule Commonly known as:  NEURONTIN Take 300 mg by mouth 3 (three) times daily.   HORIZANT 600 MG Tbcr Generic drug:  Gabapentin Enacarbil Take 1 tablet by mouth at bedtime as needed (FOR PAIN/NEUROPATHY).   ipratropium 0.03 % nasal spray Commonly known as:  ATROVENT Place 1 spray into both nostrils daily as needed for rhinitis.   MURO 128 2 % ophthalmic solution Generic drug:  sodium chloride Place 1 drop into both eyes 2 (two) times daily.   MURO 128 5 % ophthalmic ointment Generic drug:  sodium chloride Place 1 drop into both eyes at bedtime.   nystatin 100000 UNIT/ML suspension Commonly known as:  MYCOSTATIN Take 5 mLs (500,000 Units total) by mouth 4 (four) times daily for 2 days.   ondansetron 4 MG disintegrating tablet Commonly known as:  ZOFRAN-ODT Take 1 tablet (4 mg total) by mouth every 6 (six) hours as needed for nausea.   oxyCODONE 5 MG immediate release tablet Commonly known as:  Oxy IR/ROXICODONE Take 1-2 tablets (5-10 mg total) by mouth every 4 (four) hours as needed for moderate pain.      Follow-up Information    [name], MD Follow up on 01/19/2019.   Specialty:  General Surgery Why:  @ 9:30AM  Contact information: [address] [place] [address] [phone]           Signed: [name] 01/14/2019, 12:54 PM

## 2019-01-15 LAB — URINE CULTURE: Culture: NO GROWTH

## 2019-01-19 ENCOUNTER — Ambulatory Visit (INDEPENDENT_AMBULATORY_CARE_PROVIDER_SITE_OTHER): Payer: Self-pay | Admitting: General Surgery

## 2019-01-19 ENCOUNTER — Encounter: Payer: Self-pay | Admitting: General Surgery

## 2019-01-19 VITALS — BP 130/70 | HR 90 | Temp 99.3°F | Resp 18 | Wt 105.0 lb

## 2019-01-19 DIAGNOSIS — K3532 Acute appendicitis with perforation and localized peritonitis, without abscess: Secondary | ICD-10-CM

## 2019-01-19 MED ORDER — OXYCODONE HCL 5 MG PO TABS: 5.0000 mg | ORAL_TABLET | ORAL | 0 refills | Status: DC | PRN

## 2019-01-19 NOTE — Patient Instructions (Addendum)
Continue activity and diet as tolerated. Continue to increase strength and movement.  Elevate feet. And if still swollen by Friday, call before 10 AM and we will call you in some lasix.

## 2019-01-19 NOTE — Progress Notes (Signed)
[facility] Note   HPI:  63 y.o. Female presents to clinic for post-op follow-up evaluation after laparoscopic appendectomy for perforated appendicitis. Patient reports more formed stools, decreased swelling and stinging in her vagina after the diflucan and decreased irritation in her mouth after nystatin. She is eating and denies any nausea/vomiting. Possible low grade subjective fevers but nothing recorded. UA and culture were negative.   Review of Systems:  ? Low grade fever Leg swelling bilaterally  Improving pain  All other review of systems: otherwise negative   Vital Signs:  BP 130/70 (BP Location: Left Arm, Patient Position: Sitting)   Pulse 90   Temp 99.3 F (37.4 C) (Temporal)   Resp 18   Wt 105 lb (47.6 kg)   BMI 19.84 kg/m    Physical Exam:  Physical Exam Vitals signs reviewed.  Constitutional:      Appearance: Normal appearance.  HENT:     Head: Normocephalic.  Neck:     Musculoskeletal: Normal range of motion.  Cardiovascular:     Rate and Rhythm: Normal rate.  Pulmonary:     Effort: Pulmonary effort is normal.     Breath sounds: Normal breath sounds.  Abdominal:     General: There is distension.     Palpations: Abdomen is soft.     Tenderness: There is abdominal tenderness.     Comments: Bruising from lovenox shots in the right abdomen, port sites healing with no erythema or drainage, tender around the sites, somewhat distended  Musculoskeletal: Normal range of motion.        General: Swelling present.  Skin:    General: Skin is warm and dry.  Neurological:     General: No focal deficit present.     Mental Status: She is alert and oriented to person, place, and time.  Psychiatric:        Mood and Affect: Mood normal.        Behavior: Behavior normal.        Thought Content: Thought content normal.        Judgment: Judgment normal.      Assessment:  63 y.o. yo Female recovering from a laparoscopic appendectomy for perforated  appendicitis with post operative ileus. Doing fair. Still with some pain.  Overall improving.   Plan:  - Roxicodone 5mg  q4 PRN for severe pain refill sent in   - Diet as tolerated and activity as tolerated  - If leg swelling persists after elevation, then call on Friday and can give a few lasix pills. The swelling is bilateral and probably just from post operative fluid retention.  - Will check on in a few weeks  -  Future Appointments  Date Time Provider Department Center  02/02/2019 11:00 AM [name], MD [facility] None  03/15/2019 11:00 AM [facility] CL[facility]IC [facility] None   All of the above recommendations were discussed with the patient and all of patient's questions were answered to her expressed satisfaction.  Algis Greenhouse, MD [facility] [address] [place], [place] [address] [phone] (office)

## 2019-01-22 ENCOUNTER — Other Ambulatory Visit: Payer: Self-pay | Admitting: Emergency Medicine

## 2019-01-22 ENCOUNTER — Telehealth: Payer: Self-pay | Admitting: Emergency Medicine

## 2019-01-22 MED ORDER — FUROSEMIDE 20 MG PO TABS: 20.0000 mg | ORAL_TABLET | Freq: Every day | ORAL | 0 refills | Status: DC

## 2019-01-22 NOTE — Telephone Encounter (Signed)
Patient called and stated she is still having swelling in her ankles and is having a cough with congestion, she stated she would like lasix and an antibiotic. After speaking with doctor bridges she gave me a verbal order to send in lasix 20mg  take one tablet daily for five days and if the swelling gets better she can discontinue it, she also stated  for the patient to f/u with her pcp for the cough and congestion. I notified the patient of this and she stated that she understood and that she would.

## 2019-02-02 ENCOUNTER — Ambulatory Visit (INDEPENDENT_AMBULATORY_CARE_PROVIDER_SITE_OTHER): Payer: Self-pay | Admitting: General Surgery

## 2019-02-02 ENCOUNTER — Encounter: Payer: Self-pay | Admitting: General Surgery

## 2019-02-02 VITALS — BP 114/68 | HR 77 | Temp 97.8°F | Resp 20 | Wt 95.8 lb

## 2019-02-02 DIAGNOSIS — B370 Candidal stomatitis: Secondary | ICD-10-CM

## 2019-02-02 DIAGNOSIS — K3532 Acute appendicitis with perforation and localized peritonitis, without abscess: Secondary | ICD-10-CM

## 2019-02-02 MED ORDER — FLUCONAZOLE 100 MG PO TABS: 100.0000 mg | ORAL_TABLET | Freq: Every day | ORAL | 0 refills | Status: AC

## 2019-02-02 NOTE — Progress Notes (Signed)
[facility] Note   HPI:  63 y.o. Female presents to clinic for follow-up evaluation for perforated appendicitis s/p appendectomy. She has been doing fair but has been having issues with loose stools after going off of colace and being on fiber.  She says that she also having some thrush in her mouth again. We had treated her with some diflucan and swiss and swallow during the admission/ right after discharge.    Review of Systems:  Diarrhea  Thrush Pain improving  All other review of systems: otherwise negative   Vital Signs:  BP 114/68 (BP Location: Left Arm, Patient Position: Sitting, Cuff Size: Normal)   Pulse 77   Temp 97.8 F (36.6 C) (Temporal)   Resp 20   Wt 95 lb 12.8 oz (43.5 kg)   BMI 18.10 kg/m    Physical Exam:  Physical Exam Vitals signs reviewed.  HENT:     Head: Normocephalic.  Cardiovascular:     Rate and Rhythm: Normal rate.  Pulmonary:     Effort: Pulmonary effort is normal.  Abdominal:     General: There is no distension.     Palpations: Abdomen is soft.     Tenderness: There is no abdominal tenderness.     Comments: Sites healed   Musculoskeletal:        General: No swelling.  Skin:    General: Skin is warm.  Neurological:     General: No focal deficit present.     Mental Status: She is alert and oriented to person, place, and time.  Psychiatric:        Mood and Affect: Mood normal.        Behavior: Behavior normal.        Thought Content: Thought content normal.        Judgment: Judgment normal.      Assessment:  63 y.o. yo Female s/p lap appendectomy for perforated appendicitis. Complicated by patient having post operative ileus and pain. She now having some diarrhea and thrush after antibiotics.   Plan:  Diet and activity as tolerated. Recommend stopping the fiber and colace and see how stools are doing. If needed add back the colace.  Get sample of stool for C dif, she wants to get it at her PCP as she is on a  sliding scale, this is fine and she will let us know if the PCP cannot order  Treating the oral yeast infection.   Future Appointments  Date Time Provider Department Center  02/16/2019 11:00 AM [name], MD [facility] None  03/15/2019 11:00 AM [facility] CL[facility] None    All of the above recommendations were discussed with the patient and all of patient's questions were answered to her expressed satisfaction.  Algis Greenhouse, MD [facility] [address] [place], [place] [address] [phone] (office)

## 2019-02-02 NOTE — Patient Instructions (Addendum)
Diet and activity as tolerated. Recommend stopping the fiber and colace and see how stools are doing. If needed add back the colace.  Get sample of stool for C dif.  Treating the oral yeast infection.

## 2019-02-03 ENCOUNTER — Telehealth: Payer: Self-pay | Admitting: Emergency Medicine

## 2019-02-03 NOTE — Telephone Encounter (Signed)
Spoke with patient and notified her that I spoke with kim the nurse at her pcps office and she stated patient pick up items to give a stool specimen to check for c-diff tomorrow morning, patient stated she would and thanked me for the call

## 2019-02-16 ENCOUNTER — Ambulatory Visit: Payer: Self-pay | Admitting: General Surgery

## 2019-02-23 ENCOUNTER — Encounter: Payer: Self-pay | Admitting: General Surgery

## 2019-02-23 ENCOUNTER — Ambulatory Visit (INDEPENDENT_AMBULATORY_CARE_PROVIDER_SITE_OTHER): Payer: Self-pay | Admitting: General Surgery

## 2019-02-23 VITALS — BP 122/74 | HR 62 | Temp 98.0°F | Resp 16 | Wt 92.4 lb

## 2019-02-23 DIAGNOSIS — K3532 Acute appendicitis with perforation and localized peritonitis, without abscess: Secondary | ICD-10-CM

## 2019-02-23 DIAGNOSIS — B370 Candidal stomatitis: Secondary | ICD-10-CM

## 2019-02-23 NOTE — Patient Instructions (Signed)
Find new PCP at [facility] Department. Diet and activity as tolerated.

## 2019-02-23 NOTE — Progress Notes (Signed)
[facility] Note   HPI:  63 y.o. Female presents to clinic for follow-up evaluation after perforated appendicitis s/p laparoscopic appendectomy. She has been doing fair. She is tearful. Her C dif was negative but she continues with diarrhea. She reports that she is eating ok. Her oral candidiasis has cleared.   Review of Systems:  No fever or chills Diarrhea continues Some LLQ pain  All other review of systems: otherwise negative   Vital Signs:  BP 122/74 (BP Location: Left Arm, Patient Position: Sitting, Cuff Size: Normal)   Pulse 62   Temp 98 F (36.7 C) (Temporal)   Resp 16   Wt 92 lb 6.4 oz (41.9 kg)   BMI 17.46 kg/m    Physical Exam:  Physical Exam Vitals signs reviewed.  HENT:     Head: Normocephalic.  Cardiovascular:     Rate and Rhythm: Normal rate.  Pulmonary:     Effort: Pulmonary effort is normal.  Abdominal:     General: There is no distension.     Palpations: Abdomen is soft.     Tenderness: There is no abdominal tenderness.     Comments: Port sites healed  Musculoskeletal: Normal range of motion.  Skin:    General: Skin is warm and dry.  Neurological:     General: No focal deficit present.     Mental Status: She is alert and oriented to person, place, and time.    Assessment:  [name], Breanna by post op oral candidiasis. C dif was negative. I cannot explain her continued diarrhea. She is healed from a surgical standpoint.   Plan:  Recommended possibly looking into seeing GI for the diarrhea, she could have some degree of IBS Recommended continued diet and increasing intake Recommended working on her emotional health as she was tearful today and this could play a role in her overall health Diet and activity as tolerated. PRN follow up  All of the above recommendations were discussed with the patient, and all of patient's questions were answered to  her expressed satisfaction.  Algis Greenhouse, MD [facility] [address] [place], [place] [address] [phone] (office)

## 2019-02-25 ENCOUNTER — Encounter (HOSPITAL_COMMUNITY): Payer: Self-pay

## 2019-02-25 ENCOUNTER — Other Ambulatory Visit: Payer: Self-pay

## 2019-02-25 ENCOUNTER — Emergency Department (HOSPITAL_COMMUNITY): Payer: Self-pay

## 2019-02-25 ENCOUNTER — Emergency Department (HOSPITAL_COMMUNITY)
Admission: EM | Admit: 2019-02-25 | Discharge: 2019-02-25 | Disposition: A | Discharge status: Home / Self Care | Payer: Self-pay | Attending: Emergency Medicine | Admitting: Emergency Medicine

## 2019-02-25 DIAGNOSIS — F17210 Nicotine dependence, cigarettes, uncomplicated: Secondary | ICD-10-CM | POA: Insufficient documentation

## 2019-02-25 DIAGNOSIS — Y93K1 Activity, walking an animal: Secondary | ICD-10-CM | POA: Insufficient documentation

## 2019-02-25 DIAGNOSIS — Z79899 Other long term (current) drug therapy: Secondary | ICD-10-CM | POA: Insufficient documentation

## 2019-02-25 DIAGNOSIS — X58XXXA Exposure to other specified factors, initial encounter: Secondary | ICD-10-CM | POA: Insufficient documentation

## 2019-02-25 DIAGNOSIS — M542 Cervicalgia: Secondary | ICD-10-CM | POA: Insufficient documentation

## 2019-02-25 DIAGNOSIS — Y929 Unspecified place or not applicable: Secondary | ICD-10-CM | POA: Insufficient documentation

## 2019-02-25 DIAGNOSIS — S46911A Strain of unspecified muscle, fascia and tendon at shoulder and upper arm level, right arm, initial encounter: Secondary | ICD-10-CM | POA: Insufficient documentation

## 2019-02-25 DIAGNOSIS — Y999 Unspecified external cause status: Secondary | ICD-10-CM | POA: Insufficient documentation

## 2019-02-25 MED ORDER — DIAZEPAM 5 MG PO TABS: 5.0000 mg | ORAL_TABLET | Freq: Two times a day (BID) | ORAL | 0 refills | Status: DC

## 2019-02-25 MED ORDER — OXYCODONE-ACETAMINOPHEN 5-325 MG PO TABS
1.0000 | ORAL_TABLET | Freq: Once | ORAL | Status: AC
  Administered 2019-02-25: 1 via ORAL
  Filled 2019-02-25: qty 1

## 2019-02-25 NOTE — ED Triage Notes (Signed)
Pt presents to ED with right shoulder pain after walking her dog today and the dog jerked her and the leash.

## 2019-02-25 NOTE — ED Provider Notes (Signed)
[facility] EMERGENCY DEPARTMENT Provider Note   CSN: [id number] Arrival date & time: 02/25/19  1556    History   Chief Complaint Chief Complaint  Patient presents with  . Shoulder Pain    HPI [name] is a 63 y.o. female.     Patient states she was walking her dog earlier today. The dog lunged several times, injuring the patient's right shoulder and straining her neck. Patient is complaining of a burning pain in her right shoulder/scapula and the right lateral aspect of her neck.  The history is provided by the patient. No language interpreter was used.  Shoulder Pain  Location:  Shoulder Shoulder location:  R shoulder Pain details:    Quality:  Burning   Severity:  Moderate   Onset quality:  Sudden   Duration:  1 day   Timing:  Constant   Progression:  Unchanged Handedness:  Right-handed Dislocation: no   Ineffective treatments:  Arthritis medication and NSAIDs   Past Medical History:  Diagnosis Date  . Chronic pain    gneralized denies fibromyalgia  . COPD (chronic obstructive pulmonary disease) (HCC)   . Depression   . Diverticulosis   . IBS (irritable bowel syndrome)   . Panic disorder   . Panic disorder   . PTSD (post-traumatic stress disorder)   . PTSD (post-traumatic stress disorder) 01/08/2019  . Temporomandibular joint disorder (TMJ)    age 55 fractured TMJ - repaired by oral surgeon    Patient Active Problem List   Diagnosis Date Noted  . Oral candidiasis 02/02/2019  . Perforated appendicitis 01/10/2019  . Acute appendicitis 01/08/2019    Past Surgical History:  Procedure Laterality Date  . CHOLECYSTECTOMY    . EYE SURGERY    . FRACTURE SURGERY    . LAPAROSCOPIC APPENDECTOMY N/A 01/08/2019   Procedure: APPENDECTOMY LAPAROSCOPIC;  Surgeon: [name], MD;  Location: [facility];  Service: General;  Laterality: N/A;     OB History    Gravida      Para      Term      Preterm      AB      Living  0     SAB      TAB       Ectopic      Multiple      Live Births               Home Medications    Prior to Admission medications   Medication Sig Start Date End Date Taking? Authorizing Provider  albuterol (PROVENTIL HFA;VENTOLIN HFA) 108 (90 Base) MCG/ACT inhaler Inhale 1-2 puffs into the lungs every 6 (six) hours as needed for wheezing or shortness of breath.    [provider]  Azelastine HCl 137 MCG/SPRAY SOLN Place 1 spray into the nose daily as needed (for nasal congestion).  10/27/18   [provider]  baclofen (LIORESAL) 10 MG tablet Take 10 mg by mouth 3 (three) times daily.  07/08/18   [provider]  budesonide-formoterol (SYMBICORT) 160-4.5 MCG/ACT inhaler Inhale 2 puffs into the lungs 2 (two) times daily.    [provider]  clonazePAM (KLONOPIN) 1 MG tablet Take 1 mg by mouth 3 (three) times daily. *May take one additional tablet as needed for anxiety/PTSD    [provider]  ezetimibe (ZETIA) 10 MG tablet Take 10 mg by mouth daily.    [provider]  fluticasone (FLONASE) 50 MCG/ACT nasal spray Place 2 sprays  into both nostrils daily.    [provider]  furosemide (LASIX) 20 MG tablet Take 1 tablet (20 mg total) by mouth daily. 01/22/19   [name], MD  gabapentin (NEURONTIN) 300 MG capsule Take 300 mg by mouth 3 (three) times daily.     [provider]  Gabapentin Enacarbil (HORIZANT) 600 MG TBCR Take 1 tablet by mouth at bedtime as needed (FOR PAIN/NEUROPATHY).     [provider]  HYDROcodone-homatropine (HYCODAN) 5-1.5 MG/5ML syrup Take 5 mLs by mouth every 6 (six) hours as needed for cough.    [provider]  ipratropium (ATROVENT) 0.03 % nasal spray Place 1 spray into both nostrils daily as needed for rhinitis.  10/12/18   [provider]  Multiple Vitamins-Minerals (CENTRUM SILVER PO) Take 1 tablet by mouth daily.    [provider]  MURO 128 2 % ophthalmic solution  Place 1 drop into both eyes 2 (two) times daily. 11/10/18   [provider]  MURO 128 5 % ophthalmic ointment Place 1 drop into both eyes at bedtime. 11/10/18   [provider]    Family History Family History  Problem Relation Age of Onset  . Breast cancer Neg Hx     Social History Social History   Tobacco Use  . Smoking status: Current Every Day Smoker    Packs/day: 0.50    Types: Cigarettes  . Smokeless tobacco: Never Used  Substance Use Topics  . Alcohol use: No  . Drug use: No     Allergies   Nsaids; Toradol [ketorolac tromethamine]; Tramadol; Benadryl [diphenhydramine hcl]; Ceftin [cefuroxime axetil]; Compazine; and Epinephrine   Review of Systems Review of Systems  Musculoskeletal: Positive for arthralgias and neck stiffness.  All other systems reviewed and are negative.    Physical Exam Updated Vital Signs BP (!) 122/109 (BP Location: Left Arm)   Pulse 90   Temp 97.9 F (36.6 C)   Resp 17   Ht 5\' 1"  (1.549 m)   Wt 41.9 kg   SpO2 93%   BMI 17.46 kg/m   Physical Exam Vitals signs and nursing note reviewed.  HENT:     Head: Atraumatic.  Eyes:     Conjunctiva/sclera: Conjunctivae normal.  Neck:     Musculoskeletal: Normal range of motion and neck supple.  Cardiovascular:     Rate and Rhythm: Normal rate and regular rhythm.  Pulmonary:     Effort: Pulmonary effort is normal.     Breath sounds: Normal breath sounds.  Musculoskeletal: Normal range of motion.        General: Tenderness present. No deformity.  Skin:    General: Skin is warm and dry.  Neurological:     Mental Status: She is alert and oriented to person, place, and time.     Sensory: No sensory deficit.  Psychiatric:        Mood and Affect: Mood normal.      ED Treatments / Results  Labs (all labs ordered are listed, but only abnormal results are displayed) Labs Reviewed - No data to display  EKG None  Radiology Dg Shoulder Right  Result Date:  02/25/2019 CLINICAL DATA:  Injury.  Right shoulder pain EXAM: RIGHT SHOULDER - 2+ VIEW COMPARISON:  05/05/2018 FINDINGS: There is no evidence of fracture or dislocation. There is no evidence of arthropathy or other focal bone abnormality. Soft tissues are unremarkable. IMPRESSION: Negative. Electronically Signed   By: Marlan Palau M.D.   On: 02/25/2019 16:57  Procedures Procedures (including critical care time)  Medications Ordered in ED Medications - No data to display   Initial Impression / Assessment and Plan / ED Course  I have reviewed the triage vital signs and the nursing notes.  Pertinent labs & imaging results that were available during my care of the patient were reviewed by me and considered in my medical decision making (see chart for details).        Patient X-Ray negative for obvious fracture or dislocation.  Pt advised to follow up with orthopedics. Patient given soft collar for neck discomfort while in ED, conservative therapy recommended and discussed. Diazepam for muscle spasms. Patient will be discharged home & is agreeable with above plan. Returns precautions discussed. Pt appears safe for discharge.  Final Clinical Impressions(s) / ED Diagnoses   Final diagnoses:  Strain of right shoulder, initial encounter  Musculoskeletal neck pain    ED Discharge Orders         Ordered    diazepam (VALIUM) 5 MG tablet  2 times daily     02/25/19 1810           Felicie Morn, NP 02/25/19 2131    Samuel Jester, DO 02/26/19 2315

## 2019-03-15 ENCOUNTER — Ambulatory Visit: Payer: Self-pay

## 2019-06-12 ENCOUNTER — Other Ambulatory Visit: Payer: Self-pay

## 2019-06-12 ENCOUNTER — Emergency Department (HOSPITAL_COMMUNITY): Payer: Self-pay

## 2019-06-12 ENCOUNTER — Emergency Department (HOSPITAL_COMMUNITY)
Admission: EM | Admit: 2019-06-12 | Discharge: 2019-06-12 | Disposition: A | Discharge status: Home / Self Care | Payer: Self-pay | Attending: Emergency Medicine | Admitting: Emergency Medicine

## 2019-06-12 ENCOUNTER — Encounter (HOSPITAL_COMMUNITY): Payer: Self-pay | Admitting: Emergency Medicine

## 2019-06-12 DIAGNOSIS — J449 Chronic obstructive pulmonary disease, unspecified: Secondary | ICD-10-CM | POA: Insufficient documentation

## 2019-06-12 DIAGNOSIS — Z9049 Acquired absence of other specified parts of digestive tract: Secondary | ICD-10-CM | POA: Insufficient documentation

## 2019-06-12 DIAGNOSIS — Z79899 Other long term (current) drug therapy: Secondary | ICD-10-CM | POA: Insufficient documentation

## 2019-06-12 DIAGNOSIS — F17210 Nicotine dependence, cigarettes, uncomplicated: Secondary | ICD-10-CM | POA: Insufficient documentation

## 2019-06-12 DIAGNOSIS — M542 Cervicalgia: Secondary | ICD-10-CM | POA: Insufficient documentation

## 2019-06-12 DIAGNOSIS — R55 Syncope and collapse: Secondary | ICD-10-CM

## 2019-06-12 LAB — CBC WITH DIFFERENTIAL/PLATELET
Abs Immature Granulocytes: 0.01 10*3/uL (ref 0.00–0.07)
Basophils Absolute: 0.1 10*3/uL (ref 0.0–0.1)
Basophils Relative: 1 %
Eosinophils Absolute: 0.3 10*3/uL (ref 0.0–0.5)
Eosinophils Relative: 4 %
HCT: 43.3 % (ref 36.0–46.0)
Hemoglobin: 14.1 g/dL (ref 12.0–15.0)
Immature Granulocytes: 0 %
Lymphocytes Relative: 35 %
Lymphs Abs: 2.7 10*3/uL (ref 0.7–4.0)
MCH: 30.8 pg (ref 26.0–34.0)
MCHC: 32.6 g/dL (ref 30.0–36.0)
MCV: 94.5 fL (ref 80.0–100.0)
Monocytes Absolute: 0.6 10*3/uL (ref 0.1–1.0)
Monocytes Relative: 8 %
Neutro Abs: 4.1 10*3/uL (ref 1.7–7.7)
Neutrophils Relative %: 52 %
Platelets: 199 10*3/uL (ref 150–400)
RBC: 4.58 MIL/uL (ref 3.87–5.11)
RDW: 14.4 % (ref 11.5–15.5)
WBC: 7.8 10*3/uL (ref 4.0–10.5)
nRBC: 0 % (ref 0.0–0.2)

## 2019-06-12 LAB — COMPREHENSIVE METABOLIC PANEL
ALT: 22 U/L (ref 0–44)
AST: 28 U/L (ref 15–41)
Albumin: 4 g/dL (ref 3.5–5.0)
Alkaline Phosphatase: 78 U/L (ref 38–126)
Anion gap: 10 (ref 5–15)
BUN: 10 mg/dL (ref 8–23)
CO2: 30 mmol/L (ref 22–32)
Calcium: 9.4 mg/dL (ref 8.9–10.3)
Chloride: 99 mmol/L (ref 98–111)
Creatinine, Ser: 0.56 mg/dL (ref 0.44–1.00)
GFR calc Af Amer: >60 mL/min (ref >60)
GFR calc non Af Amer: >60 mL/min (ref >60)
Glucose, Bld: 115 mg/dL — ABNORMAL HIGH (ref 70–99)
Potassium: 3.5 mmol/L (ref 3.5–5.1)
Sodium: 139 mmol/L (ref 135–145)
Total Bilirubin: 0.6 mg/dL (ref 0.3–1.2)
Total Protein: 7.1 g/dL (ref 6.5–8.1)

## 2019-06-12 MED ORDER — SODIUM CHLORIDE 0.9 % IV BOLUS
500.0000 mL | Freq: Once | INTRAVENOUS | Status: AC
  Administered 2019-06-12: 16:00:00 500 mL via INTRAVENOUS

## 2019-06-12 MED ORDER — ACETAMINOPHEN 325 MG PO TABS
650.0000 mg | ORAL_TABLET | Freq: Once | ORAL | Status: AC
  Administered 2019-06-12: 650 mg via ORAL
  Filled 2019-06-12: qty 2

## 2019-06-12 MED ORDER — HYDROCODONE-ACETAMINOPHEN 5-325 MG PO TABS: 1.0000 | ORAL_TABLET | Freq: Four times a day (QID) | ORAL | 0 refills | Status: DC | PRN

## 2019-06-12 NOTE — ED Triage Notes (Addendum)
Patient c/o syncopal episode, unwitnessed x3 days ago. Per patient was standing at the foot of bed packing a bag when she felt herself go down and woke on the floor. Patient unsure of how long she was unconscious. Patient states woke on the couch the next day and had some confusion. Patient states slipped and fell yesterday hitting head in bathroom. Denies any LOC after fall. Patient c/o neck pain and soreness in face. Per patient hit left side of face on toilet with slip and fall. Patient states multiple bruises. Denies taking any type of anticoagulants. Patient alert and oriented at this time. Denies headache but reports some dizziness with movement.

## 2019-06-12 NOTE — Discharge Instructions (Addendum)
Follow-up with your doctor in 1 to 2 weeks for recheck 

## 2019-06-12 NOTE — ED Provider Notes (Signed)
[facility] EMERGENCY DEPARTMENT Provider Note   CSN: [id number] Arrival date & time: 06/12/19  1449     History   Chief Complaint Chief Complaint  Patient presents with  . Loss of Consciousness    HPI Lajoyce Lauberheresa D Moster is a 63 y.o. female.     Patient dates she slipped and fell on her hand.  No loss of consciousness  The history is provided by the patient.  Fall This is a new problem. The current episode started 12 to 24 hours ago. The problem occurs rarely. The problem has been resolved. Pertinent negatives include no chest pain, no abdominal pain and no headaches. Nothing aggravates the symptoms. Nothing relieves the symptoms. She has tried nothing for the symptoms.    Past Medical History:  Diagnosis Date  . Chronic pain    gneralized denies fibromyalgia  . COPD (chronic obstructive pulmonary disease) (HCC)   . Depression   . Diverticulosis   . IBS (irritable bowel syndrome)   . Panic disorder   . Panic disorder   . PTSD (post-traumatic stress disorder)   . PTSD (post-traumatic stress disorder) 01/08/2019  . Temporomandibular joint disorder (TMJ)    age [age] fractured TMJ - repaired by oral surgeon    Patient Active Problem List   Diagnosis Date Noted  . Oral candidiasis 02/02/2019  . Perforated appendicitis 01/10/2019  . Acute appendicitis 01/08/2019    Past Surgical History:  Procedure Laterality Date  . CHOLECYSTECTOMY    . EYE SURGERY    . FRACTURE SURGERY    . LAPAROSCOPIC APPENDECTOMY N/A 01/08/2019   Procedure: APPENDECTOMY LAPAROSCOPIC;  Surgeon: Lucretia RoersBridges, Lindsay C, MD;  Location: [facility];  Service: General;  Laterality: N/A;     OB History    Gravida  0   Para  0   Term  0   Preterm  0   AB  0   Living  0     SAB  0   TAB  0   Ectopic  0   Multiple  0   Live Births  0            Home Medications    Prior to Admission medications   Medication Sig Start Date End Date Taking? Authorizing Provider  albuterol (PROVENTIL  HFA;VENTOLIN HFA) 108 (90 Base) MCG/ACT inhaler Inhale 1-2 puffs into the lungs every 6 (six) hours as needed for wheezing or shortness of breath.   Yes [provider]  Azelastine HCl 137 MCG/SPRAY SOLN Place 1 spray into the nose daily as needed (for nasal congestion).  10/27/18  Yes [provider]  baclofen (LIORESAL) 10 MG tablet Take 10 mg by mouth 3 (three) times daily.  07/08/18  Yes [provider]  budesonide-formoterol (SYMBICORT) 160-4.5 MCG/ACT inhaler Inhale 2 puffs into the lungs 2 (two) times daily.   Yes [provider]  clonazePAM (KLONOPIN) 1 MG tablet Take 1 mg by mouth 3 (three) times daily. *May take one additional tablet as needed for anxiety/PTSD   Yes [provider]  diazepam (VALIUM) 5 MG tablet Take 1 tablet (5 mg total) by mouth 2 (two) times daily. 02/25/19  Yes Felicie MornSmith, David, NP  ezetimibe (ZETIA) 10 MG tablet Take 10 mg by mouth daily.   Yes [provider]  fluticasone (FLONASE) 50 MCG/ACT nasal spray Place 2 sprays into both nostrils daily.   Yes [provider]  furosemide (LASIX) 20 MG tablet Take 1 tablet (20 mg total) by mouth daily.  01/22/19  Yes Lucretia RoersBridges, Lindsay C, MD  gabapentin (NEURONTIN) 300 MG capsule Take 300 mg by mouth 3 (three) times daily.    Yes [provider]  Gabapentin Enacarbil (HORIZANT) 600 MG TBCR Take 1 tablet by mouth at bedtime as needed (FOR PAIN/NEUROPATHY).    Yes [provider]  ipratropium (ATROVENT) 0.03 % nasal spray Place 1 spray into both nostrils daily as needed for rhinitis.  10/12/18  Yes [provider]  Multiple Vitamins-Minerals (CENTRUM SILVER PO) Take 1 tablet by mouth daily.   Yes [provider]  MURO 128 2 % ophthalmic solution Place 1 drop into both eyes 2 (two) times daily. 11/10/18  Yes [provider]  MURO 128 5 % ophthalmic ointment Place 1 drop into both eyes at bedtime. 11/10/18  Yes [provider]   HYDROcodone-acetaminophen (NORCO/VICODIN) 5-325 MG tablet Take 1 tablet by mouth every 6 (six) hours as needed. 06/12/19   Bethann BerkshireZammit, Justine Dines, MD  HYDROcodone-homatropine [facility](HYCODAN) 5-1.5 MG/5ML syrup Take 5 mLs by mouth every 6 (six) hours as needed for cough.    [provider]    Family History Family History  Problem Relation Age of Onset  . Breast cancer Neg Hx     Social History Social History   Tobacco Use  . Smoking status: Current Every Day Smoker    Packs/day: 0.50    Types: Cigarettes  . Smokeless tobacco: Never Used  Substance Use Topics  . Alcohol use: No  . Drug use: No     Allergies   Nsaids, Toradol [ketorolac tromethamine], Tramadol, Benadryl [diphenhydramine hcl], Ceftin [cefuroxime axetil], Compazine, and Epinephrine   Review of Systems Review of Systems  Constitutional: Negative for appetite change and fatigue.  HENT: Negative for congestion, ear discharge and sinus pressure.   Eyes: Negative for discharge.  Respiratory: Negative for cough.   Cardiovascular: Negative for chest pain.  Gastrointestinal: Negative for abdominal pain and diarrhea.  Genitourinary: Negative for frequency and hematuria.  Musculoskeletal: Negative for back pain.  Skin: Negative for rash.  Neurological: Negative for seizures and headaches.  Psychiatric/Behavioral: Negative for hallucinations.     Physical Exam Updated Vital Signs BP (!) 145/89 (BP Location: Right Arm)   Pulse 94   Temp 97.8 F (36.6 C) (Oral)   Resp 18   Ht 5\' 1"  (1.549 m)   Wt 36.3 kg   SpO2 90%   BMI 15.12 kg/m   Physical Exam Vitals signs and nursing note reviewed.  Constitutional:      Appearance: She is well-developed.  HENT:     Head: Normocephalic.     Nose: Nose normal.  Eyes:     General: No scleral icterus.    Conjunctiva/sclera: Conjunctivae normal.  Neck:     Musculoskeletal: Neck supple.     Thyroid: No thyromegaly.  Cardiovascular:     Rate and Rhythm: Normal rate and  regular rhythm.     Heart sounds: No murmur. No friction rub. No gallop.   Pulmonary:     Breath sounds: No stridor. No wheezing or rales.  Chest:     Chest wall: No tenderness.  Abdominal:     General: There is no distension.     Tenderness: There is no abdominal tenderness. There is no rebound.  Musculoskeletal: Normal range of motion.  Lymphadenopathy:     Cervical: No cervical adenopathy.  Skin:    Findings: No erythema or rash.  Neurological:     Mental Status: She is oriented to person,  place, and time.     Motor: No abnormal muscle tone.     Coordination: Coordination normal.  Psychiatric:        Behavior: Behavior normal.      ED Treatments / Results  Labs (all labs ordered are listed, but only abnormal results are displayed) Labs Reviewed  COMPREHENSIVE METABOLIC PANEL - Abnormal; Notable for the following components:      Result Value   Glucose, Bld 115 (*)    All other components within normal limits  CBC WITH DIFFERENTIAL/PLATELET    EKG    Radiology Dg Chest 2 View  Result Date: 06/12/2019 CLINICAL DATA:  Syncopal episode, history COPD, smoker EXAM: CHEST - 2 VIEW COMPARISON:  04/16/2017 FINDINGS: Normal heart size, mediastinal contours, and pulmonary vascularity. Emphysematous and minimal bronchitic changes consistent with COPD. No pulmonary infiltrate, pleural effusion, or pneumothorax. Bones demineralized. IMPRESSION: COPD changes without acute infiltrate. Electronically Signed   By: Ulyses SouthwardMark  Boles M.D.   On: 06/12/2019 16:22   Ct Head Wo Contrast  Result Date: 06/12/2019 CLINICAL DATA:  Unwitnessed syncopal episode 3 days, with standing at the foot of the bed packing of bag when she fell, awoke on floor, unknown duration of loss of consciousness, some confusion the next day, slipped and fell yesterday striking head in bathroom, neck pain, facial soreness and bruising EXAM: CT HEAD WITHOUT CONTRAST CT CERVICAL SPINE WITHOUT CONTRAST TECHNIQUE: Multidetector  CT imaging of the head and cervical spine was performed following the standard protocol without intravenous contrast. Multiplanar CT image reconstructions of the cervical spine were also generated. COMPARISON:  CT head 01/08/2011 FINDINGS: CT HEAD FINDINGS Brain: Normal ventricular morphology. No midline shift or mass effect. Streak artifacts at skull base and temporal lobes. Otherwise normal appearance of brain parenchyma. No definite intracranial hemorrhage, mass lesion or evidence of acute infarction. No extra-axial fluid collections. Vascular: No hyperdense vessels Skull: Intact Sinuses/Orbits: Clear Other: N/A CT CERVICAL SPINE FINDINGS Alignment: Normal Skull base and vertebrae: Vertebral body heights maintained. Scattered disc space narrowing and small endplate spurs greatest at C5-C6. No fracture or bone destruction. Osseous mineralization normal. Skull base intact. Soft tissues and spinal canal: Prevertebral soft tissues normal thickness. Regional cervical soft tissues unremarkable Disc levels:  No additional abnormalities Upper chest: Lung apices clear Other: N/A IMPRESSION: No acute intracranial abnormalities. 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 No acute cervical spine abnormalities. Electronically Signed   By: Ulyses SouthwardMark  Boles M.D.   On: 06/12/2019 16:13   Ct Cervical Spine Wo Contrast  Result Date: 06/12/2019 CLINICAL DATA:  Unwitnessed syncopal episode 3 days, with standing at the foot of the bed packing of bag when she fell, awoke on floor, unknown duration of loss of consciousness, some confusion the next day, slipped and fell yesterday striking head in bathroom, neck pain, facial soreness and bruising EXAM: CT HEAD WITHOUT CONTRAST CT CERVICAL SPINE WITHOUT CONTRAST TECHNIQUE: Multidetector CT imaging of the head and cervical spine was performed following the standard protocol without intravenous contrast. Multiplanar CT image reconstructions of the cervical spine  were also generated. COMPARISON:  CT head 01/08/2011 FINDINGS: CT HEAD FINDINGS Brain: Normal ventricular morphology. No midline shift or mass effect. Streak artifacts at skull base and temporal lobes. Otherwise normal appearance of brain parenchyma. No definite intracranial hemorrhage, mass lesion or evidence of acute infarction. No extra-axial fluid collections. Vascular: No hyperdense vessels Skull: Intact Sinuses/Orbits: Clear Other: N/A CT CERVICAL SPINE FINDINGS Alignment: Normal Skull base and vertebrae: Vertebral body heights  maintained. Scattered disc space narrowing and small endplate spurs greatest at C5-C6. No fracture or bone destruction. Osseous mineralization normal. Skull base intact. Soft tissues and spinal canal: Prevertebral soft tissues normal thickness. Regional cervical soft tissues unremarkable Disc levels:  No additional abnormalities Upper chest: Lung apices clear Other: N/A IMPRESSION: No acute intracranial abnormalities. 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 No acute cervical spine abnormalities. Electronically Signed   By: Lavonia Dana M.D.   On: 06/12/2019 16:13    Procedures Procedures (including critical care time)  Medications Ordered in ED Medications  sodium chloride 0.9 % bolus 500 mL (0 mLs Intravenous Stopped 06/12/19 1636)  acetaminophen (TYLENOL) tablet 650 mg (650 mg Oral Given 06/12/19 1641)     Initial Impression / Assessment and Plan / ED Course  I have reviewed the triage vital signs and the nursing notes.  Pertinent labs & imaging results that were available during my care of the patient were reviewed by me and considered in my medical decision making (see chart for details).        Labs and x-rays unremarkable.  Patient slipped and fell and hit her head without significant injury.  She will follow-up with her PCP as needed  Final Clinical Impressions(s) / ED Diagnoses   Final diagnoses:  Syncope and collapse     ED Discharge Orders         Ordered    HYDROcodone-acetaminophen (NORCO/VICODIN) 5-325 MG tablet  Every 6 hours PRN     06/12/19 1704           Milton Ferguson, MD 06/12/19 1707

## 2019-07-05 ENCOUNTER — Inpatient Hospital Stay (HOSPITAL_COMMUNITY)
Admission: EM | Admit: 2019-07-05 | Discharge: 2019-07-08 | DRG: 177 | Disposition: A | Discharge status: Home / Self Care | Payer: Self-pay | Attending: Family Medicine | Admitting: Family Medicine

## 2019-07-05 ENCOUNTER — Emergency Department (HOSPITAL_COMMUNITY): Payer: Self-pay

## 2019-07-05 ENCOUNTER — Encounter (HOSPITAL_COMMUNITY): Payer: Self-pay

## 2019-07-05 ENCOUNTER — Other Ambulatory Visit: Payer: Self-pay

## 2019-07-05 DIAGNOSIS — F17210 Nicotine dependence, cigarettes, uncomplicated: Secondary | ICD-10-CM | POA: Diagnosis present

## 2019-07-05 DIAGNOSIS — K5790 Diverticulosis of intestine, part unspecified, without perforation or abscess without bleeding: Secondary | ICD-10-CM | POA: Diagnosis present

## 2019-07-05 DIAGNOSIS — F4310 Post-traumatic stress disorder, unspecified: Secondary | ICD-10-CM | POA: Diagnosis present

## 2019-07-05 DIAGNOSIS — G894 Chronic pain syndrome: Secondary | ICD-10-CM | POA: Diagnosis present

## 2019-07-05 DIAGNOSIS — D72829 Elevated white blood cell count, unspecified: Secondary | ICD-10-CM | POA: Diagnosis present

## 2019-07-05 DIAGNOSIS — R0902 Hypoxemia: Secondary | ICD-10-CM

## 2019-07-05 DIAGNOSIS — J9600 Acute respiratory failure, unspecified whether with hypoxia or hypercapnia: Secondary | ICD-10-CM | POA: Diagnosis present

## 2019-07-05 DIAGNOSIS — Z885 Allergy status to narcotic agent status: Secondary | ICD-10-CM

## 2019-07-05 DIAGNOSIS — Z7951 Long term (current) use of inhaled steroids: Secondary | ICD-10-CM

## 2019-07-05 DIAGNOSIS — J690 Pneumonitis due to inhalation of food and vomit: Principal | ICD-10-CM | POA: Diagnosis present

## 2019-07-05 DIAGNOSIS — Z79899 Other long term (current) drug therapy: Secondary | ICD-10-CM

## 2019-07-05 DIAGNOSIS — R1013 Epigastric pain: Secondary | ICD-10-CM

## 2019-07-05 DIAGNOSIS — J9601 Acute respiratory failure with hypoxia: Secondary | ICD-10-CM | POA: Diagnosis present

## 2019-07-05 DIAGNOSIS — K279 Peptic ulcer, site unspecified, unspecified as acute or chronic, without hemorrhage or perforation: Secondary | ICD-10-CM | POA: Diagnosis present

## 2019-07-05 DIAGNOSIS — Z888 Allergy status to other drugs, medicaments and biological substances status: Secondary | ICD-10-CM

## 2019-07-05 DIAGNOSIS — Z9049 Acquired absence of other specified parts of digestive tract: Secondary | ICD-10-CM

## 2019-07-05 DIAGNOSIS — J439 Emphysema, unspecified: Secondary | ICD-10-CM | POA: Diagnosis present

## 2019-07-05 DIAGNOSIS — Z1159 Encounter for screening for other viral diseases: Secondary | ICD-10-CM

## 2019-07-05 DIAGNOSIS — Z881 Allergy status to other antibiotic agents status: Secondary | ICD-10-CM

## 2019-07-05 DIAGNOSIS — K859 Acute pancreatitis without necrosis or infection, unspecified: Secondary | ICD-10-CM | POA: Diagnosis present

## 2019-07-05 DIAGNOSIS — K219 Gastro-esophageal reflux disease without esophagitis: Secondary | ICD-10-CM | POA: Diagnosis present

## 2019-07-05 DIAGNOSIS — J189 Pneumonia, unspecified organism: Secondary | ICD-10-CM | POA: Diagnosis present

## 2019-07-05 DIAGNOSIS — G92 Toxic encephalopathy: Secondary | ICD-10-CM | POA: Diagnosis not present

## 2019-07-05 LAB — BASIC METABOLIC PANEL
Anion gap: 8 (ref 5–15)
BUN: 18 mg/dL (ref 8–23)
CO2: 27 mmol/L (ref 22–32)
Calcium: 9.5 mg/dL (ref 8.9–10.3)
Chloride: 105 mmol/L (ref 98–111)
Creatinine, Ser: 0.53 mg/dL (ref 0.44–1.00)
GFR calc Af Amer: >60 mL/min (ref >60)
GFR calc non Af Amer: >60 mL/min (ref >60)
Glucose, Bld: 86 mg/dL (ref 70–99)
Potassium: 3.7 mmol/L (ref 3.5–5.1)
Sodium: 140 mmol/L (ref 135–145)

## 2019-07-05 LAB — CBC
HCT: 42.9 % (ref 36.0–46.0)
Hemoglobin: 14.2 g/dL (ref 12.0–15.0)
MCH: 31.2 pg (ref 26.0–34.0)
MCHC: 33.1 g/dL (ref 30.0–36.0)
MCV: 94.3 fL (ref 80.0–100.0)
Platelets: 211 10*3/uL (ref 150–400)
RBC: 4.55 MIL/uL (ref 3.87–5.11)
RDW: 14.4 % (ref 11.5–15.5)
WBC: 9.4 10*3/uL (ref 4.0–10.5)
nRBC: 0 % (ref 0.0–0.2)

## 2019-07-05 MED ORDER — SODIUM CHLORIDE 0.9% FLUSH
3.0000 mL | Freq: Once | INTRAVENOUS | Status: AC
  Administered 2019-07-06: 3 mL via INTRAVENOUS

## 2019-07-05 NOTE — ED Triage Notes (Signed)
Yesterday began having sharp pain upper abd pain that radiates towards right flank that is accompanied with chest pain. When pain is severe pt states SOB

## 2019-07-06 ENCOUNTER — Other Ambulatory Visit: Payer: Self-pay

## 2019-07-06 ENCOUNTER — Emergency Department (HOSPITAL_COMMUNITY): Payer: Self-pay

## 2019-07-06 ENCOUNTER — Encounter (HOSPITAL_COMMUNITY): Payer: Self-pay | Admitting: *Deleted

## 2019-07-06 DIAGNOSIS — K859 Acute pancreatitis without necrosis or infection, unspecified: Secondary | ICD-10-CM

## 2019-07-06 LAB — HEPATIC FUNCTION PANEL
ALT: 21 U/L (ref 0–44)
AST: 21 U/L (ref 15–41)
Albumin: 4 g/dL (ref 3.5–5.0)
Alkaline Phosphatase: 91 U/L (ref 38–126)
Bilirubin, Direct: <0.1 mg/dL (ref 0.0–0.2)
Total Bilirubin: 0.5 mg/dL (ref 0.3–1.2)
Total Protein: 7.2 g/dL (ref 6.5–8.1)

## 2019-07-06 LAB — CBC
HCT: 42.3 % (ref 36.0–46.0)
Hemoglobin: 13.8 g/dL (ref 12.0–15.0)
MCH: 31.4 pg (ref 26.0–34.0)
MCHC: 32.6 g/dL (ref 30.0–36.0)
MCV: 96.4 fL (ref 80.0–100.0)
Platelets: 181 10*3/uL (ref 150–400)
RBC: 4.39 MIL/uL (ref 3.87–5.11)
RDW: 14.5 % (ref 11.5–15.5)
WBC: 8.6 10*3/uL (ref 4.0–10.5)
nRBC: 0 % (ref 0.0–0.2)

## 2019-07-06 LAB — TROPONIN I (HIGH SENSITIVITY)
Troponin I (High Sensitivity): <2 ng/L (ref <18)
Troponin I (High Sensitivity): <2 ng/L (ref <18)

## 2019-07-06 LAB — URINALYSIS, ROUTINE W REFLEX MICROSCOPIC
Bilirubin Urine: NEGATIVE
Glucose, UA: NEGATIVE mg/dL
Hgb urine dipstick: NEGATIVE
Ketones, ur: NEGATIVE mg/dL
Leukocytes,Ua: NEGATIVE
Nitrite: NEGATIVE
Protein, ur: NEGATIVE mg/dL
Specific Gravity, Urine: 1.009 (ref 1.005–1.030)
pH: 7 (ref 5.0–8.0)

## 2019-07-06 LAB — LIPID PANEL
Cholesterol: 156 mg/dL (ref 0–200)
HDL: 79 mg/dL (ref >40)
LDL Cholesterol: 58 mg/dL (ref 0–99)
Total CHOL/HDL Ratio: 2 RATIO
Triglycerides: 93 mg/dL (ref <150)
VLDL: 19 mg/dL (ref 0–40)

## 2019-07-06 LAB — CREATININE, SERUM
Creatinine, Ser: 0.53 mg/dL (ref 0.44–1.00)
GFR calc Af Amer: >60 mL/min (ref >60)
GFR calc non Af Amer: >60 mL/min (ref >60)

## 2019-07-06 LAB — LIPASE, BLOOD: Lipase: 131 U/L — ABNORMAL HIGH (ref 11–51)

## 2019-07-06 LAB — SARS CORONAVIRUS 2 BY RT PCR (HOSPITAL ORDER, PERFORMED IN ~~LOC~~ HOSPITAL LAB): SARS Coronavirus 2: NEGATIVE

## 2019-07-06 MED ORDER — BACLOFEN 10 MG PO TABS
10.0000 mg | ORAL_TABLET | Freq: Three times a day (TID) | ORAL | Status: DC
  Administered 2019-07-06 (×2): 10 mg via ORAL
  Filled 2019-07-06 (×2): qty 1

## 2019-07-06 MED ORDER — HYDROMORPHONE HCL 1 MG/ML IJ SOLN
1.0000 mg | INTRAMUSCULAR | Status: DC | PRN
  Administered 2019-07-06 (×4): 1 mg via INTRAVENOUS
  Filled 2019-07-06 (×4): qty 1

## 2019-07-06 MED ORDER — PANTOPRAZOLE SODIUM 40 MG IV SOLR
40.0000 mg | Freq: Once | INTRAVENOUS | Status: AC
  Administered 2019-07-06: 40 mg via INTRAVENOUS
  Filled 2019-07-06: qty 40

## 2019-07-06 MED ORDER — GABAPENTIN 300 MG PO CAPS
300.0000 mg | ORAL_CAPSULE | Freq: Three times a day (TID) | ORAL | Status: DC
  Administered 2019-07-06 – 2019-07-08 (×7): 300 mg via ORAL
  Filled 2019-07-06 (×7): qty 1

## 2019-07-06 MED ORDER — CLONAZEPAM 0.5 MG PO TABS
1.0000 mg | ORAL_TABLET | Freq: Three times a day (TID) | ORAL | Status: DC
  Administered 2019-07-06 (×2): 1 mg via ORAL
  Filled 2019-07-06 (×2): qty 2

## 2019-07-06 MED ORDER — MOMETASONE FURO-FORMOTEROL FUM 200-5 MCG/ACT IN AERO
2.0000 | INHALATION_SPRAY | Freq: Two times a day (BID) | RESPIRATORY_TRACT | Status: DC
  Administered 2019-07-07 – 2019-07-08 (×3): 2 via RESPIRATORY_TRACT
  Filled 2019-07-06: qty 8.8

## 2019-07-06 MED ORDER — FLUTICASONE PROPIONATE 50 MCG/ACT NA SUSP
2.0000 | Freq: Every day | NASAL | Status: DC
  Administered 2019-07-06 – 2019-07-08 (×3): 2 via NASAL
  Filled 2019-07-06 (×2): qty 16

## 2019-07-06 MED ORDER — ACETAMINOPHEN 650 MG RE SUPP: 650.0000 mg | Freq: Four times a day (QID) | RECTAL | Status: DC | PRN

## 2019-07-06 MED ORDER — SODIUM CHLORIDE 0.9 % IV BOLUS
500.0000 mL | Freq: Once | INTRAVENOUS | Status: AC
  Administered 2019-07-06: 01:00:00 500 mL via INTRAVENOUS

## 2019-07-06 MED ORDER — AZELASTINE HCL 137 MCG/SPRAY NA SOLN: 1.0000 | Freq: Every day | NASAL | Status: DC | PRN

## 2019-07-06 MED ORDER — FENTANYL CITRATE (PF) 100 MCG/2ML IJ SOLN
25.0000 ug | Freq: Once | INTRAMUSCULAR | Status: AC
  Administered 2019-07-06: 25 ug via INTRAVENOUS
  Filled 2019-07-06: qty 2

## 2019-07-06 MED ORDER — SODIUM CHLORIDE (HYPERTONIC) 2 % OP SOLN: 1.0000 [drp] | Freq: Two times a day (BID) | OPHTHALMIC | Status: DC

## 2019-07-06 MED ORDER — ALBUTEROL SULFATE (2.5 MG/3ML) 0.083% IN NEBU: 3.0000 mL | INHALATION_SOLUTION | Freq: Four times a day (QID) | RESPIRATORY_TRACT | Status: DC | PRN

## 2019-07-06 MED ORDER — LACTATED RINGERS IV SOLN
INTRAVENOUS | Status: DC
  Administered 2019-07-06 – 2019-07-08 (×7): via INTRAVENOUS

## 2019-07-06 MED ORDER — DIAZEPAM 5 MG PO TABS
5.0000 mg | ORAL_TABLET | Freq: Two times a day (BID) | ORAL | Status: DC
  Administered 2019-07-06 (×2): 5 mg via ORAL
  Filled 2019-07-06 (×2): qty 1

## 2019-07-06 MED ORDER — SODIUM CHLORIDE (HYPERTONIC) 5 % OP OINT: TOPICAL_OINTMENT | Freq: Every day | OPHTHALMIC | Status: DC

## 2019-07-06 MED ORDER — FENTANYL CITRATE (PF) 100 MCG/2ML IJ SOLN
50.0000 ug | Freq: Once | INTRAMUSCULAR | Status: AC
  Administered 2019-07-06: 50 ug via INTRAVENOUS
  Filled 2019-07-06: qty 2

## 2019-07-06 MED ORDER — IOHEXOL 300 MG/ML  SOLN
75.0000 mL | Freq: Once | INTRAMUSCULAR | Status: AC | PRN
  Administered 2019-07-06: 75 mL via INTRAVENOUS

## 2019-07-06 MED ORDER — PANTOPRAZOLE SODIUM 40 MG IV SOLR
40.0000 mg | INTRAVENOUS | Status: DC
  Administered 2019-07-06 – 2019-07-07 (×2): 40 mg via INTRAVENOUS
  Filled 2019-07-06 (×2): qty 40

## 2019-07-06 MED ORDER — ONDANSETRON HCL 4 MG/2ML IJ SOLN
4.0000 mg | Freq: Once | INTRAMUSCULAR | Status: AC
  Administered 2019-07-06: 4 mg via INTRAVENOUS
  Filled 2019-07-06: qty 2

## 2019-07-06 MED ORDER — ONDANSETRON HCL 4 MG/2ML IJ SOLN
4.0000 mg | Freq: Four times a day (QID) | INTRAMUSCULAR | Status: DC | PRN
  Administered 2019-07-06: 21:00:00 4 mg via INTRAVENOUS
  Filled 2019-07-06: qty 2

## 2019-07-06 MED ORDER — ALUM & MAG HYDROXIDE-SIMETH 200-200-20 MG/5ML PO SUSP
30.0000 mL | Freq: Once | ORAL | Status: AC
  Administered 2019-07-06: 30 mL via ORAL
  Filled 2019-07-06: qty 30

## 2019-07-06 MED ORDER — SODIUM CHLORIDE 0.9 % IV BOLUS
1000.0000 mL | Freq: Once | INTRAVENOUS | Status: AC
  Administered 2019-07-06: 1000 mL via INTRAVENOUS

## 2019-07-06 MED ORDER — ONDANSETRON HCL 4 MG PO TABS: 4.0000 mg | ORAL_TABLET | Freq: Four times a day (QID) | ORAL | Status: DC | PRN

## 2019-07-06 MED ORDER — ACETAMINOPHEN 325 MG PO TABS: 650.0000 mg | ORAL_TABLET | Freq: Four times a day (QID) | ORAL | Status: DC | PRN

## 2019-07-06 MED ORDER — ENOXAPARIN SODIUM 40 MG/0.4ML ~~LOC~~ SOLN
40.0000 mg | SUBCUTANEOUS | Status: DC
  Administered 2019-07-06: 40 mg via SUBCUTANEOUS
  Filled 2019-07-06: qty 0.4

## 2019-07-06 NOTE — ED Provider Notes (Signed)
[facility] EMERGENCY DEPARTMENT Provider Note   CSN: [id number] Arrival date & time: 07/05/19  2035  Time seen 11:45 PM  History   Chief Complaint Chief Complaint  Patient presents with   Abdominal Pain    HPI [name] is a 63 y.o. female.     HPI patient states yesterday she started having pain in her lower central chest that started gradually and is getting worse.  She states it did go away last night and then returned at 630 this morning and she describes it as constant.  She describes the pain as sharp and burning.  Actually during my exam the pain is actually her epigastric area.  She states she did have some acid reflux once today.  She states she feels short of breath.  She denies cough, fever, sore throat, or rhinorrhea.  She states she had explosive diarrhea for the 2 days prior to this pain starting and she did not see any blood.  She states she had upper abdominal pain with the diarrhea.  She has had nausea and vomited once today.  She denies any change in her food or being on antibiotics.  She states any type of movement or straightening her back or shortly after eating the pain gets worse.  Nothing makes it feel better.  She states she is had this pain before but normally it would last less than 5 minutes and has been occurring less than a year.  She states she quit smoking after 45 years in January when she was admitted with a perforated appendicitis.  She has had a cholecystectomy done.  She states she has had peptic ulcer disease before.  She has tested positive for H. pylori however she has been unable to tolerate the antibiotic treatment 3 times.  She states she is never had to see a cardiologist.  PCP Smith RobertKikel, Stephen, MD   Past Medical History:  Diagnosis Date   Chronic pain    gneralized denies fibromyalgia   COPD (chronic obstructive pulmonary disease) (HCC)    Depression    Diverticulosis    IBS (irritable bowel syndrome)    Panic disorder     Panic disorder    PTSD (post-traumatic stress disorder)    PTSD (post-traumatic stress disorder) 01/08/2019   Temporomandibular joint disorder (TMJ)    age 63 fractured TMJ - repaired by oral surgeon    Patient Active Problem List   Diagnosis Date Noted   Oral candidiasis 02/02/2019   Perforated appendicitis 01/10/2019   Acute appendicitis 01/08/2019    Past Surgical History:  Procedure Laterality Date   CHOLECYSTECTOMY     EYE SURGERY     FRACTURE SURGERY     LAPAROSCOPIC APPENDECTOMY N/A 01/08/2019   Procedure: APPENDECTOMY LAPAROSCOPIC;  Surgeon: Lucretia RoersBridges, Lindsay C, MD;  Location: [facility];  Service: General;  Laterality: N/A;     OB History    Gravida  0   Para  0   Term  0   Preterm  0   AB  0   Living  0     SAB  0   TAB  0   Ectopic  0   Multiple  0   Live Births  0            Home Medications    Prior to Admission medications   Medication Sig Start Date End Date Taking? Authorizing Provider  albuterol (PROVENTIL HFA;VENTOLIN HFA) 108 (90 Base) MCG/ACT inhaler Inhale 1-2 puffs into the  lungs every 6 (six) hours as needed for wheezing or shortness of breath.    [provider]  Azelastine HCl 137 MCG/SPRAY SOLN Place 1 spray into the nose daily as needed (for nasal congestion).  10/27/18   [provider]  baclofen (LIORESAL) 10 MG tablet Take 10 mg by mouth 3 (three) times daily.  07/08/18   [provider]  budesonide-formoterol (SYMBICORT) 160-4.5 MCG/ACT inhaler Inhale 2 puffs into the lungs 2 (two) times daily.    [provider]  clonazePAM (KLONOPIN) 1 MG tablet Take 1 mg by mouth 3 (three) times daily. *May take one additional tablet as needed for anxiety/PTSD    [provider]  diazepam (VALIUM) 5 MG tablet Take 1 tablet (5 mg total) by mouth 2 (two) times daily. 02/25/19   Felicie Morn, NP  ezetimibe (ZETIA) 10 MG tablet Take 10 mg by mouth daily.    [provider]  fluticasone  (FLONASE) 50 MCG/ACT nasal spray Place 2 sprays into both nostrils daily.    [provider]  furosemide (LASIX) 20 MG tablet Take 1 tablet (20 mg total) by mouth daily. 01/22/19   [name], MD  gabapentin (NEURONTIN) 300 MG capsule Take 300 mg by mouth 3 (three) times daily.     [provider]  Gabapentin Enacarbil (HORIZANT) 600 MG TBCR Take 1 tablet by mouth at bedtime as needed (FOR PAIN/NEUROPATHY).     [provider]  HYDROcodone-acetaminophen (NORCO/VICODIN) 5-325 MG tablet Take 1 tablet by mouth every 6 (six) hours as needed. 06/12/19   Bethann Berkshire, MD  HYDROcodone-homatropine [facility]) 5-1.5 MG/5ML syrup Take 5 mLs by mouth every 6 (six) hours as needed for cough.    [provider]  ipratropium (ATROVENT) 0.03 % nasal spray Place 1 spray into both nostrils daily as needed for rhinitis.  10/12/18   [provider]  Multiple Vitamins-Minerals (CENTRUM SILVER PO) Take 1 tablet by mouth daily.    [provider]  MURO 128 2 % ophthalmic solution Place 1 drop into both eyes 2 (two) times daily. 11/10/18   [provider]  MURO 128 5 % ophthalmic ointment Place 1 drop into both eyes at bedtime. 11/10/18   [provider]    Family History Family History  Problem Relation Age of Onset   Breast cancer Neg Hx     Social History Social History   Tobacco Use   Smoking status: Current Every Day Smoker    Packs/day: 0.50    Types: Cigarettes   Smokeless tobacco: Never Used  Substance Use Topics   Alcohol use: No   Drug use: No  Retired [employer] fundraiser   Allergies   Nsaids, Toradol [ketorolac tromethamine], Tramadol, Benadryl [diphenhydramine hcl], Ceftin [cefuroxime axetil], Compazine, and Epinephrine   Review of Systems Review of Systems  All other systems reviewed and are negative.    Physical Exam Updated Vital Signs BP 115/72    Pulse (!) 55    Temp 98.2 F (36.8 C) (Oral)    Resp 16    Ht   (1.549 m)    Wt 36.3 kg    SpO2 95%    BMI 15.12 kg/m   Vital signs normal except for bradycardia   Physical Exam Vitals signs and nursing note reviewed.  Constitutional:      General: She is not in acute distress.    Appearance: Normal appearance. She is well-developed. She is not ill-appearing or toxic-appearing.     Comments:  Underweight, appears uncomfortable  HENT:     Head: Normocephalic and atraumatic.     Right Ear: External ear normal.     Left Ear: External ear normal.     Nose: Nose normal. No mucosal edema or rhinorrhea.     Mouth/Throat:     Mouth: Mucous membranes are dry.     Dentition: No dental abscesses.     Pharynx: No uvula swelling.  Eyes:     Conjunctiva/sclera: Conjunctivae normal.     Pupils: Pupils are equal, round, and reactive to light.     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  Cardiovascular:     Rate and Rhythm: Normal rate and regular rhythm.     Heart sounds: Normal heart sounds. No murmur. No friction rub. No gallop.   Pulmonary:     Effort: Pulmonary effort is normal. No respiratory distress.     Breath sounds: Normal breath sounds. No wheezing, rhonchi or rales.  Chest:     Chest wall: No tenderness or crepitus.  Abdominal:     General: Bowel sounds are normal. There is no distension.     Palpations: Abdomen is soft.     Tenderness: There is abdominal tenderness in the epigastric area and left lower quadrant. There is no guarding or rebound.       Comments: On exam her chest pain is actually in her epigastric area and is very tender to palpation.  She is also tender in the left lower quadrant.  Musculoskeletal: Normal range of motion.        General: No tenderness.     Comments: Moves all extremities well.   Skin:    General: Skin is warm and dry.       Coloration: Skin is not pale.     Findings: No erythema or rash.  Neurological:     General: No focal deficit present.     Mental Status: She is alert and oriented to person, place, and time.     Cranial Nerves: No cranial nerve deficit.  Psychiatric:        Mood and Affect: Mood normal. Mood is not anxious.        Speech: Speech normal.        Behavior: Behavior normal.        Thought Content: Thought content normal.      ED Treatments / Results  Labs (all labs ordered are listed, but only abnormal results are displayed) Results for orders placed or performed during the hospital encounter of 07/05/19  Basic metabolic panel  Result Value Ref Range   Sodium 140 135 - 145 mmol/L   Potassium 3.7 3.5 - 5.1 mmol/L   Chloride 105 98 - 111 mmol/L   CO2 27 22 - 32 mmol/L   Glucose, Bld 86 70 - 99 mg/dL   BUN 18 8 - 23 mg/dL   Creatinine, Ser 1.610.53 0.44 - 1.00 mg/dL   Calcium 9.5 8.9 - 09.610.3 mg/dL   GFR calc non Af Amer >60 >60 mL/min   GFR calc Af Amer >60 >60 mL/min   Anion gap 8 5 - 15  CBC  Result Value Ref Range   WBC 9.4 4.0 - 10.5 K/uL   RBC 4.55 3.87 - 5.11 MIL/uL   Hemoglobin 14.2 12.0 -  15.0 g/dL   HCT 42.9 36.0 - 46.0 %   MCV 94.3 80.0 - 100.0 fL   MCH 31.2 26.0 - 34.0 pg   MCHC 33.1 30.0 - 36.0 g/dL   RDW 14.4 11.5 - 15.5 %   Platelets 211 150 - 400 K/uL   nRBC 0.0 0.0 - 0.2 %  Lipase, blood  Result Value Ref Range   Lipase 131 (H) 11 - 51 U/L  Hepatic function panel  Result Value Ref Range   Total Protein 7.2 6.5 - 8.1 g/dL   Albumin 4.0 3.5 - 5.0 g/dL   AST 21 15 - 41 U/L   ALT 21 0 - 44 U/L   Alkaline Phosphatase 91 38 - 126 U/L   Total Bilirubin 0.5 0.3 - 1.2 mg/dL   Bilirubin, Direct <0.1 0.0 - 0.2 mg/dL   Indirect Bilirubin NOT CALCULATED 0.3 - 0.9 mg/dL  Urinalysis, Routine w reflex microscopic  Result Value Ref Range   Color, Urine STRAW (A) YELLOW   APPearance CLEAR CLEAR   Specific Gravity, Urine 1.009 1.005 - 1.030   pH 7.0 5.0 - 8.0    Glucose, UA NEGATIVE NEGATIVE mg/dL   Hgb urine dipstick NEGATIVE NEGATIVE   Bilirubin Urine NEGATIVE NEGATIVE   Ketones, ur NEGATIVE NEGATIVE mg/dL   Protein, ur NEGATIVE NEGATIVE mg/dL   Nitrite NEGATIVE NEGATIVE   Leukocytes,Ua NEGATIVE NEGATIVE  Troponin I (High Sensitivity)  Result Value Ref Range   Troponin I (High Sensitivity) <2.0 <18 ng/L  Troponin I (High Sensitivity)  Result Value Ref Range   Troponin I (High Sensitivity) <2.0 <18 ng/L   Laboratory interpretation all normal except borderline elevated lipase but still not 3 times normal but elevated and she is usually normal.     EKG None  Radiology Dg Chest 2 View  Result Date: 07/05/2019 CLINICAL DATA:  Upper abdominal pain radiating to the right flank with chest pain. Shortness of breath. EXAM: CHEST - 2 VIEW COMPARISON:  06/12/2019 FINDINGS: Heart size is normal. Mild aortic atherosclerosis. Pulmonary hyperinflation and scarring consistent with emphysema. No sign of active infiltrate, mass, effusion or collapse. No acute bone finding. IMPRESSION: Emphysema.  No active disease. Electronically Signed   By: Nelson Chimes M.D.   On: 07/05/2019 21:26   Ct Abdomen Pelvis W Contrast  Result Date: 07/06/2019 CLINICAL DATA:  Epigastric abdominal pain EXAM: CT ABDOMEN AND PELVIS WITH CONTRAST TECHNIQUE: Multidetector CT imaging of the abdomen and pelvis was performed using the standard protocol following bolus administration of intravenous contrast. CONTRAST:  62mL OMNIPAQUE IOHEXOL 300 MG/ML  SOLN COMPARISON:  01/13/2019 FINDINGS: LOWER CHEST: There is no basilar pleural or apical pericardial effusion. HEPATOBILIARY: The hepatic contours and density are normal. There is no intra- or extrahepatic biliary dilatation. Status post cholecystectomy. PANCREAS: The pancreatic parenchymal contours are normal and there is no ductal dilatation. There is no peripancreatic fluid collection. SPLEEN: Normal. ADRENALS/URINARY TRACT: --Adrenal  glands: Normal. --Right kidney/ureter: No hydronephrosis, nephroureterolithiasis, perinephric stranding or solid renal mass. --Left kidney/ureter: No hydronephrosis, nephroureterolithiasis, perinephric stranding or solid renal mass. --Urinary bladder: Normal for degree of distention STOMACH/BOWEL: --Stomach/Duodenum: There is no hiatal hernia or other gastric abnormality. The duodenal course and caliber are normal. --Small bowel: Multiple areas of small bowel narrowing but no dilatation or inflammation. --Colon: No takes focal abnormality. --Appendix: Surgically absent. VASCULAR/LYMPHATIC: Normal course and caliber of the major abdominal vessels. No abdominal or pelvic lymphadenopathy. REPRODUCTIVE: No free fluid in the pelvis. MUSCULOSKELETAL. No bony spinal canal stenosis or  focal osseous abnormality. OTHER: None. IMPRESSION: No acute abdominal or pelvic abnormality. There are multiple areas of small bowel narrowing, but no dilatation or inflammation. Electronically Signed   By: Deatra RobinsonKevin  Herman M.D.   On: 07/06/2019 03:24    Procedures Procedures (including critical care time)  Medications Ordered in ED Medications  sodium chloride flush (NS) 0.9 % injection 3 mL (3 mLs Intravenous Given 07/06/19 0033)  sodium chloride 0.9 % bolus 1,000 mL (0 mLs Intravenous Stopped 07/06/19 0402)  sodium chloride 0.9 % bolus 500 mL (0 mLs Intravenous Stopped 07/06/19 0402)  pantoprazole (PROTONIX) injection 40 mg (40 mg Intravenous Given 07/06/19 0032)  ondansetron (ZOFRAN) injection 4 mg (4 mg Intravenous Given 07/06/19 0032)  fentaNYL (SUBLIMAZE) injection 25 mcg (25 mcg Intravenous Given 07/06/19 0032)  iohexol (OMNIPAQUE) 300 MG/ML solution 75 mL (75 mLs Intravenous Contrast Given 07/06/19 0236)  alum & mag hydroxide-simeth (MAALOX/MYLANTA) 200-200-20 MG/5ML suspension 30 mL (30 mLs Oral Given 07/06/19 0401)  fentaNYL (SUBLIMAZE) injection 50 mcg (50 mcg Intravenous Given 07/06/19 0401)     Initial Impression /  Assessment and Plan / ED Course  I have reviewed the triage vital signs and the nursing notes.  Pertinent labs & imaging results that were available during my care of the patient were reviewed by me and considered in my medical decision making (see chart for details).   At the time of my exam her blood pressure was reading 77 systolic.  She was given IV fluids, IV Protonix for suspected peptic ulcer disease.  She also was given low-dose fentanyl and Zofran.  Due to her her prior abdominal problems CT of the abdomen was ordered.   Recheck at 4 AM patient's has still been requesting more pain medication.  She was given an additional dose of fentanyl and also given Maalox orally.  Her symptoms were consistent with gastritis or GERD.  However she did have gallbladder surgery in the past.  She may need to stay in the ED and get a ultrasound in the morning to check for retained stone.  CT does not give any evidence of that but is not the test of choice to check for that.  Recheck 06:15 AM patient states if she lays on her right side her pain feels better, if she lays on her left side it makes her have pain in her lower abdomen.  She states her cholecystectomy was in 2009.  We discussed getting an ultrasound to make sure she does not have a retained stone.  We will then talk to the hospitalist to see if she needs to be admitted.  US RUQ pending  7:30 AM Dr. Rubin PayorPickering will check her ultrasound results and decide her disposition.   Final Clinical Impressions(s) / ED Diagnoses   Final diagnoses:  Epigastric abdominal pain    Disposition pending  Devoria AlbeIva Lindwood Mogel, MD, Concha PyoFACEP    Jamarria Real, MD 07/06/19 [phone]

## 2019-07-06 NOTE — H&P (Addendum)
History and Physical    [name] LPF:[id number] DOB: 1956/12/04 DOA: 07/05/2019  PCP: Vidal Schwalbe, MD   Patient coming from: Home  Chief Complaint: Abdominal pain/N/V  HPI: [name] is a 63 y.o. female with medical history significant for chronic pain, COPD/emphysema, depression, IBS, peptic ulcer disease, prior cholecystectomy and appendectomy, and prior tobacco abuse who presented to the emergency department with epigastric abdominal pain that began last night which was noted to be initially intermittent and then constant in nature.  She describes it as a sharp and burning sensation without any significant radiation of the pain.  She states that she had diarrhea for 2 days prior to this symptom starting.  She denies any change in her foods or recent antibiotics.  She has some alleviation of pain with positional changes and that eating seems to make her pain worse.  She has had similar pains previously which have spontaneously resolved in several minutes and this is been ongoing for the last year.  She has had prior perforated appendicitis and underwent urgent laparoscopic appendectomy earlier this year and has also had cholecystectomy.  She is also noted to have peptic ulcer disease.  She denies any significant chest pain, shortness of breath, fever, cough, or rhinorrhea.  COVID testing has been ordered and currently pending.  Patient denies any current alcohol or tobacco use.   ED Course: Vital signs noted to be stable patient was afebrile.  Laboratory data with no acute findings or LFT elevation aside from lipase noted to be elevated at 131.  Ultrasound the abdomen with trace fluid around prior cholecystectomy site with no common bile duct dilation noted.  CT of the abdomen with no acute findings.  Two-view chest x-ray with emphysema no acute findings.  Urine analysis with no findings of UTI.  EKG with normal sinus rhythm at 85 bpm and troponin negative x2 sets.  She has been given 2  L of IV fluid as well as some fentanyl for pain management and continues to complain of ongoing pain.  Review of Systems: All others reviewed as noted above and otherwise negative.  Past Medical History:  Diagnosis Date   Chronic pain    gneralized denies fibromyalgia   COPD (chronic obstructive pulmonary disease) (HCC)    Depression    Diverticulosis    IBS (irritable bowel syndrome)    Panic disorder    Panic disorder    PTSD (post-traumatic stress disorder)    PTSD (post-traumatic stress disorder) 01/08/2019   Temporomandibular joint disorder (TMJ)    age 89 fractured TMJ - repaired by oral surgeon    Past Surgical History:  Procedure Laterality Date   CHOLECYSTECTOMY     EYE SURGERY     FRACTURE SURGERY     LAPAROSCOPIC APPENDECTOMY N/A 01/08/2019   Procedure: APPENDECTOMY LAPAROSCOPIC;  Surgeon: Virl Cagey, MD;  Location: [facility];  Service: General;  Laterality: N/A;     reports that she has been smoking cigarettes. She has been smoking about 0.50 packs per day. She has never used smokeless tobacco. She reports that she does not drink alcohol or use drugs.  Allergies  Allergen Reactions   Nsaids     Advised not to take    Toradol [Ketorolac Tromethamine] Nausea And Vomiting and Swelling    TONGUE SWELLING   Tramadol Nausea And Vomiting   Benadryl [Diphenhydramine Hcl] Palpitations    Insomnia   Ceftin [Cefuroxime Axetil] Rash   Compazine Palpitations    Insomnia  Epinephrine Palpitations    Insomina    Family History  Problem Relation Age of Onset   Breast cancer Neg Hx     Prior to Admission medications   Medication Sig Start Date End Date Taking? Authorizing Provider  albuterol (PROVENTIL HFA;VENTOLIN HFA) 108 (90 Base) MCG/ACT inhaler Inhale 1-2 puffs into the lungs every 6 (six) hours as needed for wheezing or shortness of breath.    [provider]  Azelastine HCl 137 MCG/SPRAY SOLN Place 1 spray into the nose  daily as needed (for nasal congestion).  10/27/18   [provider]  baclofen (LIORESAL) 10 MG tablet Take 10 mg by mouth 3 (three) times daily.  07/08/18   [provider]  budesonide-formoterol (SYMBICORT) 160-4.5 MCG/ACT inhaler Inhale 2 puffs into the lungs 2 (two) times daily.    [provider]  clonazePAM (KLONOPIN) 1 MG tablet Take 1 mg by mouth 3 (three) times daily. *May take one additional tablet as needed for anxiety/PTSD    [provider]  diazepam (VALIUM) 5 MG tablet Take 1 tablet (5 mg total) by mouth 2 (two) times daily. 02/25/19   Felicie MornSmith, David, NP  ezetimibe (ZETIA) 10 MG tablet Take 10 mg by mouth daily.    [provider]  fluticasone (FLONASE) 50 MCG/ACT nasal spray Place 2 sprays into both nostrils daily.    [provider]  furosemide (LASIX) 20 MG tablet Take 1 tablet (20 mg total) by mouth daily. 01/22/19   Lucretia RoersBridges, Lindsay C, MD  gabapentin (NEURONTIN) 300 MG capsule Take 300 mg by mouth 3 (three) times daily.     [provider]  Gabapentin Enacarbil (HORIZANT) 600 MG TBCR Take 1 tablet by mouth at bedtime as needed (FOR PAIN/NEUROPATHY).     [provider]  HYDROcodone-acetaminophen (NORCO/VICODIN) 5-325 MG tablet Take 1 tablet by mouth every 6 (six) hours as needed. 06/12/19   Bethann BerkshireZammit, Joseph, MD  HYDROcodone-homatropine [facility](HYCODAN) 5-1.5 MG/5ML syrup Take 5 mLs by mouth every 6 (six) hours as needed for cough.    [provider]  ipratropium (ATROVENT) 0.03 % nasal spray Place 1 spray into both nostrils daily as needed for rhinitis.  10/12/18   [provider]  Multiple Vitamins-Minerals (CENTRUM SILVER PO) Take 1 tablet by mouth daily.    [provider]  MURO 128 2 % ophthalmic solution Place 1 drop into both eyes 2 (two) times daily. 11/10/18   [provider]  MURO 128 5 % ophthalmic ointment Place 1 drop into both eyes at bedtime. 11/10/18   [provider]      Physical Exam: Vitals:   07/06/19 1100 07/06/19 1130 07/06/19 1200 07/06/19 1215  BP: 101/60 117/63 105/64   Pulse: (!) 41 61  (!) 44  Resp: 12 11 14 11   Temp:      TempSrc:      SpO2: 92% 90%  95%  Weight:      Height:        Constitutional: Mild distress with ongoing pain noted Vitals:   07/06/19 1100 07/06/19 1130 07/06/19 1200 07/06/19 1215  BP: 101/60 117/63 105/64   Pulse: (!) 41 61  (!) 44  Resp: 12 11 14 11   Temp:      TempSrc:      SpO2: 92% 90%  95%  Weight:      Height:       Eyes: lids and conjunctivae normal ENMT: Mucous membranes are moist.  Neck: normal, supple Respiratory: clear to  auscultation bilaterally. Normal respiratory effort. No accessory muscle use.  Cardiovascular: Regular rate and rhythm, no murmurs. No extremity edema. Abdomen: Tenderness to palpation over upper abdomen, no distention. Bowel sounds positive.  Musculoskeletal:  No joint deformity upper and lower extremities.   Skin: no rashes, lesions, ulcers.  Psychiatric: Difficult to assess.  Labs on Admission: I have personally reviewed following labs and imaging studies  CBC: Recent Labs  Lab 07/05/19 2245  WBC 9.4  HGB 14.2  HCT 42.9  MCV 94.3  PLT 211   Basic Metabolic Panel: Recent Labs  Lab 07/05/19 2245  NA 140  K 3.7  CL 105  CO2 27  GLUCOSE 86  BUN 18  CREATININE 0.53  CALCIUM 9.5   GFR: Estimated Creatinine Clearance: 41.8 mL/min (by C-G formula based on SCr of 0.53 mg/dL). Liver Function Tests: Recent Labs  Lab 07/05/19 2245  AST 21  ALT 21  ALKPHOS 91  BILITOT 0.5  PROT 7.2  ALBUMIN 4.0   Recent Labs  Lab 07/05/19 2245  LIPASE 131*   No results for input(s): AMMONIA in the last 168 hours. Coagulation Profile: No results for input(s): INR, PROTIME in the last 168 hours. Cardiac Enzymes: No results for input(s): CKTOTAL, CKMB, CKMBINDEX, TROPONINI in the last 168 hours. BNP (last 3 results) No results for input(s): PROBNP in the last 8760  hours. HbA1C: No results for input(s): HGBA1C in the last 72 hours. CBG: No results for input(s): GLUCAP in the last 168 hours. Lipid Profile: No results for input(s): CHOL, HDL, LDLCALC, TRIG, CHOLHDL, LDLDIRECT in the last 72 hours. Thyroid Function Tests: No results for input(s): TSH, T4TOTAL, FREET4, T3FREE, THYROIDAB in the last 72 hours. Anemia Panel: No results for input(s): VITAMINB12, FOLATE, FERRITIN, TIBC, IRON, RETICCTPCT in the last 72 hours. Urine analysis:    Component Value Date/Time   COLORURINE STRAW (A) 07/06/2019 0408   APPEARANCEUR CLEAR 07/06/2019 0408   LABSPEC 1.009 07/06/2019 0408   PHURINE 7.0 07/06/2019 0408   GLUCOSEU NEGATIVE 07/06/2019 0408   HGBUR NEGATIVE 07/06/2019 0408   BILIRUBINUR NEGATIVE 07/06/2019 0408   KETONESUR NEGATIVE 07/06/2019 0408   PROTEINUR NEGATIVE 07/06/2019 0408   UROBILINOGEN 0.2 10/25/2008 1340   NITRITE NEGATIVE 07/06/2019 0408   LEUKOCYTESUR NEGATIVE 07/06/2019 0408    Radiological Exams on Admission: Dg Chest 2 View  Result Date: 07/05/2019 CLINICAL DATA:  Upper abdominal pain radiating to the right flank with chest pain. Shortness of breath. EXAM: CHEST - 2 VIEW COMPARISON:  06/12/2019 FINDINGS: Heart size is normal. Mild aortic atherosclerosis. Pulmonary hyperinflation and scarring consistent with emphysema. No sign of active infiltrate, mass, effusion or collapse. No acute bone finding. IMPRESSION: Emphysema.  No active disease. Electronically Signed   By: Paulina FusiMark  Shogry M.D.   On: 07/05/2019 21:26   Ct Abdomen Pelvis W Contrast  Result Date: 07/06/2019 CLINICAL DATA:  Epigastric abdominal pain EXAM: CT ABDOMEN AND PELVIS WITH CONTRAST TECHNIQUE: Multidetector CT imaging of the abdomen and pelvis was performed using the standard protocol following bolus administration of intravenous contrast. CONTRAST:  75mL OMNIPAQUE IOHEXOL 300 MG/ML  SOLN COMPARISON:  01/13/2019 FINDINGS: LOWER CHEST: There is no basilar pleural or apical  pericardial effusion. HEPATOBILIARY: The hepatic contours and density are normal. There is no intra- or extrahepatic biliary dilatation. Status post cholecystectomy. PANCREAS: The pancreatic parenchymal contours are normal and there is no ductal dilatation. There is no peripancreatic fluid collection. SPLEEN: Normal. ADRENALS/URINARY TRACT: --Adrenal glands: Normal. --Right kidney/ureter: No hydronephrosis, nephroureterolithiasis, perinephric stranding or  solid renal mass. --Left kidney/ureter: No hydronephrosis, nephroureterolithiasis, perinephric stranding or solid renal mass. --Urinary bladder: Normal for degree of distention STOMACH/BOWEL: --Stomach/Duodenum: There is no hiatal hernia or other gastric abnormality. The duodenal course and caliber are normal. --Small bowel: Multiple areas of small bowel narrowing but no dilatation or inflammation. --Colon: No takes focal abnormality. --Appendix: Surgically absent. VASCULAR/LYMPHATIC: Normal course and caliber of the major abdominal vessels. No abdominal or pelvic lymphadenopathy. REPRODUCTIVE: No free fluid in the pelvis. MUSCULOSKELETAL. No bony spinal canal stenosis or focal osseous abnormality. OTHER: None. IMPRESSION: No acute abdominal or pelvic abnormality. There are multiple areas of small bowel narrowing, but no dilatation or inflammation. Electronically Signed   By: Deatra RobinsonKevin  Herman M.D.   On: 07/06/2019 03:24   [place] Abdomen Limited Ruq  Result Date: 07/06/2019 CLINICAL DATA:  Epigastric abdominal pain. Previous cholecystectomy. Status post laparoscopic appendectomy on 01/08/2019. EXAM: ULTRASOUND ABDOMEN LIMITED RIGHT UPPER QUADRANT COMPARISON:  Abdomen and pelvis CT obtained earlier today. FINDINGS: Gallbladder: Surgically absent. Common bile duct: Diameter: 3.8 mm Liver: No focal lesion identified. Within normal limits in parenchymal echogenicity. Tiny amount of fluid adjacent to the dome of the liver on the right. Portal vein is patent on color Doppler  imaging with normal direction of blood flow towards the liver. IMPRESSION: 1. Tiny amount of fluid adjacent to the dome of the liver on the right. This has an appearance suggesting minimal loculated ascites or a very small residual fluid collection from previous surgery. 2. Status post cholecystectomy. 3. No biliary dilatation. Electronically Signed   By: Beckie SaltsSteven  Reid M.D.   On: 07/06/2019 09:32    EKG: Independently reviewed.  Normal sinus rhythm at 85 bpm.  Assessment/Plan Active Problems:   Acute pancreatitis    Intractable nausea and vomiting possibly secondary to mild acute pancreatitis -Prior cholecystectomy with no gallstones and no current alcohol use noted -No new medications noted -We will check lipid panel -Maintain on aggressive IV fluid with LR and keep n.p.o. except medications -IV Dilaudid ordered every 2 hours as needed for severe pain -Recheck CMP and lipase in a.m. -Zofran for nausea and vomiting -Protonix IV daily  COPD/emphysema -Maintain on Symbicort with breathing treatments as needed for bronchospasms -No current bronchospasms noted  Chronic pain -Continue nonnarcotic home medications for now and maintain on IV Dilaudid as ordered  Peptic ulcer disease -Maintain on IV Protonix -Consider GI evaluation with possible endoscopy should symptoms not abate   DVT prophylaxis: Lovenox Code Status: Full Family Communication: None at bedside Disposition Plan:Admit for pancreatitis eval and treatment Consults called:None Admission status: Obs, MedSurg   Maxim Bedel Hoover BrunetteD Baylei Siebels DO [facility] Pager [phone]  If 7PM-7AM, please contact night-coverage [email or web address] Password [email or web address]  07/06/2019, 12:54 PM

## 2019-07-06 NOTE — ED Provider Notes (Signed)
  Physical Exam  BP 107/63   Pulse 64   Temp 98.2 F (36.8 C) (Oral)   Resp 15   Ht 5\' 1"  (1.549 m)   Wt 36.3 kg   SpO2 93%   BMI 15.12 kg/m   Physical Exam  ED Course/Procedures     Procedures  MDM  Patient with abdominal pain.  Epigastric.  Lipase is elevated.  However CT scan did not show any pancreatitis.  However pain still limiting her ability for oral intake.  States even drinking contrast for the scan hurt her.  Required repeat dosing of pain medicines.  I think with the inability to tolerate orals patient benefit from [place] the hospital.  Will discuss with hospitalist.       Davonna Belling, MD 07/06/19 1039

## 2019-07-07 ENCOUNTER — Observation Stay (HOSPITAL_COMMUNITY): Payer: Self-pay

## 2019-07-07 DIAGNOSIS — J189 Pneumonia, unspecified organism: Secondary | ICD-10-CM | POA: Diagnosis present

## 2019-07-07 DIAGNOSIS — D72829 Elevated white blood cell count, unspecified: Secondary | ICD-10-CM | POA: Diagnosis present

## 2019-07-07 DIAGNOSIS — J9600 Acute respiratory failure, unspecified whether with hypoxia or hypercapnia: Secondary | ICD-10-CM | POA: Diagnosis present

## 2019-07-07 DIAGNOSIS — J439 Emphysema, unspecified: Secondary | ICD-10-CM | POA: Diagnosis present

## 2019-07-07 LAB — COMPREHENSIVE METABOLIC PANEL
ALT: 172 U/L — ABNORMAL HIGH (ref 0–44)
AST: 171 U/L — ABNORMAL HIGH (ref 15–41)
Albumin: 3.6 g/dL (ref 3.5–5.0)
Alkaline Phosphatase: 132 U/L — ABNORMAL HIGH (ref 38–126)
Anion gap: 9 (ref 5–15)
BUN: 16 mg/dL (ref 8–23)
CO2: 28 mmol/L (ref 22–32)
Calcium: 8.8 mg/dL — ABNORMAL LOW (ref 8.9–10.3)
Chloride: 104 mmol/L (ref 98–111)
Creatinine, Ser: 0.53 mg/dL (ref 0.44–1.00)
GFR calc Af Amer: >60 mL/min (ref >60)
GFR calc non Af Amer: >60 mL/min (ref >60)
Glucose, Bld: 150 mg/dL — ABNORMAL HIGH (ref 70–99)
Potassium: 3.8 mmol/L (ref 3.5–5.1)
Sodium: 141 mmol/L (ref 135–145)
Total Bilirubin: 0.7 mg/dL (ref 0.3–1.2)
Total Protein: 6.6 g/dL (ref 6.5–8.1)

## 2019-07-07 LAB — CBC
HCT: 45 % (ref 36.0–46.0)
Hemoglobin: 13.6 g/dL (ref 12.0–15.0)
MCH: 31 pg (ref 26.0–34.0)
MCHC: 30.2 g/dL (ref 30.0–36.0)
MCV: 102.5 fL — ABNORMAL HIGH (ref 80.0–100.0)
Platelets: 250 10*3/uL (ref 150–400)
RBC: 4.39 MIL/uL (ref 3.87–5.11)
RDW: 14.7 % (ref 11.5–15.5)
WBC: 27.7 10*3/uL — ABNORMAL HIGH (ref 4.0–10.5)
nRBC: 0 % (ref 0.0–0.2)

## 2019-07-07 LAB — LIPASE, BLOOD: Lipase: 26 U/L (ref 11–51)

## 2019-07-07 MED ORDER — BACLOFEN 10 MG PO TABS
10.0000 mg | ORAL_TABLET | Freq: Two times a day (BID) | ORAL | Status: DC
  Administered 2019-07-07 – 2019-07-08 (×3): 10 mg via ORAL
  Filled 2019-07-07 (×3): qty 1

## 2019-07-07 MED ORDER — SODIUM CHLORIDE 0.9 % IV SOLN
3.0000 g | Freq: Three times a day (TID) | INTRAVENOUS | Status: DC
  Administered 2019-07-07 – 2019-07-08 (×3): 3 g via INTRAVENOUS
  Filled 2019-07-07 (×3): qty 8
  Filled 2019-07-07: qty 3
  Filled 2019-07-07: qty 8
  Filled 2019-07-07: qty 3

## 2019-07-07 MED ORDER — GUAIFENESIN ER 600 MG PO TB12
600.0000 mg | ORAL_TABLET | Freq: Two times a day (BID) | ORAL | Status: DC
  Administered 2019-07-07 – 2019-07-08 (×3): 600 mg via ORAL
  Filled 2019-07-07 (×3): qty 1

## 2019-07-07 MED ORDER — DIAZEPAM 2 MG PO TABS
2.0000 mg | ORAL_TABLET | Freq: Two times a day (BID) | ORAL | Status: DC
  Filled 2019-07-07: qty 1

## 2019-07-07 MED ORDER — ALBUTEROL SULFATE (2.5 MG/3ML) 0.083% IN NEBU: 3.0000 mL | INHALATION_SOLUTION | RESPIRATORY_TRACT | Status: DC | PRN

## 2019-07-07 MED ORDER — OXYCODONE HCL 5 MG PO TABS
5.0000 mg | ORAL_TABLET | ORAL | Status: DC | PRN
  Administered 2019-07-07 – 2019-07-08 (×8): 5 mg via ORAL
  Filled 2019-07-07 (×8): qty 1

## 2019-07-07 MED ORDER — HYDROMORPHONE HCL 1 MG/ML IJ SOLN: 1.0000 mg | INTRAMUSCULAR | Status: DC | PRN

## 2019-07-07 MED ORDER — IPRATROPIUM-ALBUTEROL 0.5-2.5 (3) MG/3ML IN SOLN
3.0000 mL | Freq: Three times a day (TID) | RESPIRATORY_TRACT | Status: DC
  Administered 2019-07-07 – 2019-07-08 (×4): 3 mL via RESPIRATORY_TRACT
  Filled 2019-07-07 (×5): qty 3

## 2019-07-07 MED ORDER — PANTOPRAZOLE SODIUM 40 MG PO TBEC
40.0000 mg | DELAYED_RELEASE_TABLET | Freq: Every day | ORAL | Status: DC
  Administered 2019-07-08: 40 mg via ORAL
  Filled 2019-07-07: qty 1

## 2019-07-07 MED ORDER — ENOXAPARIN SODIUM 30 MG/0.3ML ~~LOC~~ SOLN
30.0000 mg | SUBCUTANEOUS | Status: DC
  Administered 2019-07-07 – 2019-07-08 (×2): 30 mg via SUBCUTANEOUS
  Filled 2019-07-07 (×2): qty 0.3

## 2019-07-07 MED ORDER — CLONAZEPAM 0.5 MG PO TABS
0.5000 mg | ORAL_TABLET | Freq: Three times a day (TID) | ORAL | Status: DC
  Administered 2019-07-07 – 2019-07-08 (×5): 0.5 mg via ORAL
  Filled 2019-07-07 (×5): qty 1

## 2019-07-07 NOTE — Progress Notes (Signed)
Pharmacy Antibiotic Note  [name] is a 63 y.o. female admitted on 07/05/2019 with aspiration pneumonia.  Pharmacy has been consulted for Unasyn dosing.  Plan: Unasyn 3000 mg IV every 8 hours.  Monitor labs, c/s, and patient improvement.  Height: 5\' 1"  (154.9 cm) Weight: 80 lb (36.3 kg) IBW/kg (Calculated) : 47.8  Temp (24hrs), Avg:97.8 F (36.6 C), Min:97.6 F (36.4 C), Max:98.1 F (36.7 C)  Recent Labs  Lab 07/05/19 2245 07/06/19 1405 07/07/19 0558  WBC 9.4 8.6 27.7*  CREATININE 0.53 0.53 0.53    Estimated Creatinine Clearance: 41.8 mL/min (by C-G formula based on SCr of 0.53 mg/dL).    Allergies  Allergen Reactions  . Nsaids     Advised not to take   . Toradol [Ketorolac Tromethamine] Nausea And Vomiting and Swelling    TONGUE SWELLING  . Tramadol Nausea And Vomiting  . Benadryl [Diphenhydramine Hcl] Palpitations    Insomnia  . Ceftin [Cefuroxime Axetil] Rash  . Compazine Palpitations    Insomnia  . Epinephrine Palpitations    Insomina    Antimicrobials this admission: Unasyn 7/22 >>     Dose adjustments this admission: N/A  Microbiology results: 7/21 BCx: pending   Thank you for allowing pharmacy to be a part of this patient's care.  Ramond Craver 07/07/2019 2:19 PM

## 2019-07-07 NOTE — Plan of Care (Signed)
  Problem: Education: Goal: Knowledge of General Education information will improve Description: Including pain rating scale, medication(s)/side effects and non-pharmacologic comfort measures Outcome: Progressing   Problem: Health Behavior/Discharge Planning: Goal: Ability to manage health-related needs will improve Outcome: Progressing   Problem: Clinical Measurements: Goal: Ability to maintain clinical measurements within normal limits will improve Outcome: Progressing Goal: Will remain free from infection Outcome: Progressing Goal: Diagnostic test results will improve Outcome: Progressing Goal: Respiratory complications will improve Outcome: Progressing Goal: Cardiovascular complication will be avoided Outcome: Progressing   Problem: Activity: Goal: Risk for activity intolerance will decrease Outcome: Progressing   Problem: Nutrition: Goal: Adequate nutrition will be maintained Outcome: Progressing   Problem: Coping: Goal: Level of anxiety will decrease Outcome: Progressing   Problem: Elimination: Goal: Will not experience complications related to bowel motility Outcome: Progressing Goal: Will not experience complications related to urinary retention Outcome: Progressing   Problem: Pain Managment: Goal: General experience of comfort will improve Outcome: Progressing   Problem: Safety: Goal: Ability to remain free from injury will improve Outcome: Progressing   Problem: Skin Integrity: Goal: Risk for impaired skin integrity will decrease Outcome: Progressing   Problem: Education: Goal: Knowledge of Pancreatitis treatment and prevention will improve Outcome: Progressing   Problem: Health Behavior/Discharge Planning: Goal: Ability to formulate a plan to maintain an alcohol-free life will improve Outcome: Progressing   Problem: Nutritional: Goal: Ability to achieve adequate nutritional intake will improve Outcome: Progressing   Problem: Clinical  Measurements: Goal: Complications related to the disease process, condition or treatment will be avoided or minimized Outcome: Progressing   Problem: Education: Goal: Knowledge of General Education information will improve Description: Including pain rating scale, medication(s)/side effects and non-pharmacologic comfort measures Outcome: Progressing   Problem: Health Behavior/Discharge Planning: Goal: Ability to manage health-related needs will improve Outcome: Progressing   Problem: Clinical Measurements: Goal: Ability to maintain clinical measurements within normal limits will improve Outcome: Progressing Goal: Will remain free from infection Outcome: Progressing Goal: Diagnostic test results will improve Outcome: Progressing Goal: Respiratory complications will improve Outcome: Progressing Goal: Cardiovascular complication will be avoided Outcome: Progressing   Problem: Activity: Goal: Risk for activity intolerance will decrease Outcome: Progressing   Problem: Nutrition: Goal: Adequate nutrition will be maintained Outcome: Progressing   Problem: Coping: Goal: Level of anxiety will decrease Outcome: Progressing   Problem: Elimination: Goal: Will not experience complications related to bowel motility Outcome: Progressing Goal: Will not experience complications related to urinary retention Outcome: Progressing   Problem: Pain Managment: Goal: General experience of comfort will improve Outcome: Progressing   Problem: Safety: Goal: Ability to remain free from injury will improve Outcome: Progressing   Problem: Skin Integrity: Goal: Risk for impaired skin integrity will decrease Outcome: Progressing

## 2019-07-07 NOTE — Progress Notes (Signed)
New IV site inserted per patient's request. Current site saline locked, flushed easily. Continues to complain of neck and head pain (old injury per patient). Tolerating po liquids without n/v or complaint of abd pain.

## 2019-07-07 NOTE — Progress Notes (Signed)
Patient Demographics:    [name], is a 63 y.o. female, DOB - 05-29-1956, ZOX:[id number]  Admit date - 07/05/2019   Admitting Physician Pratik Hoover Brunette, DO  Outpatient Primary MD for the patient is Breanna Robert, MD  LOS - 0  Chief Complaint  Patient presents with  . Abdominal Pain        Subjective:    [name] today has no fevers, no emesis,  No chest pain, overnight patient had episode of significant hypoxia not correctable by nasal cannula, actually required nonrebreather bag she was very lethargic and sleepy--- prior to that she has received benzos and opiates  Assessment  & Plan :    Principal Problem:   PNA (pneumonia) Active Problems:   Acute pancreatitis   Acute Hypoxic Respiratory failure,    COPD with emphysema (HCC)   Leukocytosis  Brief summary  63 y.o. female with medical history significant for chronic pain, COPD/emphysema, depression, IBS, peptic ulcer disease, prior cholecystectomy and appendectomy admitted on 07/06/2019 with intractable emesis and concerns about mild acute pancreatitis-- --overnight patient had episode of significant hypoxia not correctable by nasal cannula, actually required nonrebreather bag she was very lethargic and sleepy--- prior to that she has received benzos and opiates  A/p 1)CAP--- suspect aspiration pneumonia in the patient with recurrent emesis, repeat chest x-ray on 07/07/2019 with bilateral infiltrates consistent with pneumonia --- Treat empirically with IV Unasyn for suspected aspiration pneumonia, continue bronchodilators and supplemental oxygen  2) acute hypoxic respiratory failure--- due to aspiration pneumonia as above #1  3)mild acute pancreatitis----clinically and lab data wise much improved, lipase is down to 26 from 131, patient is tolerating  liquid diet well,--- CT abdomen and pelvis and abdominal ultrasound without acute findings   4) chronic pain syndrome/anxiety disorder--- be judicious with benzos and opiates to avoid further worsening of patient's respiratory status and hypoxia  5)COPD/Emphysema--treat as above #1  6) acute toxic encephalopathy-- overnight patient had episode of significant hypoxia with significant lethargy/altered mentation not correctable by nasal cannula, actually required non-rebreather bag, she was very lethargic and sleepy--- prior to that she has received benzos and opiates   ----- be judicious with benzos and opiates to avoid further worsening of patient's respiratory status and hypoxia  Disposition/Need for in-Hospital Stay- patient unable to be discharged at this time due to acute hypoxia due to presumed aspiration pneumonia requiring oxygen supplementation IV antibiotics  Code Status : full  Family Communication:   NA (patient is alert, awake and coherent)  Disposition Plan  : TBD  Consults  :  na  DVT Prophylaxis  :  Lovenox -  - SCDs   Lab Results  Component Value Date   PLT 250 07/07/2019    Inpatient Medications  Scheduled Meds: . baclofen  10 mg Oral BID  . clonazePAM  0.5 mg Oral TID  . enoxaparin (LOVENOX) injection  30 mg Subcutaneous Q24H  . fluticasone  2 spray Each Nare Daily  . gabapentin  300 mg Oral TID  . guaiFENesin  600 mg Oral BID  . ipratropium-albuterol  3 mL Nebulization TID  . mometasone-formoterol  2 puff Inhalation BID  . pantoprazole (PROTONIX) IV  40 mg Intravenous Q24H   Continuous Infusions: . ampicillin-sulbactam (UNASYN) IV    .  lactated ringers 150 mL/hr at 07/07/19 1150   PRN Meds:.albuterol, ondansetron **OR** ondansetron (ZOFRAN) IV, oxyCODONE    Anti-infectives (From admission, onward)   Start     Dose/Rate Route Frequency Ordered Stop   07/07/19 1500  Ampicillin-Sulbactam (UNASYN) 3 g in sodium chloride 0.9 % 100 mL IVPB     3 g 200 mL/hr over 30 Minutes Intravenous Every 8 hours 07/07/19 1418          Objective:    Vitals:   07/07/19 0615 07/07/19 0721 07/07/19 0825 07/07/19 1425  BP:   100/68   Pulse:   83   Resp:   16   Temp:   97.6 F (36.4 C)   TempSrc:   Oral   SpO2: (!) 82% 96% 99% (!) 88%  Weight:      Height:       Wt Readings from Last 3 Encounters:  07/05/19 36.3 kg  06/12/19 36.3 kg  02/25/19 41.9 kg     Intake/Output Summary (Last 24 hours) at 07/07/2019 1433 Last data filed at 07/07/2019 1000 Gross per 24 hour  Intake 120 ml  Output 800 ml  Net -680 ml   Physical Exam  Gen:- Awake Alert,  In no apparent distress  HEENT:- [place].AT, No sclera icterus Nose- [place] 3L/min Neck-Supple Neck,No JVD,.  Lungs-diminished in bases with scattered rhonchi bilaterally, no significant wheezing at this time CV- S1, S2 normal, regular  Abd-  +ve B.Sounds, Abd Soft, epigastric discomfort without rebound or guarding   Extremity/Skin:- No  edema, pedal pulses present  Psych-affect is appropriate, oriented x3 Neuro-no new focal deficits, no tremors   Data Review:   Micro Results Recent Results (from the past 240 hour(s))  SARS Coronavirus 2 (CEPHEID - Performed [facility] lab), Hosp Order     Status: None   Collection Time: 07/06/19 12:37 PM   Specimen: Nasopharyngeal Swab  Result Value Ref Range Status   SARS Coronavirus 2 NEGATIVE NEGATIVE Final    Comment: (NOTE) If result is NEGATIVE SARS-CoV-2 target nucleic acids are NOT DETECTED. The SARS-CoV-2 RNA is generally detectable in upper and lower  respiratory specimens during the acute phase of infection. The lowest  concentration of SARS-CoV-2 viral copies this assay can detect is 250  copies / mL. A negative result does not preclude SARS-CoV-2 infection  and should not be used as the sole basis for treatment or other  patient management decisions.  A negative result may occur with  improper specimen collection / handling, submission of specimen other  than nasopharyngeal swab, presence of viral mutation(s) within the   areas targeted by this assay, and inadequate number of viral copies  (<250 copies / mL). A negative result must be combined with clinical  observations, patient history, and epidemiological information. If result is POSITIVE SARS-CoV-2 target nucleic acids are DETECTED. The SARS-CoV-2 RNA is generally detectable in upper and lower  respiratory specimens dur ing the acute phase of infection.  Positive  results are indicative of active infection with SARS-CoV-2.  Clinical  correlation with patient history and other diagnostic information is  necessary to determine patient infection status.  Positive results do  not rule out bacterial infection or co-infection with other viruses. If result is PRESUMPTIVE POSTIVE SARS-CoV-2 nucleic acids MAY BE PRESENT.   A presumptive positive result was obtained on the submitted specimen  and confirmed on repeat testing.  While 2019 novel coronavirus  (SARS-CoV-2) nucleic acids may be present in the submitted sample  additional confirmatory testing may be necessary for epidemiological  and / or clinical management purposes  to differentiate between  SARS-CoV-2 and other Sarbecovirus currently known to infect humans.  If clinically indicated additional testing with an alternate test  methodology [phone]([id number]) is advised. The SARS-CoV-2 RNA is generally  detectable in upper and lower respiratory sp ecimens during the acute  phase of infection. The expected result is Negative. Fact Sheet for Patients:  BoilerBrush.com.cyhttps://www.fda.gov/media/136312/download Fact Sheet for Healthcare Providers: https://pope.com/https://www.fda.gov/media/136313/download This test is not yet approved or cleared by the [place] FDA and has been authorized for detection and/or diagnosis of SARS-CoV-2 by FDA under an Emergency Use Authorization (EUA).  This EUA will remain in effect (meaning this test can be used) for the duration of the COVID-19 declaration under Section 564(b)(1) of the Act, 21 U.S.C.  section 360bbb-3(b)(1), unless the authorization is terminated or revoked sooner. Performed at [facility], [address] [place], [place] [address]     Radiology Reports Dg Chest 2 View  Result Date: 07/05/2019 CLINICAL DATA:  Upper abdominal pain radiating to the right flank with chest pain. Shortness of breath. EXAM: CHEST - 2 VIEW COMPARISON:  06/12/2019 FINDINGS: Heart size is normal. Mild aortic atherosclerosis. Pulmonary hyperinflation and scarring consistent with emphysema. No sign of active infiltrate, mass, effusion or collapse. No acute bone finding. IMPRESSION: Emphysema.  No active disease. Electronically Signed   By: Paulina FusiMark  Shogry M.D.   On: 07/05/2019 21:26   Dg Chest 2 View  Result Date: 06/12/2019 CLINICAL DATA:  Syncopal episode, history COPD, smoker EXAM: CHEST - 2 VIEW COMPARISON:  04/16/2017 FINDINGS: Normal heart size, mediastinal contours, and pulmonary vascularity. Emphysematous and minimal bronchitic changes consistent with COPD. No pulmonary infiltrate, pleural effusion, or pneumothorax. Bones demineralized. IMPRESSION: COPD changes without acute infiltrate. Electronically Signed   By: Ulyses SouthwardMark  Boles M.D.   On: 06/12/2019 16:22   Ct Head Wo Contrast  Result Date: 06/12/2019 CLINICAL DATA:  Unwitnessed syncopal episode 3 days, with standing at the foot of the bed packing of bag when she fell, awoke on floor, unknown duration of loss of consciousness, some confusion the next day, slipped and fell yesterday striking head in bathroom, neck pain, facial soreness and bruising EXAM: CT HEAD WITHOUT CONTRAST CT CERVICAL SPINE WITHOUT CONTRAST TECHNIQUE: Multidetector CT imaging of the head and cervical spine was performed following the standard protocol without intravenous contrast. Multiplanar CT image reconstructions of the cervical spine were also generated. COMPARISON:  CT head 01/08/2011 FINDINGS: CT HEAD FINDINGS Brain: Normal ventricular morphology. No midline shift or mass  effect. Streak artifacts at skull base and temporal lobes. Otherwise normal appearance of brain parenchyma. No definite intracranial hemorrhage, mass lesion or evidence of acute infarction. No extra-axial fluid collections. Vascular: No hyperdense vessels Skull: Intact Sinuses/Orbits: Clear Other: N/A CT CERVICAL SPINE FINDINGS Alignment: Normal Skull base and vertebrae: Vertebral body heights maintained. Scattered disc space narrowing and small endplate spurs greatest at C5-C6. No fracture or bone destruction. Osseous mineralization normal. Skull base intact. Soft tissues and spinal canal: Prevertebral soft tissues normal thickness. Regional cervical soft tissues unremarkable Disc levels:  No additional abnormalities Upper chest: Lung apices clear Other: N/A IMPRESSION: No acute intracranial abnormalities. 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 No acute cervical spine abnormalities. Electronically Signed   By: Ulyses SouthwardMark  Boles M.D.   On: 06/12/2019 16:13   Ct Cervical Spine Wo Contrast  Result Date: 06/12/2019 CLINICAL DATA:  Unwitnessed syncopal episode 3 days, with standing  at the foot of the bed packing of bag when she fell, awoke on floor, unknown duration of loss of consciousness, some confusion the next day, slipped and fell yesterday striking head in bathroom, neck pain, facial soreness and bruising EXAM: CT HEAD WITHOUT CONTRAST CT CERVICAL SPINE WITHOUT CONTRAST TECHNIQUE: Multidetector CT imaging of the head and cervical spine was performed following the standard protocol without intravenous contrast. Multiplanar CT image reconstructions of the cervical spine were also generated. COMPARISON:  CT head 01/08/2011 FINDINGS: CT HEAD FINDINGS Brain: Normal ventricular morphology. No midline shift or mass effect. Streak artifacts at skull base and temporal lobes. Otherwise normal appearance of brain parenchyma. No definite intracranial hemorrhage, mass lesion or evidence of acute  infarction. No extra-axial fluid collections. Vascular: No hyperdense vessels Skull: Intact Sinuses/Orbits: Clear Other: N/A CT CERVICAL SPINE FINDINGS Alignment: Normal Skull base and vertebrae: Vertebral body heights maintained. Scattered disc space narrowing and small endplate spurs greatest at C5-C6. No fracture or bone destruction. Osseous mineralization normal. Skull base intact. Soft tissues and spinal canal: Prevertebral soft tissues normal thickness. Regional cervical soft tissues unremarkable Disc levels:  No additional abnormalities Upper chest: Lung apices clear Other: N/A IMPRESSION: No acute intracranial abnormalities. 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 No acute cervical spine abnormalities. Electronically Signed   By: Lavonia Dana M.D.   On: 06/12/2019 16:13   Ct Abdomen Pelvis W Contrast  Result Date: 07/06/2019 CLINICAL DATA:  Epigastric abdominal pain EXAM: CT ABDOMEN AND PELVIS WITH CONTRAST TECHNIQUE: Multidetector CT imaging of the abdomen and pelvis was performed using the standard protocol following bolus administration of intravenous contrast. CONTRAST:  44mL OMNIPAQUE IOHEXOL 300 MG/ML  SOLN COMPARISON:  01/13/2019 FINDINGS: LOWER CHEST: There is no basilar pleural or apical pericardial effusion. HEPATOBILIARY: The hepatic contours and density are normal. There is no intra- or extrahepatic biliary dilatation. Status post cholecystectomy. PANCREAS: The pancreatic parenchymal contours are normal and there is no ductal dilatation. There is no peripancreatic fluid collection. SPLEEN: Normal. ADRENALS/URINARY TRACT: --Adrenal glands: Normal. --Right kidney/ureter: No hydronephrosis, nephroureterolithiasis, perinephric stranding or solid renal mass. --Left kidney/ureter: No hydronephrosis, nephroureterolithiasis, perinephric stranding or solid renal mass. --Urinary bladder: Normal for degree of distention STOMACH/BOWEL: --Stomach/Duodenum: There is no hiatal  hernia or other gastric abnormality. The duodenal course and caliber are normal. --Small bowel: Multiple areas of small bowel narrowing but no dilatation or inflammation. --Colon: No takes focal abnormality. --Appendix: Surgically absent. VASCULAR/LYMPHATIC: Normal course and caliber of the major abdominal vessels. No abdominal or pelvic lymphadenopathy. REPRODUCTIVE: No free fluid in the pelvis. MUSCULOSKELETAL. No bony spinal canal stenosis or focal osseous abnormality. OTHER: None. IMPRESSION: No acute abdominal or pelvic abnormality. There are multiple areas of small bowel narrowing, but no dilatation or inflammation. Electronically Signed   By: Ulyses Jarred M.D.   On: 07/06/2019 03:24   Dg Chest Port 1 View  Result Date: 07/07/2019 CLINICAL DATA:  Hypoxia. EXAM: PORTABLE CHEST 1 VIEW COMPARISON:  07/05/2019. FINDINGS: Mediastinum hilar structures normal. Heart size normal. Bibasilar atelectasis. Bibasilar infiltrates consistent with pneumonia. No pleural effusion or pneumothorax. IMPRESSION: Bibasilar atelectasis. Bibasilar infiltrates consistent with pneumonia. Followup PA and lateral chest X-ray is recommended in 3-4 weeks following trial of antibiotic therapy to ensure resolution and exclude underlying malignancy. Electronically Signed   By: Marcello Moores  Register   On: 07/07/2019 07:44   US Abdomen Limited Ruq  Result Date: 07/06/2019 CLINICAL DATA:  Epigastric abdominal pain. Previous cholecystectomy. Status post laparoscopic appendectomy on 01/08/2019. EXAM:  ULTRASOUND ABDOMEN LIMITED RIGHT UPPER QUADRANT COMPARISON:  Abdomen and pelvis CT obtained earlier today. FINDINGS: Gallbladder: Surgically absent. Common bile duct: Diameter: 3.8 mm Liver: No focal lesion identified. Within normal limits in parenchymal echogenicity. Tiny amount of fluid adjacent to the dome of the liver on the right. Portal vein is patent on color Doppler imaging with normal direction of blood flow towards the liver. IMPRESSION:  1. Tiny amount of fluid adjacent to the dome of the liver on the right. This has an appearance suggesting minimal loculated ascites or a very small residual fluid collection from previous surgery. 2. Status post cholecystectomy. 3. No biliary dilatation. Electronically Signed   By: Beckie SaltsSteven  Reid M.D.   On: 07/06/2019 09:32     CBC Recent Labs  Lab 07/05/19 2245 07/06/19 1405 07/07/19 0558  WBC 9.4 8.6 27.7*  HGB 14.2 13.8 13.6  HCT 42.9 42.3 45.0  PLT 211 181 250  MCV 94.3 96.4 102.5*  MCH 31.2 31.4 31.0  MCHC 33.1 32.6 30.2  RDW 14.4 14.5 14.7    Chemistries  Recent Labs  Lab 07/05/19 2245 07/06/19 1405 07/07/19 0558  NA 140  --  141  K 3.7  --  3.8  CL 105  --  104  CO2 27  --  28  GLUCOSE 86  --  150*  BUN 18  --  16  CREATININE 0.53 0.53 0.53  CALCIUM 9.5  --  8.8*  AST 21  --  171*  ALT 21  --  172*  ALKPHOS 91  --  132*  BILITOT 0.5  --  0.7   ------------------------------------------------------------------------------------------------------------------ Recent Labs    07/06/19 1405  CHOL 156  HDL 79  LDLCALC 58  TRIG 93  CHOLHDL 2.0    No results found for: HGBA1C ------------------------------------------------------------------------------------------------------------------ No results for input(s): TSH, T4TOTAL, T3FREE, THYROIDAB in the last 72 hours.  Invalid input(s): FREET3 ------------------------------------------------------------------------------------------------------------------ No results for input(s): VITAMINB12, FOLATE, FERRITIN, TIBC, IRON, RETICCTPCT in the last 72 hours.  Coagulation profile No results for input(s): INR, PROTIME in the last 168 hours.  No results for input(s): DDIMER in the last 72 hours.  Cardiac Enzymes No results for input(s): CKMB, TROPONINI, MYOGLOBIN in the last 168 hours.  Invalid input(s): CK  ------------------------------------------------------------------------------------------------------------------ No results found for: BNP   Shon Haleourage Zenya Hickam M.D on 07/07/2019 at 2:33 PM  Go to [email or web address] - for contact info  [facility] - Office  [phone]

## 2019-07-07 NOTE — Progress Notes (Signed)
Patient's O2 sats in the 70's on room air. Patient arouses to voice.  Placed on O2 at 2 L [place] with O2 sats coming up to the mid 80's. 100% NRB placed on patient with O2 sats coming up to mid to high 90's. Dr. Maudie Mercury paged and discussed case . Patient arouses to voice and coughs on command. Patient maintaining O2 sat on at 100 % on the NRB. Respiratory therapy in to evaluate patient. Will continue to monitor.

## 2019-07-08 LAB — COMPREHENSIVE METABOLIC PANEL
ALT: 116 U/L — ABNORMAL HIGH (ref 0–44)
AST: 68 U/L — ABNORMAL HIGH (ref 15–41)
Albumin: 3.2 g/dL — ABNORMAL LOW (ref 3.5–5.0)
Alkaline Phosphatase: 121 U/L (ref 38–126)
Anion gap: 7 (ref 5–15)
BUN: 11 mg/dL (ref 8–23)
CO2: 31 mmol/L (ref 22–32)
Calcium: 8.7 mg/dL — ABNORMAL LOW (ref 8.9–10.3)
Chloride: 103 mmol/L (ref 98–111)
Creatinine, Ser: 0.55 mg/dL (ref 0.44–1.00)
GFR calc Af Amer: >60 mL/min (ref >60)
GFR calc non Af Amer: >60 mL/min (ref >60)
Glucose, Bld: 106 mg/dL — ABNORMAL HIGH (ref 70–99)
Potassium: 3.6 mmol/L (ref 3.5–5.1)
Sodium: 141 mmol/L (ref 135–145)
Total Bilirubin: 0.5 mg/dL (ref 0.3–1.2)
Total Protein: 5.8 g/dL — ABNORMAL LOW (ref 6.5–8.1)

## 2019-07-08 LAB — CBC
HCT: 40.8 % (ref 36.0–46.0)
Hemoglobin: 13 g/dL (ref 12.0–15.0)
MCH: 30.9 pg (ref 26.0–34.0)
MCHC: 31.9 g/dL (ref 30.0–36.0)
MCV: 96.9 fL (ref 80.0–100.0)
Platelets: 158 10*3/uL (ref 150–400)
RBC: 4.21 MIL/uL (ref 3.87–5.11)
RDW: 14.7 % (ref 11.5–15.5)
WBC: 11.1 10*3/uL — ABNORMAL HIGH (ref 4.0–10.5)
nRBC: 0 % (ref 0.0–0.2)

## 2019-07-08 MED ORDER — RANITIDINE HCL 300 MG PO TABS: 300.0000 mg | ORAL_TABLET | Freq: Every day | ORAL | 1 refills | Status: DC

## 2019-07-08 MED ORDER — AMOXICILLIN-POT CLAVULANATE 875-125 MG PO TABS: 1.0000 | ORAL_TABLET | Freq: Two times a day (BID) | ORAL | 0 refills | Status: AC

## 2019-07-08 MED ORDER — AMOXICILLIN-POT CLAVULANATE 875-125 MG PO TABS
1.0000 | ORAL_TABLET | Freq: Once | ORAL | Status: AC
  Administered 2019-07-08: 1 via ORAL
  Filled 2019-07-08: qty 1

## 2019-07-08 MED ORDER — CLONAZEPAM 1 MG PO TABS: 0.5000 mg | ORAL_TABLET | Freq: Two times a day (BID) | ORAL | 0 refills | Status: AC | PRN

## 2019-07-08 MED ORDER — BACLOFEN 10 MG PO TABS: 5.0000 mg | ORAL_TABLET | Freq: Two times a day (BID) | ORAL | 1 refills | Status: DC

## 2019-07-08 MED ORDER — ALBUTEROL SULFATE (2.5 MG/3ML) 0.083% IN NEBU: 2.5000 mg | INHALATION_SOLUTION | RESPIRATORY_TRACT | 12 refills | Status: DC | PRN

## 2019-07-08 MED ORDER — BOOST / RESOURCE BREEZE PO LIQD CUSTOM
1.0000 | Freq: Two times a day (BID) | ORAL | Status: DC
  Administered 2019-07-08: 1 via ORAL

## 2019-07-08 MED ORDER — GUAIFENESIN ER 600 MG PO TB12: 600.0000 mg | ORAL_TABLET | Freq: Two times a day (BID) | ORAL | 0 refills | Status: DC

## 2019-07-08 NOTE — Discharge Instructions (Signed)
1) avoid tobacco exposure 2) take medications as prescribed 3)Avoid ibuprofen/Advil/Aleve/Motrin/Goody Powders/Naproxen/BC powders/Meloxicam/Diclofenac/Indomethacin and other Nonsteroidal anti-inflammatory medications as these will make you more likely to bleed and can cause stomach ulcers, can also cause Kidney problems.

## 2019-07-08 NOTE — Progress Notes (Signed)
SATURATION QUALIFICATIONS: (This note is used to comply with regulatory documentation for home oxygen)  Patient Saturations on Room Air at Rest = 95%  Patient Saturations on Room Air while Ambulating = 93%  Please briefly explain why patient needs home oxygen: Patient does not require supplemental oxygen at rest or with ambulation. Patient maintains O2 sats above 90% on room air at rest and with ambulation.

## 2019-07-08 NOTE — Progress Notes (Signed)
Patient called nurse to room and was concerned that was not taking medications in the dosages that she is prescribed at home.  Explained to patient since she has respiratory event earlier in day, that medication was decreased for her patient safety.  Also, went through medication list that was in computer for patient. Medication was ordered as it was written in the computer.  Patient then countered that her husband could not bring her medications because his back was hurt.  Pharmacy had reconciled her medications.  Pharmacy tech had attempted to call patient personal pharmacy to verify her dosages, but the pharmacy was closed at that time. 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 Explained to patient that she is still getting her medication that was ordered at home, but in a smaller dosage, so should not have severe withdrawals.  Patient then began to complain of head pain, but then went on to say that was a chronic injury from where patient had fallen in the floor at home in the bathroom. Patient the requests that her pain medication be given every four hours so her pain does not get out of control.  Nurse will continue to monitor patient.

## 2019-07-08 NOTE — Progress Notes (Signed)
Initial Nutrition Assessment  DOCUMENTATION CODES:  Not applicable  INTERVENTION:  Boost Breeze po BID, each supplement provides 250 kcal and 9 grams of protein  Removed milk from menu d/t reported intolerance  NUTRITION DIAGNOSIS:  Increased nutrient needs related to acute illness(Pancreatitis, PNA) as evidenced by estimated needs for this condition  GOAL:  Patient will meet greater than or equal to 90% of their needs  MONITOR:  Labs, I & O's, Diet advancement, Supplement acceptance, PO intake, Weight trends  REASON FOR ASSESSMENT:  Malnutrition Screening Tool    ASSESSMENT:  63 y/o female PMHx Chronic pain, COPD/emphysema, Depression, IBS, PUD. Hospitalized 6 months ago for perforated appendicitis s/p appendectomy. Presented to ED w/ abdominal pain, n/v and diarrhea x2-4 days. In ED, lipase mildly elevated. Admitted for management of mild pancreatitis.   Patient fairly lethargic on RD arrival, but able to converse. She says she is still having mild pain with liquid intake. She has not yet tried any of the regular textures- her lunch tray is untouched next to her bedside.   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 She says she has chronically intermittent constipation/diarrhea. She reports frequent intake of Ensure Plus at baseline. She says that on days when her stomach "is in too much pain to eat" she will rely on Ensure, drinking as many as 6 a day. She says normally, she will drink only a couple.   Pt gives a UBW of 105 lbs. She says last time she was weighed was at her doctors office and her weight was "in the 80s". Today, her bed weight is 104 lbs. Per chart, pt was indeed maintaining a weight of 100-105 lbs prior to her surgery 6 months ago. 2 months after her surgery, she had lost to 92.3 lbs. There have been no further measured weights added to chart since then.   RD recommended only drinking 1-2 Ensures/day for the time being, given  their high fat content. She is agreeable to trying [employer]. She asks if the meal she brought was appropriate for her pancreatitis. RD assessed it and states it was, other than the pudding. She says she cant drink milk as it is one food that always upsets her bowel. Will remove from menu.   Labs: BG: 106-150, Albumin: 3.2, WBC: 27.7->11.1, Lipase: 171->68 Meds: ppi, IVF, IV abx d/cd, prn opiates.   Recent Labs  Lab 07/05/19 2245 07/06/19 1405 07/07/19 0558 07/08/19 0551  NA 140  --  141 141  K 3.7  --  3.8 3.6  CL 105  --  104 103  CO2 27  --  28 31  BUN 18  --  16 11  CREATININE 0.53 0.53 0.53 0.55  CALCIUM 9.5  --  8.8* 8.7*  GLUCOSE 86  --  150* 106*   NUTRITION - FOCUSED PHYSICAL EXAM: Deferred  Diet Order:   Diet Order            Diet regular Room service appropriate? Yes; Fluid consistency: Thin  Diet effective now             EDUCATION NEEDS:  No education needs have been identified at this time  Skin:  Skin Assessment: Reviewed RN Assessment  Last BM:  Unknown  Height:  Ht Readings from Last 1 Encounters:  07/05/19 5\' 1"  (1.549 m)   Weight:  Wt Readings from Last 1 Encounters:  07/08/19 47.2 kg   Ideal Body Weight:  47.7  kg  BMI:  Body mass index is 19.65 kg/m.  Estimated Nutritional Needs:  Kcal:  1550-1700 kcals (32-36 kcal/kg bw) Protein:  72-82g Pro (1.5-1.7g/kg bw) Fluid:  >1.4L (30 ml/kcal)  Christophe LouisNathan Amontae Ng RD, LDN, CNSC Clinical Nutrition Available Tues-Sat via Pager: [phone] 07/08/2019 1:08 PM

## 2019-07-08 NOTE — TOC Transition Note (Signed)
Transition of Care [facility]) - CM/SW Discharge Note   Patient Details  Name: [name] MRN: [id number] Date of Birth: 03-Aug-1956  Transition of Care [facility]) CM/SW Contact:  Glee Lashomb, Chauncey Reading, RN Phone Number: 07/08/2019, 2:01 PM   Clinical Narrative:   Patient discharging home today. Does not need oxygen, but does need a nebulizer machine. Referral given to [place] for possible charity. Patient does not have insurance. She uses a prescription assistance program for her expensive medications. She is working with a Science writer at social services in [place] in regards to her eligibility for medicaid or other services.     Final next level of care: Home/Self Care         Discharge Plan and Services     Post Acute Care Choice: Durable Medical Equipment          DME Arranged: Nebulizer/meds DME Agency: [employer] Date DME Agency Contacted: 07/08/19 Time DME Agency Contacted: [id number] Representative spoke with at DME Agency: [place] Determinants of Health ([place]) Interventions     Readmission Risk Interventions No flowsheet data found.

## 2019-07-08 NOTE — Discharge Summary (Signed)
[name], is a 63 y.o. female  DOB Apr 26, 1956  MRN [id number].  Admission date:  07/05/2019  Admitting Physician  Erick Blinks, DO  Discharge Date:  07/08/2019   Primary MD  Smith Robert, MD  Recommendations for primary care physician for things to follow:   1) avoid tobacco exposure 2) take medications as prescribed 3)Avoid ibuprofen/Advil/Aleve/Motrin/Goody Powders/Naproxen/BC powders/Meloxicam/Diclofenac/Indomethacin and other Nonsteroidal anti-inflammatory medications as these will make you more likely to bleed and can cause stomach ulcers, can also cause Kidney problems.    Admission Diagnosis  Epigastric abdominal pain [R10.13] Acute pancreatitis, unspecified complication status, unspecified pancreatitis type [K85.90]   Discharge Diagnosis  Epigastric abdominal pain [R10.13] Acute pancreatitis, unspecified complication status, unspecified pancreatitis type [K85.90]    Principal Problem:   PNA (pneumonia) Active Problems:   Acute Hypoxic Respiratory failure,    Acute pancreatitis   COPD with emphysema (HCC)   Leukocytosis      Past Medical History:  Diagnosis Date   Chronic pain    gneralized denies fibromyalgia   COPD (chronic obstructive pulmonary disease) (HCC)    Depression    Diverticulosis    IBS (irritable bowel syndrome)    Panic disorder    Panic disorder    PTSD (post-traumatic stress disorder)    PTSD (post-traumatic stress disorder) 01/08/2019   Temporomandibular joint disorder (TMJ)    age 49 fractured TMJ - repaired by oral surgeon    Past Surgical History:  Procedure Laterality Date   CHOLECYSTECTOMY     EYE SURGERY     FRACTURE SURGERY     LAPAROSCOPIC APPENDECTOMY N/A 01/08/2019   Procedure: APPENDECTOMY LAPAROSCOPIC;  Surgeon: [name], MD;  Location: [facility];  Service: General;  Laterality: N/A;       HPI  from the history  and physical done on the day of admission:   Chief Complaint: Abdominal pain/N/V  HPI: [name] is a 63 y.o. female with medical history significant for chronic pain, COPD/emphysema, depression, IBS, peptic ulcer disease, prior cholecystectomy and appendectomy, and prior tobacco abuse who presented to the emergency department with epigastric abdominal pain that began last night which was noted to be initially intermittent and then constant in nature.  She describes it as a sharp and burning sensation without any significant radiation of the pain.  She states that she had diarrhea for 2 days prior to this symptom starting.  She denies any change in her foods or recent antibiotics.  She has some alleviation of pain with positional changes and that eating seems to make her pain worse.  She has had similar pains previously which have spontaneously resolved in several minutes and this is been ongoing for the last year.  She has had prior perforated appendicitis and underwent urgent laparoscopic appendectomy earlier this year and has also had cholecystectomy.  She is also noted to have peptic ulcer disease.  She denies any significant chest pain, shortness of breath, fever, cough, or rhinorrhea.  COVID testing has been ordered and currently  pending.  Patient denies any current alcohol or tobacco use.   ED Course: Vital signs noted to be stable patient was afebrile.  Laboratory data with no acute findings or LFT elevation aside from lipase noted to be elevated at 131.  Ultrasound the abdomen with trace fluid around prior cholecystectomy site with no common bile duct dilation noted.  CT of the abdomen with no acute findings.  Two-view chest x-ray with emphysema no acute findings.  Urine analysis with no findings of UTI.  EKG with normal sinus rhythm at 85 bpm and troponin negative x2 sets.  She has been given 2 L of IV fluid as well as some fentanyl for pain management and continues to complain of ongoing  pain.      Hospital Course:   Brief Summary 63 y.o.femalewith medical history significant forchronic pain, COPD/emphysema, depression, IBS, peptic ulcer disease, prior cholecystectomy and appendectomy admitted on 07/06/2019 with intractable emesis and concerns about mild acute pancreatitis-- --in the early hours of 07/07/2019 patient had episode of significant hypoxia not correctable by nasal cannula, actually required nonrebreather bag she was very lethargic and sleepy--- prior to that she has received benzos and opiates -Hypoxia is now returned  A/p 1)CAP--- suspect aspiration pneumonia in the patient with recurrent emesis and oversedation secondary to benzo/opiate use, repeat chest x-ray on 07/07/2019 with bilateral infiltrates consistent with pneumonia --Hypoxia has resolved, -Patient was treated with IV Unasyn, okay to discharge home on p.o. Augmentin  2) acute hypoxic respiratory failure--- due to aspiration pneumonia as above #1--- hypoxia has resolved  3)mild acute pancreatitis/Gastritis----clinically and lab data wise much improved, lipase is down to 26 from 131, patient is tolerating regular diet well,--- CT abdomen and pelvis and abdominal ultrasound without acute findings.  May use ranitidine 300 nightly  4) chronic pain syndrome/anxiety disorder--- be judicious with benzos and opiates to avoid further worsening of patient's respiratory status and hypoxia --- Patient repeatedly asking for benzos and opiates, patient reminded that we do not give New prescriptions for chronic pain upon discharge from hospital--- she is advised to follow-up with PCP  5)COPD/Emphysema--- stable, hypoxia resolved, continue bronchodilators, abstinence from tobacco advised  6) acute toxic encephalopathy-- in the early hours of 07/07/2019 patient had episode of significant hypoxia with significant lethargy/altered mentation not correctable by nasal cannula, actually required non-rebreather bag, she  was very lethargic and sleepy--- prior to that she has received benzos and opiates --- Patient is currently back to baseline, no further hypoxia, mental status is back to baseline    ----- be judicious with benzos and opiates to avoid further worsening of patient's respiratory status and hypoxia  Disposition--discharge home  Code Status : full  Family Communication:   NA (patient is alert, awake and coherent) --- When asked if okay to call patient's spouse with an update patient said no need to call spouse with an update Disposition Plan  : TBD  Consults  :  na  Discharge Condition: stable without hypoxia  Follow UP--- PCP   Diet and Activity recommendation:  As advised  Discharge Instructions    Discharge Instructions    Call MD for:  difficulty breathing, headache or visual disturbances   Complete by: As directed    Call MD for:  persistant dizziness or light-headedness   Complete by: As directed    Call MD for:  persistant nausea and vomiting   Complete by: As directed    Call MD for:  temperature >100.4   Complete by: As directed  Diet - low sodium heart healthy   Complete by: As directed    Discharge instructions   Complete by: As directed    1) avoid tobacco exposure 2) take medications as prescribed 3)Avoid ibuprofen/Advil/Aleve/Motrin/Goody Powders/Naproxen/BC powders/Meloxicam/Diclofenac/Indomethacin and other Nonsteroidal anti-inflammatory medications as these will make you more likely to bleed and can cause stomach ulcers, can also cause Kidney problems. 4) avoid excessive narcotic, and sleep aid and benzodiazepine medication use   Increase activity slowly   Complete by: As directed         Discharge Medications     Allergies as of 07/08/2019      Reactions   Nsaids    Advised not to take    Toradol [ketorolac Tromethamine] Nausea And Vomiting, Swelling   TONGUE SWELLING   Tramadol Nausea And Vomiting   Benadryl [diphenhydramine Hcl]  Palpitations   Insomnia   Ceftin [cefuroxime Axetil] Rash   Compazine Palpitations   Insomnia   Epinephrine Palpitations   Insomina      Medication List    STOP taking these medications   gabapentin 300 MG capsule Commonly known as: NEURONTIN     TAKE these medications   albuterol 108 (90 Base) MCG/ACT inhaler Commonly known as: VENTOLIN HFA Inhale 1-2 puffs into the lungs every 6 (six) hours as needed for wheezing or shortness of breath. What changed: Another medication with the same name was added. Make sure you understand how and when to take each.   albuterol (2.5 MG/3ML) 0.083% nebulizer solution Commonly known as: PROVENTIL Take 3 mLs (2.5 mg total) by nebulization every 4 (four) hours as needed for wheezing or shortness of breath. What changed: You were already taking a medication with the same name, and this prescription was added. Make sure you understand how and when to take each.   amoxicillin-clavulanate 875-125 MG tablet Commonly known as: Augmentin Take 1 tablet by mouth 2 (two) times daily for 5 days.   Azelastine HCl 137 MCG/SPRAY Soln Place 1 spray into the nose daily as needed (for nasal congestion).   baclofen 10 MG tablet Commonly known as: LIORESAL Take 0.5 tablets (5 mg total) by mouth 2 (two) times daily. What changed:   how much to take  when to take this   budesonide-formoterol 160-4.5 MCG/ACT inhaler Commonly known as: SYMBICORT Inhale 2 puffs into the lungs 2 (two) times daily.   CENTRUM SILVER PO Take 1 tablet by mouth daily.   clonazePAM 1 MG tablet Commonly known as: KLONOPIN Take 0.5 tablets (0.5 mg total) by mouth 2 (two) times daily as needed for anxiety. *May take one additional tablet as needed for anxiety/PTSD What changed:   how much to take  when to take this  reasons to take this   ezetimibe 10 MG tablet Commonly known as: ZETIA Take 10 mg by mouth daily.   fluticasone 50 MCG/ACT nasal spray Commonly known as:  FLONASE Place 2 sprays into both nostrils daily.   guaiFENesin 600 MG 12 hr tablet Commonly known as: MUCINEX Take 1 tablet (600 mg total) by mouth 2 (two) times daily.   Horizant 600 MG Tbcr Generic drug: Gabapentin Enacarbil Take 1 tablet by mouth at bedtime as needed (FOR PAIN/NEUROPATHY).   ipratropium 0.03 % nasal spray Commonly known as: ATROVENT Place 1 spray into both nostrils daily as needed for rhinitis.   Muro 128 2 % ophthalmic solution Generic drug: sodium chloride Place 1 drop into both eyes 2 (two) times daily.   Muro 128 5 %  ophthalmic ointment Generic drug: sodium chloride Place 1 drop into both eyes at bedtime.   ranitidine 300 MG tablet Commonly known as: ZANTAC Take 1 tablet (300 mg total) by mouth at bedtime. For stomach            Durable Medical Equipment  (From admission, onward)         Start     Ordered   07/08/19 1232  For home use only DME Nebulizer/meds  Once    Comments: Dx J44.9  Question Answer Comment  Patient needs a nebulizer to treat with the following condition COPD (chronic obstructive pulmonary disease) (HCC)   Length of Need Lifetime      07/08/19 1231   07/08/19 1231  For home use only DME Nebulizer machine  Once    Comments: Dx J44.9  Question Answer Comment  Patient needs a nebulizer to treat with the following condition COPD (chronic obstructive pulmonary disease) (HCC)   Length of Need Lifetime      07/08/19 1231          Major procedures and Radiology Reports - PLEASE review detailed and final reports for all details, in brief -   Dg Chest 2 View  Result Date: 07/05/2019 CLINICAL DATA:  Upper abdominal pain radiating to the right flank with chest pain. Shortness of breath. EXAM: CHEST - 2 VIEW COMPARISON:  06/12/2019 FINDINGS: Heart size is normal. Mild aortic atherosclerosis. Pulmonary hyperinflation and scarring consistent with emphysema. No sign of active infiltrate, mass, effusion or collapse. No acute bone  finding. IMPRESSION: Emphysema.  No active disease. Electronically Signed   By: Paulina Fusi M.D.   On: 07/05/2019 21:26   Dg Chest 2 View  Result Date: 06/12/2019 CLINICAL DATA:  Syncopal episode, history COPD, smoker EXAM: CHEST - 2 VIEW COMPARISON:  04/16/2017 FINDINGS: Normal heart size, mediastinal contours, and pulmonary vascularity. Emphysematous and minimal bronchitic changes consistent with COPD. No pulmonary infiltrate, pleural effusion, or pneumothorax. Bones demineralized. IMPRESSION: COPD changes without acute infiltrate. Electronically Signed   By: Ulyses Southward M.D.   On: 06/12/2019 16:22   Ct Head Wo Contrast  Result Date: 06/12/2019 CLINICAL DATA:  Unwitnessed syncopal episode 3 days, with standing at the foot of the bed packing of bag when she fell, awoke on floor, unknown duration of loss of consciousness, some confusion the next day, slipped and fell yesterday striking head in bathroom, neck pain, facial soreness and bruising EXAM: CT HEAD WITHOUT CONTRAST CT CERVICAL SPINE WITHOUT CONTRAST TECHNIQUE: Multidetector CT imaging of the head and cervical spine was performed following the standard protocol without intravenous contrast. Multiplanar CT image reconstructions of the cervical spine were also generated. COMPARISON:  CT head 01/08/2011 FINDINGS: CT HEAD FINDINGS Brain: Normal ventricular morphology. No midline shift or mass effect. Streak artifacts at skull base and temporal lobes. Otherwise normal appearance of brain parenchyma. No definite intracranial hemorrhage, mass lesion or evidence of acute infarction. No extra-axial fluid collections. Vascular: No hyperdense vessels Skull: Intact Sinuses/Orbits: Clear Other: N/A CT CERVICAL SPINE FINDINGS Alignment: Normal Skull base and vertebrae: Vertebral body heights maintained. Scattered disc space narrowing and small endplate spurs greatest at C5-C6. No fracture or bone destruction. Osseous mineralization normal. Skull base intact. Soft  tissues and spinal canal: Prevertebral soft tissues normal thickness. Regional cervical soft tissues unremarkable Disc levels:  No additional abnormalities Upper chest: Lung apices clear Other: N/A IMPRESSION: No acute intracranial abnormalities. 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 No acute cervical spine  abnormalities. Electronically Signed   By: Ulyses SouthwardMark  Boles M.D.   On: 06/12/2019 16:13   Ct Cervical Spine Wo Contrast  Result Date: 06/12/2019 CLINICAL DATA:  Unwitnessed syncopal episode 3 days, with standing at the foot of the bed packing of bag when she fell, awoke on floor, unknown duration of loss of consciousness, some confusion the next day, slipped and fell yesterday striking head in bathroom, neck pain, facial soreness and bruising EXAM: CT HEAD WITHOUT CONTRAST CT CERVICAL SPINE WITHOUT CONTRAST TECHNIQUE: Multidetector CT imaging of the head and cervical spine was performed following the standard protocol without intravenous contrast. Multiplanar CT image reconstructions of the cervical spine were also generated. COMPARISON:  CT head 01/08/2011 FINDINGS: CT HEAD FINDINGS Brain: Normal ventricular morphology. No midline shift or mass effect. Streak artifacts at skull base and temporal lobes. Otherwise normal appearance of brain parenchyma. No definite intracranial hemorrhage, mass lesion or evidence of acute infarction. No extra-axial fluid collections. Vascular: No hyperdense vessels Skull: Intact Sinuses/Orbits: Clear Other: N/A CT CERVICAL SPINE FINDINGS Alignment: Normal Skull base and vertebrae: Vertebral body heights maintained. Scattered disc space narrowing and small endplate spurs greatest at C5-C6. No fracture or bone destruction. Osseous mineralization normal. Skull base intact. Soft tissues and spinal canal: Prevertebral soft tissues normal thickness. Regional cervical soft tissues unremarkable Disc levels:  No additional abnormalities Upper chest: Lung  apices clear Other: N/A IMPRESSION: No acute intracranial abnormalities. 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 No acute cervical spine abnormalities. Electronically Signed   By: Ulyses SouthwardMark  Boles M.D.   On: 06/12/2019 16:13   Ct Abdomen Pelvis W Contrast  Result Date: 07/06/2019 CLINICAL DATA:  Epigastric abdominal pain EXAM: CT ABDOMEN AND PELVIS WITH CONTRAST TECHNIQUE: Multidetector CT imaging of the abdomen and pelvis was performed using the standard protocol following bolus administration of intravenous contrast. CONTRAST:  75mL OMNIPAQUE IOHEXOL 300 MG/ML  SOLN COMPARISON:  01/13/2019 FINDINGS: LOWER CHEST: There is no basilar pleural or apical pericardial effusion. HEPATOBILIARY: The hepatic contours and density are normal. There is no intra- or extrahepatic biliary dilatation. Status post cholecystectomy. PANCREAS: The pancreatic parenchymal contours are normal and there is no ductal dilatation. There is no peripancreatic fluid collection. SPLEEN: Normal. ADRENALS/URINARY TRACT: --Adrenal glands: Normal. --Right kidney/ureter: No hydronephrosis, nephroureterolithiasis, perinephric stranding or solid renal mass. --Left kidney/ureter: No hydronephrosis, nephroureterolithiasis, perinephric stranding or solid renal mass. --Urinary bladder: Normal for degree of distention STOMACH/BOWEL: --Stomach/Duodenum: There is no hiatal hernia or other gastric abnormality. The duodenal course and caliber are normal. --Small bowel: Multiple areas of small bowel narrowing but no dilatation or inflammation. --Colon: No takes focal abnormality. --Appendix: Surgically absent. VASCULAR/LYMPHATIC: Normal course and caliber of the major abdominal vessels. No abdominal or pelvic lymphadenopathy. REPRODUCTIVE: No free fluid in the pelvis. MUSCULOSKELETAL. No bony spinal canal stenosis or focal osseous abnormality. OTHER: None. IMPRESSION: No acute abdominal or pelvic abnormality. There are multiple  areas of small bowel narrowing, but no dilatation or inflammation. Electronically Signed   By: Deatra RobinsonKevin  Herman M.D.   On: 07/06/2019 03:24   Dg Chest Port 1 View  Result Date: 07/07/2019 CLINICAL DATA:  Hypoxia. EXAM: PORTABLE CHEST 1 VIEW COMPARISON:  07/05/2019. FINDINGS: Mediastinum hilar structures normal. Heart size normal. Bibasilar atelectasis. Bibasilar infiltrates consistent with pneumonia. No pleural effusion or pneumothorax. IMPRESSION: Bibasilar atelectasis. Bibasilar infiltrates consistent with pneumonia. Followup PA and lateral chest X-ray is recommended in 3-4 weeks following trial of antibiotic therapy to ensure resolution and exclude underlying malignancy. Electronically Signed  ByMaisie Fus  Register   On: 07/07/2019 07:44   US Abdomen Limited Ruq  Result Date: 07/06/2019 CLINICAL DATA:  Epigastric abdominal pain. Previous cholecystectomy. Status post laparoscopic appendectomy on 01/08/2019. EXAM: ULTRASOUND ABDOMEN LIMITED RIGHT UPPER QUADRANT COMPARISON:  Abdomen and pelvis CT obtained earlier today. FINDINGS: Gallbladder: Surgically absent. Common bile duct: Diameter: 3.8 mm Liver: No focal lesion identified. Within normal limits in parenchymal echogenicity. Tiny amount of fluid adjacent to the dome of the liver on the right. Portal vein is patent on color Doppler imaging with normal direction of blood flow towards the liver. IMPRESSION: 1. Tiny amount of fluid adjacent to the dome of the liver on the right. This has an appearance suggesting minimal loculated ascites or a very small residual fluid collection from previous surgery. 2. Status post cholecystectomy. 3. No biliary dilatation. Electronically Signed   By: Beckie Salts M.D.   On: 07/06/2019 09:32    Micro Results   Recent Results (from the past 240 hour(s))  SARS Coronavirus 2 (CEPHEID - Performed in [facility] hospital lab), Hosp Order     Status: None   Collection Time: 07/06/19 12:37 PM   Specimen: Nasopharyngeal Swab    Result Value Ref Range Status   SARS Coronavirus 2 NEGATIVE NEGATIVE Final    Comment: (NOTE) If result is NEGATIVE SARS-CoV-2 target nucleic acids are NOT DETECTED. The SARS-CoV-2 RNA is generally detectable in upper and lower  respiratory specimens during the acute phase of infection. The lowest  concentration of SARS-CoV-2 viral copies this assay can detect is 250  copies / mL. A negative result does not preclude SARS-CoV-2 infection  and should not be used as the sole basis for treatment or other  patient management decisions.  A negative result may occur with  improper specimen collection / handling, submission of specimen other  than nasopharyngeal swab, presence of viral mutation(s) within the  areas targeted by this assay, and inadequate number of viral copies  (<250 copies / mL). A negative result must be combined with clinical  observations, patient history, and epidemiological information. If result is POSITIVE SARS-CoV-2 target nucleic acids are DETECTED. The SARS-CoV-2 RNA is generally detectable in upper and lower  respiratory specimens dur ing the acute phase of infection.  Positive  results are indicative of active infection with SARS-CoV-2.  Clinical  correlation with patient history and other diagnostic information is  necessary to determine patient infection status.  Positive results do  not rule out bacterial infection or co-infection with other viruses. If result is PRESUMPTIVE POSTIVE SARS-CoV-2 nucleic acids MAY BE PRESENT.   A presumptive positive result was obtained on the submitted specimen  and confirmed on repeat testing.  While 2019 novel coronavirus  (SARS-CoV-2) nucleic acids may be present in the submitted sample  additional confirmatory testing may be necessary for epidemiological  and / or clinical management purposes  to differentiate between  SARS-CoV-2 and other Sarbecovirus currently known to infect humans.  If clinically indicated additional  testing with an alternate test  methodology [phone]) is advised. The SARS-CoV-2 RNA is generally  detectable in upper and lower respiratory sp ecimens during the acute  phase of infection. The expected result is Negative. Fact Sheet for Patients:  BoilerBrush.com.cy Fact Sheet for Healthcare Providers: https://pope.com/ This test is not yet approved or cleared by the [place] FDA and has been authorized for detection and/or diagnosis of SARS-CoV-2 by FDA under an Emergency Use Authorization (EUA).  This EUA will remain in effect (meaning  this test can be used) for the duration of the COVID-19 declaration under Section 564(b)(1) of the Act, 21 U.S.C. section 360bbb-3(b)(1), unless the authorization is terminated or revoked sooner. Performed at [facility], [address] [place], [place] [address]   Culture, blood (Routine X 2) w Reflex to ID Panel     Status: None (Preliminary result)   Collection Time: 07/07/19  2:53 PM   Specimen: BLOOD RIGHT HAND  Result Value Ref Range Status   Specimen Description BLOOD RIGHT HAND  Final   Special Requests   Final    BOTTLES DRAWN AEROBIC AND ANAEROBIC Blood Culture adequate volume   Culture   Final    NO GROWTH < 24 HOURS Performed at [facility], [address] [place], [place] [address]    Report Status PENDING  Incomplete  Culture, blood (Routine X 2) w Reflex to ID Panel     Status: None (Preliminary result)   Collection Time: 07/07/19  2:53 PM   Specimen: BLOOD RIGHT HAND  Result Value Ref Range Status   Specimen Description BLOOD RIGHT HAND  Final   Special Requests   Final    BOTTLES DRAWN AEROBIC AND ANAEROBIC Blood Culture adequate volume   Culture   Final    NO GROWTH < 24 HOURS Performed at [facility], [address] [place], [place] [address]    Report Status PENDING  Incomplete       Today   Subjective    [name] today has no new  complaints, Ambulating in room without further hypoxia or dyspnea on exertion, cough is improved, --- Repeatedly asking for benzos and opiates          Patient has been seen and examined prior to discharge   Objective   Blood pressure 136/77, pulse (!) 55, temperature 98.1 F (36.7 C), temperature source Oral, resp. rate 17, height 5\' 1"  (1.549 m), weight 47.2 kg, SpO2 93 %.   Intake/Output Summary (Last 24 hours) at 07/08/2019 1438 Last data filed at 07/08/2019 1100 Gross per 24 hour  Intake 6972.5 ml  Output --  Net 6972.5 ml    Exam Gen:- Awake Alert, no acute distress, speaking in complete sentences HEENT:- [place].AT, No sclera icterus Neck-Supple Neck,No JVD,.  Lungs-improved air movement, few scattered rhonchi, no wheezing  CV- S1, S2 normal, regular Abd-  +ve B.Sounds, Abd Soft, No tenderness,    Extremity/Skin:- No  edema,   good pulses Psych-affect is appropriate, oriented x3 Neuro-no new focal deficits, no tremors    Data Review   CBC w Diff:  Lab Results  Component Value Date   WBC 11.1 (H) 07/08/2019   HGB 13.0 07/08/2019   HCT 40.8 07/08/2019   PLT 158 07/08/2019   LYMPHOPCT 35 06/12/2019   MONOPCT 8 06/12/2019   EOSPCT 4 06/12/2019   BASOPCT 1 06/12/2019    CMP:  Lab Results  Component Value Date   NA 141 07/08/2019   K 3.6 07/08/2019   CL 103 07/08/2019   CO2 31 07/08/2019   BUN 11 07/08/2019   CREATININE 0.55 07/08/2019   PROT 5.8 (L) 07/08/2019   ALBUMIN 3.2 (L) 07/08/2019   BILITOT 0.5 07/08/2019   ALKPHOS 121 07/08/2019   AST 68 (H) 07/08/2019   ALT 116 (H) 07/08/2019  .   Total Discharge time is about 33 minutes  Shon Haleourage Keerthi Hazell M.D on 07/08/2019 at 2:38 PM  Go to [email or web address] -  for contact info  [facility] - Office  [phone] ° ° °

## 2019-07-08 NOTE — Progress Notes (Signed)
IVs removed patient tolerated well, 2x2 gauze and paper tape applied to site.  Reviewed AVS with patient, who verbalized understanding and all questions answered. Patient awaiting husbands arrival to transport home.  Patient to be taken to short stay lobby via wheelchair.

## 2019-07-12 LAB — CULTURE, BLOOD (ROUTINE X 2)
Culture: NO GROWTH
Culture: NO GROWTH
Special Requests: ADEQUATE
Special Requests: ADEQUATE

## 2019-09-27 DIAGNOSIS — M25562 Pain in left knee: Secondary | ICD-10-CM | POA: Insufficient documentation

## 2019-09-27 DIAGNOSIS — G8929 Other chronic pain: Secondary | ICD-10-CM | POA: Insufficient documentation

## 2019-10-05 DIAGNOSIS — F419 Anxiety disorder, unspecified: Secondary | ICD-10-CM | POA: Insufficient documentation

## 2019-10-05 DIAGNOSIS — F4312 Post-traumatic stress disorder, chronic: Secondary | ICD-10-CM | POA: Insufficient documentation

## 2019-10-17 DIAGNOSIS — G9589 Other specified diseases of spinal cord: Secondary | ICD-10-CM | POA: Insufficient documentation

## 2020-05-09 ENCOUNTER — Telehealth: Payer: Self-pay | Admitting: Primary Care

## 2020-05-09 NOTE — Telephone Encounter (Signed)
Called patient's home number to schedule the Palliative Consult, no answer - left message with reason for call along with my name and contact number.  I also called husband's cell, no answer, unable to leave message due to mailbox was full  I then called patient's cell and it went straight to voicemail.

## 2020-05-17 ENCOUNTER — Telehealth: Payer: Self-pay | Admitting: Primary Care

## 2020-05-22 NOTE — Telephone Encounter (Signed)
Spoke with patient regarding Palliative services and all questions were answered and she was she in agreement with starting services.    At patient's request, an In-person Consult was scheduled for 06/26/20 @ 3 PM.

## 2020-06-01 ENCOUNTER — Telehealth: Payer: Self-pay

## 2020-06-01 NOTE — Telephone Encounter (Addendum)
Received update from [facility] with Palliative care that patient would like a call. Patient has some questions about pain and advanced directives. Patient shared past medical history, dx of myelomalacia that she feels progression and having unmanaged pain.Patient became tearful throughout conversation. Active listening and support offered. Telphone visit scheduled for 06/02/20 @ 2pm.

## 2020-06-02 ENCOUNTER — Telehealth: Payer: Self-pay | Admitting: Primary Care

## 2020-06-02 ENCOUNTER — Other Ambulatory Visit: Payer: Self-pay

## 2020-06-02 ENCOUNTER — Other Ambulatory Visit: Payer: Self-pay | Admitting: Primary Care

## 2020-06-02 DIAGNOSIS — J438 Other emphysema: Secondary | ICD-10-CM

## 2020-06-02 DIAGNOSIS — Z515 Encounter for palliative care: Secondary | ICD-10-CM

## 2020-06-02 NOTE — Progress Notes (Signed)
[place] Consult Note Telephone: [phone]  Fax: [phone]  PATIENT NAME: [name] [address] [place] [address] [phone] (home)  DOB: Mar 06, 1956 MRN: [id number]  PRIMARY CARE PROVIDER:    Vidal Schwalbe, MD,  [address] [place] [address] [phone]  REFERRING PROVIDER:   Vidal Schwalbe, MD [address] [place],  [place] [address] [phone]  RESPONSIBLE PARTY:   Extended Emergency Contact Information Primary Emergency Contact: Stief,Max Address: [address] [place], [place] Phone: [phone] Mobile Phone: [phone] Relation: Spouse  I met with patient  By telephone interview. TELEHEALTH VISIT STATEMENT Due to the COVID-19 crisis, this visit was done via telemedicine from my office. It was initiated and consented to by this patient and/or family.  ASSESSMENT AND RECOMMENDATIONS:   1. Advance Care Planning/Goals of Care: Goals include to maximize quality of life and symptom management. Our advance care planning conversation included a discussion about:     The value and importance of advance care planning   Exploration of personal, cultural or spiritual beliefs that might influence medical decisions   Identification of a healthcare agent   Review and need of an  advance directive document. We will discuss and sign a MOST in our in person visit in 3 weeks. We also discussed her desire for hospice admission once she is eligible.   2. Symptom Management:   Lengthy discussion of patient history and current status of myelomalacia, transverse myelitis. Patient outlined her goals for care to maximize her quality of life, and has been told there are no more treatments for her.This is a progressive neurological illness with function loss and pain. Her concerns are advance directives and pain management. She was a hospice nurse  formerly.  Pain management: Listened to concerns for pain control. Gabapentin is not working well any more. She was referred to a pain management clinic, and I have encouraged her to keep this appointment. I let her know that prescribing chronic pain management controlled substances was outside the scope of our medical practice and she would be well served by being followed in a pain management clinic.  Services: States she needs additional care services, and is known to her county DSS department. I encouraged her to reach out to her DSS social worker and see if she could apply for CAP or PCS services. There may be other services available through the county dept on aging, [place], etc. I would like to consult with our palliative SW on our next visit with patient, either as a joint visit in person or video.  Disease process: Have reviewed [facility] chart and put call in to neurology for clarification of disease stage. Patient outlined its progressive nature but current stage is unclear.   3. Family /Caregiver/Community Supports:  Lives with husband, has some care support but may need more. Will explore available resources in future visits.  4. Cognitive / Functional decline: A and O x 3, knowledgeable about disease and terminal process. Reports unable to participate in many adls, and endorses increasing weakness.   5. Follow up Palliative Care Visit: Palliative care will continue to follow for goals of care clarification and symptom management. Return 3-4 weeks or prn.   I spent 60 minutes providing this consultation,  from 1400 to 1500. More than 50% of the time in this consultation was spent coordinating communication.  HISTORY OF PRESENT ILLNESS:  [name] is a 64 y.o. 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 This is the initial  visit.  CODE STATUS: TBD  PPS: 40%  HOSPICE ELIGIBILITY/DIAGNOSIS: TBD  PAST MEDICAL HISTORY:  Past Medical History:  Diagnosis Date   Chronic pain    gneralized denies fibromyalgia   COPD (chronic obstructive pulmonary disease) (HCC)    Depression    Diverticulosis    IBS (irritable bowel syndrome)    Panic disorder    Panic disorder    PTSD (post-traumatic stress disorder)    PTSD (post-traumatic stress disorder) 01/08/2019   Temporomandibular joint disorder (TMJ)    age 77 fractured TMJ - repaired by oral surgeon    SOCIAL HX:  Social History   Tobacco Use   Smoking status: Current Every Day Smoker    Packs/day: 0.50    Types: Cigarettes   Smokeless tobacco: Never Used  Substance Use Topics   Alcohol use: No    ALLERGIES:  Allergies  Allergen Reactions   Nsaids     Advised not to take    Toradol [Ketorolac Tromethamine] Nausea And Vomiting and Swelling    TONGUE SWELLING   Tramadol Nausea And Vomiting   Benadryl [Diphenhydramine Hcl] Palpitations    Insomnia   Ceftin [Cefuroxime Axetil] Rash   Compazine Palpitations    Insomnia   Epinephrine Palpitations    Insomina     PERTINENT MEDICATIONS:  Outpatient Encounter Medications as of 06/02/2020  Medication Sig   albuterol (PROVENTIL HFA;VENTOLIN HFA) 108 (90 Base) MCG/ACT inhaler Inhale 1-2 puffs into the lungs every 6 (six) hours as needed for wheezing or shortness of breath.   albuterol (PROVENTIL) (2.5 MG/3ML) 0.083% nebulizer solution Take 3 mLs (2.5 mg total) by nebulization every 4 (four) hours as needed for wheezing or shortness of breath.   Azelastine HCl 137 MCG/SPRAY SOLN Place 1 spray into the nose daily as needed (for nasal congestion).    baclofen (LIORESAL) 10 MG tablet Take 0.5 tablets (5 mg total) by mouth 2 (two) times daily.    budesonide-formoterol (SYMBICORT) 160-4.5 MCG/ACT inhaler Inhale 2 puffs into the lungs 2 (two) times daily.   clonazePAM (KLONOPIN) 1 MG tablet Take 0.5 tablets (0.5 mg total) by mouth 2 (two) times daily as needed for anxiety. *May take one additional tablet as needed for anxiety/PTSD   ezetimibe (ZETIA) 10 MG tablet Take 10 mg by mouth daily.   fluticasone (FLONASE) 50 MCG/ACT nasal spray Place 2 sprays into both nostrils daily.   Gabapentin Enacarbil (HORIZANT) 600 MG TBCR Take 1 tablet by mouth at bedtime as needed (FOR PAIN/NEUROPATHY).    guaiFENesin (MUCINEX) 600 MG 12 hr tablet Take 1 tablet (600 mg total) by mouth 2 (two) times daily.   ipratropium (ATROVENT) 0.03 % nasal spray Place 1 spray into both nostrils daily as needed for rhinitis.    Multiple Vitamins-Minerals (CENTRUM SILVER PO) Take 1 tablet by mouth daily.   MURO 128 2 % ophthalmic solution Place 1 drop into both eyes 2 (two) times daily.   MURO 128 5 % ophthalmic ointment Place 1 drop into both eyes at bedtime.   ranitidine (ZANTAC) 300 MG tablet  Take 1 tablet (300 mg total) by mouth at bedtime. For stomach   No facility-administered encounter medications on file as of 06/02/2020.    PHYSICAL EXAM / ROS:   deferred Jason Coop, NP [facility]

## 2020-06-02 NOTE — Telephone Encounter (Signed)
T/C to Dr. Bryna Colander to clarify referral as it has been documented in several previous notes she was released from the [facility]. I spoke with MD office who states she was not released, that she has chosen to move her provider to another practice; to where they do not know. Dr. Bryna Colander did send referral to [facility]. I also observe patient has been referred to pain management. I will send my note to Dr Bryna Colander after my visit today per telemedicine.

## 2020-06-26 ENCOUNTER — Other Ambulatory Visit: Payer: Self-pay

## 2020-06-26 ENCOUNTER — Other Ambulatory Visit: Payer: Self-pay | Admitting: Primary Care

## 2020-06-27 ENCOUNTER — Telehealth: Payer: Self-pay | Admitting: Primary Care

## 2020-06-27 NOTE — Telephone Encounter (Signed)
Rec'd return cal from patient and have rescheduled the Palliative Consult for 07/04/20 @ 11 AM.

## 2020-06-27 NOTE — Telephone Encounter (Signed)
Patient left me a message yesterday needing to reschedule the Initial Palliative Consult scheduled for 06/26/20, she requested I call her back to day to reschedule.  Called patient with no answer - left message requesting a return call to schedule appointment.

## 2020-07-04 ENCOUNTER — Encounter: Payer: Self-pay | Admitting: Primary Care

## 2020-07-04 ENCOUNTER — Other Ambulatory Visit: Payer: Self-pay

## 2020-07-04 ENCOUNTER — Other Ambulatory Visit: Payer: Self-pay | Admitting: Primary Care

## 2020-07-04 DIAGNOSIS — Z515 Encounter for palliative care: Secondary | ICD-10-CM

## 2020-07-04 DIAGNOSIS — J438 Other emphysema: Secondary | ICD-10-CM

## 2020-07-04 NOTE — Progress Notes (Signed)
[place] Consult Note Telephone: [phone]  Fax: [phone]  PATIENT NAME: [name] [address] [place] [address] [phone] (home)  DOB: [birth year] MRN: [id number]  PRIMARY CARE PROVIDER:    Vidal Schwalbe, MD,  [address] [place] [address] [phone]  REFERRING PROVIDER:   Vidal Schwalbe, MD [address] [place],  [place] [address] [phone]  RESPONSIBLE PARTY:   Extended Emergency Contact Information Primary Emergency Contact: Dault,Max Address: [address] [place], [place] Phone: [phone] Mobile Phone: [phone] Relation: Spouse  I met face to face with patient and family in the home. I met with [name] in her home with her husband present. She outlined her diagnosis of myolacia. She stated that her mother had many auto immune issues.   ASSESSMENT AND RECOMMENDATIONS:   1. Advance Care Planning/Goals of Care: Goals include to maximize quality of life and symptom management. Our advance care planning conversation included a discussion about:     The value and importance of advance care planning   Exploration of personal, cultural or spiritual beliefs that might influence medical decisions   Exploration of goals of care in the event of a sudden injury or illness   Identification and preparation of a healthcare agent   Review/creation of an  advance directive document .  Pt Worked as a Marine scientist in a hospice facility. States she wants limited interventions but wants to have some time to think. Does state she wants DNR. Left MOST for her to review.  Goals of care we discussed her disease course. She would like to speak with her TM team and discuss what she can expect in regards to debility. She stated she wanted to have a DNR which I left in the house. We discussed the most form and other choices which she will review but generally  said she did not want heroics but would want to treat things that were going to resolve fairly easily. We discussed her availing herself of services of the TM clinic including counseling and pharmacy.   2. Symptom Management:.   Mobility: She would like to have PT and OT therapy which she helps to help her gain strength or at least give her some adaptative tools for ADLs. I would recommend Home Health although [place] and [place] a payer source it  may be difficult to find a company. She could probably do some outpatient under charity care at [facility].  During our interview she remain seated stated that her ambulation is difficult as she cannot use a cane or a walker due to her hand weakness  Caregiving strain. We discussed her needs for help with ADLs especially as her extremities become weak and she has periods of not knowing if she can stand up for long. She states she and her husband may sell their home to move closer in to a town for services. We discussed payer sources for caregiving. At this point she does not have a payer source. I encouraged her to talk to Social Security about disability and also to call her county DSS to inquire about Medicaid eligibility. There  is some property that they own and we discussed the possibility of selling some of this for care which they said was their plan. I also suggested a service that  could make home visits as  she is becoming more debilitated and unable to get outside the home.   Smoking cessation: Discusesd 50 pack year history. May be ready for cessation. States she stopped for a year once . States her excuses are not valid. We discussed ways she could cope with anxiety and stop smoking, and for improvements to help she might see.   Debility: Has myelacia and not operable.  Seen in transverse myelitis center..   Depression: Not interested in medication, States she'd tried them all and none work. Suggested LCSW at TM center for  therapy  3. Family /Caregiver/Community Supports: [place].Limited supports. Charity care at [facility], no payor sources for additional help.  4. Cognitive / Functional decline:  A and O x 3, endorses growing weakness and wants to know disease course.  5. Follow up Palliative Care Visit: Palliative care will continue to follow for goals of care clarification and symptom management. Return 4 weeks or prn. I will visit again in one month and answer any more questions about her advance directives at that time.  I spent 120 minutes providing this consultation,  from 1430 to 1630. More than 50% of the time in this consultation was spent coordinating communication.   CHIEF COMPLAINT: Weakness, parathesias   HISTORY OF PRESENT ILLNESS:  [name] is a 64 y.o. year old female with multiple medical problems including transverse myelitis, tobacco abuse, depression, PTSD . Palliative Care was asked to follow this patient by consultation request of Vidal Schwalbe, MD to help address advance care planning and goals of care. This is the initial  visit.  CODE STATUS: DNR  PPS: 40%  HOSPICE ELIGIBILITY/DIAGNOSIS: TBD  PAST MEDICAL HISTORY:  Past Medical History:  Diagnosis Date  . Chronic pain    gneralized denies fibromyalgia  . COPD (chronic obstructive pulmonary disease) ([facility])   . Depression   . Diverticulosis   . IBS (irritable bowel syndrome)   . Panic disorder   . Panic disorder   . PTSD (post-traumatic stress disorder)   . PTSD (post-traumatic stress disorder) 01/08/2019  . Temporomandibular joint disorder (TMJ)    age 78 fractured TMJ - repaired by oral surgeon    SOCIAL HX:  Social History   Tobacco Use  . Smoking status: Current Every Day Smoker    Packs/day: 0.50    Types: Cigarettes  . Smokeless tobacco: Never Used  Substance Use Topics  . Alcohol use: No   FAMILY HX:  Family History  Problem Relation Age of Onset  . Polymyalgia  rheumatica Mother   . Migraines Mother   . Coronary artery disease Father   . Breast cancer Neg Hx      ALLERGIES:  Allergies  Allergen Reactions  . Nsaids     Advised not to take   . Toradol [Ketorolac Tromethamine] Nausea And Vomiting and Swelling    TONGUE SWELLING  . Tramadol Nausea And Vomiting  . Benadryl [Diphenhydramine Hcl] Palpitations    Insomnia  . Ceftin [Cefuroxime Axetil] Rash  . Compazine Palpitations    Insomnia  . Epinephrine Palpitations    Insomina     PERTINENT MEDICATIONS:  Outpatient Encounter Medications as of 07/04/2020  Medication Sig  . albuterol (PROVENTIL HFA;VENTOLIN HFA) 108 (90 Base) MCG/ACT inhaler Inhale 1-2 puffs into the lungs every 6 (six) hours as needed for wheezing or shortness of breath.  Marland Kitchen albuterol (PROVENTIL) (2.5 MG/3ML) 0.083% nebulizer solution Take 3 mLs (2.5 mg total) by nebulization every 4 (four) hours  as needed for wheezing or shortness of breath.  . Azelastine HCl 137 MCG/SPRAY SOLN Place 1 spray into the nose daily as needed (for nasal congestion).   . baclofen (LIORESAL) 10 MG tablet Take 0.5 tablets (5 mg total) by mouth 2 (two) times daily. (Patient taking differently: Take 10 mg by mouth 3 (three) times daily. )  . budesonide-formoterol (SYMBICORT) 160-4.5 MCG/ACT inhaler Inhale 2 puffs into the lungs 2 (two) times daily.  . clonazePAM (KLONOPIN) 1 MG tablet Take 0.5 tablets (0.5 mg total) by mouth 2 (two) times daily as needed for anxiety. *May take one additional tablet as needed for anxiety/PTSD (Patient taking differently: Take 1 mg by mouth 4 (four) times daily as needed for anxiety. *May take one additional tablet as needed for anxiety/PTSD)  . Gabapentin Enacarbil (HORIZANT) 600 MG TBCR Take 1 tablet by mouth at bedtime as needed (FOR PAIN/NEUROPATHY).   Marland Kitchen guaiFENesin (MUCINEX) 600 MG 12 hr tablet Take 1 tablet (600 mg total) by mouth 2 (two) times daily.  Marland Kitchen ipratropium (ATROVENT) 0.03 % nasal spray Place 1 spray into  both nostrils daily as needed for rhinitis.   . Multiple Vitamins-Minerals (CENTRUM SILVER PO) Take 1 tablet by mouth daily.  Marland Kitchen MURO 128 2 % ophthalmic solution Place 1 drop into both eyes 2 (two) times daily.  Marland Kitchen MURO 128 5 % ophthalmic ointment Place 1 drop into both eyes at bedtime.  . fluticasone (FLONASE) 50 MCG/ACT nasal spray Place 2 sprays into both nostrils daily. (Patient not taking: Reported on 07/04/2020)  . ranitidine (ZANTAC) 300 MG tablet Take 1 tablet (300 mg total) by mouth at bedtime. For stomach (Patient not taking: Reported on 07/04/2020)  . [DISCONTINUED] ezetimibe (ZETIA) 10 MG tablet Take 10 mg by mouth daily. (Patient not taking: Reported on 07/04/2020)   No facility-administered encounter medications on file as of 07/04/2020.   PHYSICAL EXAM / ROS:   Current and past weights: reported 88 lbs  General: NAD, frail appearing, thin  HEENT Blind in R eye, lids intact. Disconjugate gaze.  Cardiovascular: no chest pain reported, no edema  Pulmonary: ++ cough, no increased SOB, room air, 0.5 PPD now, smoked x 50 years. (50-60 PACK YEARS) Abdomen: appetite fair to poor,  endorses constipation, continent of bowel GU: denies dysuria, continent of urine MSK:  +  joint and ROM abnormalities, ambulatory with device and standby assist.  Skin: no rashes or wounds reported Neurological: increasing weakness, endorse paraesthesia in hands, strength and sensation, endorses severe cramps in arms, hands  and legs, feet and toes.  Endorses some dizziness, H/o migraine  Jason Coop, NP , DNP, MPH, [facility]  COVID-19 PATIENT SCREENING TOOL  Person answering questions: ____________Teresa_______ _____   1.  Is the patient or any family member in the home showing any signs or symptoms regarding respiratory infection?               Person with Symptom- __________NA_________________  a. Fever                                                                          Yes___ No___           ___________________  b. Shortness of breath  Yes___ No___          ___________________ c. Cough/congestion                                       Yes___  No___         ___________________ d. Body aches/pains                                                         Yes___ No___        ____________________ e. Gastrointestinal symptoms (diarrhea, nausea)           Yes___ No___        ____________________  2. Within the past 14 days, has anyone living in the home had any contact with someone with or under investigation for COVID-19?    Yes___ No_X_   Person __________________   

## 2020-07-17 DIAGNOSIS — G373 Acute transverse myelitis in demyelinating disease of central nervous system: Secondary | ICD-10-CM | POA: Insufficient documentation

## 2020-08-04 ENCOUNTER — Telehealth: Payer: Self-pay | Admitting: Primary Care

## 2020-08-04 NOTE — Telephone Encounter (Signed)
T/c call to remind of Monday AM visit. Message left.

## 2020-08-07 ENCOUNTER — Encounter: Payer: Self-pay | Admitting: Primary Care

## 2020-08-07 ENCOUNTER — Other Ambulatory Visit: Payer: Self-pay

## 2020-08-07 ENCOUNTER — Other Ambulatory Visit: Payer: Self-pay | Admitting: Primary Care

## 2020-08-07 DIAGNOSIS — Z515 Encounter for palliative care: Secondary | ICD-10-CM

## 2020-08-07 DIAGNOSIS — J439 Emphysema, unspecified: Secondary | ICD-10-CM

## 2020-08-07 NOTE — Progress Notes (Signed)
[facility] Consult Note Telephone: [phone]  Fax: [phone]  PATIENT NAME: [name] [address] [place] [address] [phone] (home)  DOB: 1956-08-12 MRN: [id number]  PRIMARY CARE PROVIDER:    Vidal Schwalbe, MD,  [address] [place] [address] [phone]  REFERRING PROVIDER:   Vidal Schwalbe, MD [address] [place],  [place] [address] [phone]  RESPONSIBLE PARTY:   Extended Emergency Contact Information Primary Emergency Contact: Delvecchio,Max Address: [address] [place], [place] Phone: [phone] Mobile Phone: [phone] Relation: Spouse  I met face to face with patient and family in home. Husband was in and out of interview as well.   ASSESSMENT AND RECOMMENDATIONS:   1. Advance Care Planning/Goals of Care: Goals include to maximize quality of life and symptom management.   DNR has been done, but has not done MOST form yet, will work on for next visit. She was worried about her recent procedures; we discussed it would be better to discuss at a non pressured time.  Discussed her decisions re: care   And fear of needing higher level of care. Discussed possible sale of land and moving closer to care provision.   2. Symptom Management:   Pain management: Discussed pain management, with current protocol and pt is being seen by pain management.  Has long time use of klonopin, and says anti depressants don't work. She did stop smoking on Friday and has on a nicotine patch. We discussed acupuncture as well.    Naltrexone was also offered. They did not discuss methadone. She is being referred to psych pain. We discussed anti depressants and also alternative modalities such as biofeedback. We discussed her being willing to try new modalities. She also must be drug tested.   Disease process: Sept-Transverse myelitis clinic  and and Oct has neuro and  pain management appts.    Smoking cessation: Has started with patch, stopped smoking 72 hours ago. We discussed the advantage of using patches and cessation.    Case coordination: discussed Medicaid, SSI, marketplace insurance. Has pain management and psych follow up, and  TM clinic as well. Discussed calling DSS for SW case management.   3. Follow up Palliative Care Visit: Palliative care will continue to follow for goals of care clarification and symptom management. Return 8 weeks or prn.  4. Family /Caregiver/Community Supports:  Lives with husband in their own home in remote area.  5. Cognitive / Functional decline:  A and O x 3,  Able to ambulate (I), does many adls on own.   I spent 110 minutes providing this consultation,  from 1200 to 1350. More than 50% of the time in this consultation was spent coordinating communication.   CHIEF COMPLAINT: Pain management, coordination of care  HISTORY OF PRESENT ILLNESS:  [name] is a 64 y.o. year old female with multiple medical problems including transverse myelitis, tobacco abuse, depression, PTSD. Palliative Care was asked to follow this patient by consultation request of Vidal Schwalbe, MD to help address advance care planning and goals of care. This is a follow up visit.  CODE STATUS:  DNR  PPS: 50%  HOSPICE ELIGIBILITY/DIAGNOSIS: no  PAST MEDICAL HISTORY:  Past Medical History:  Diagnosis Date  . Chronic pain    gneralized denies fibromyalgia  . COPD (chronic obstructive pulmonary disease) ([facility])   . Depression   .  Diverticulosis   . IBS (irritable bowel syndrome)   . Panic disorder   . Panic disorder   . PTSD (post-traumatic stress disorder)   . PTSD (post-traumatic stress disorder) 01/08/2019  . Temporomandibular joint disorder (TMJ)    age 64 fractured TMJ - repaired by oral surgeon    SOCIAL HX:  Social History   Tobacco Use  . Smoking status: Current Every Day Smoker    Packs/day: 0.50    Types: Cigarettes    . Smokeless tobacco: Never Used  Substance Use Topics  . Alcohol use: No   FAMILY HX:  Family History  Problem Relation Age of Onset  . Polymyalgia rheumatica Mother   . Migraines Mother   . Coronary artery disease Father   . Breast cancer Neg Hx     ALLERGIES:  Allergies  Allergen Reactions  . Nsaids     Advised not to take   . Toradol [Ketorolac Tromethamine] Nausea And Vomiting and Swelling    TONGUE SWELLING  . Tramadol Nausea And Vomiting  . Benadryl [Diphenhydramine Hcl] Palpitations    Insomnia  . Ceftin [Cefuroxime Axetil] Rash  . Compazine Palpitations    Insomnia  . Epinephrine Palpitations    Insomina     PERTINENT MEDICATIONS:  Outpatient Encounter Medications as of 08/07/2020  Medication Sig  . albuterol (PROVENTIL HFA;VENTOLIN HFA) 108 (90 Base) MCG/ACT inhaler Inhale 1-2 puffs into the lungs every 6 (six) hours as needed for wheezing or shortness of breath.  Marland Kitchen albuterol (PROVENTIL) (2.5 MG/3ML) 0.083% nebulizer solution Take 3 mLs (2.5 mg total) by nebulization every 4 (four) hours as needed for wheezing or shortness of breath.  . Azelastine HCl 137 MCG/SPRAY SOLN Place 1 spray into the nose daily as needed (for nasal congestion).   . baclofen (LIORESAL) 10 MG tablet Take 0.5 tablets (5 mg total) by mouth 2 (two) times daily. (Patient taking differently: Take 10 mg by mouth 3 (three) times daily. )  . budesonide-formoterol (SYMBICORT) 160-4.5 MCG/ACT inhaler Inhale 2 puffs into the lungs 2 (two) times daily.  . clonazePAM (KLONOPIN) 1 MG tablet Take 0.5 tablets (0.5 mg total) by mouth 2 (two) times daily as needed for anxiety. *May take one additional tablet as needed for anxiety/PTSD (Patient taking differently: Take 1 mg by mouth 4 (four) times daily as needed for anxiety. *May take one additional tablet as needed for anxiety/PTSD)  . fluticasone (FLONASE) 50 MCG/ACT nasal spray Place 2 sprays into both nostrils daily. (Patient not taking: Reported on  07/04/2020)  . gabapentin (NEURONTIN) 300 MG capsule Take 300 mg by mouth 3 (three) times daily.  . Gabapentin Enacarbil (HORIZANT) 600 MG TBCR Take 1 tablet by mouth at bedtime as needed (FOR PAIN/NEUROPATHY).  (Patient not taking: Reported on 07/04/2020)  . guaiFENesin (MUCINEX) 600 MG 12 hr tablet Take 1 tablet (600 mg total) by mouth 2 (two) times daily.  Marland Kitchen ipratropium (ATROVENT) 0.03 % nasal spray Place 1 spray into both nostrils daily as needed for rhinitis.   . Multiple Vitamins-Minerals (CENTRUM SILVER PO) Take 1 tablet by mouth daily.  Marland Kitchen MURO 128 2 % ophthalmic solution Place 1 drop into both eyes 2 (two) times daily.  Marland Kitchen MURO 128 5 % ophthalmic ointment Place 1 drop into both eyes at bedtime.  . ranitidine (ZANTAC) 300 MG tablet Take 1 tablet (300 mg total) by mouth at bedtime. For stomach (Patient not taking: Reported on 07/04/2020)   No facility-administered encounter medications on file as of 08/07/2020.  PHYSICAL EXAM / ROS:   Current and past weights: 86 lbs, baseline was 100 lbs a year ago. General: NAD, frail appearing, thin Cardiovascular: no chest pain reported, no edema  Pulmonary: Recently stopped smoking, no cough, production reduced, reduced use of inhaler, no increased SOB, room air Abdomen: appetite good, denies constipation, continent of bowel GU: denies dysuria, incontinent of urine MSK:  ++ joint and ROM abnormalities, ambulatory without device Skin: no rashes or wounds reported Neurological: Weakness, transmyelitis and weakness from that .   Jason Coop, NP , DNP, MPH, [facility]  COVID-19 PATIENT SCREENING TOOL  Person answering questions: ___________Theresa_____ _____   1.  Is the patient or any family member in the home showing any signs or symptoms regarding respiratory infection?               Person with Symptom- __________NA_________________  a. Fever                                                                          Yes___ No___           ___________________  b. Shortness of breath                                                    Yes___ No___          ___________________ c. Cough/congestion                                       Yes___  No___         ___________________ d. Body aches/pains                                                         Yes___ No___        ____________________ e. Gastrointestinal symptoms (diarrhea, nausea)           Yes___ No___        ____________________  2. Within the past 14 days, has anyone living in the home had any contact with someone with or under investigation for COVID-19?    Yes___ No_X_   Person __________________

## 2020-09-25 ENCOUNTER — Other Ambulatory Visit: Payer: Self-pay | Admitting: Primary Care

## 2020-09-25 ENCOUNTER — Other Ambulatory Visit: Payer: Self-pay

## 2020-09-25 DIAGNOSIS — J439 Emphysema, unspecified: Secondary | ICD-10-CM

## 2020-09-25 DIAGNOSIS — G9589 Other specified diseases of spinal cord: Secondary | ICD-10-CM

## 2020-09-25 DIAGNOSIS — Z515 Encounter for palliative care: Secondary | ICD-10-CM

## 2020-09-25 NOTE — Progress Notes (Signed)
Therapist, nutritional Palliative Care Consult Note Telephone: [phone]  Fax: [phone]  TELEHEALTH VISIT STATEMENT Due to the COVID-19 crisis, this visit was done via telemedicine from my office. It was initiated and consented to by this patient and/or family.   PATIENT NAME: [name] [address] [place] [address] [phone] (home)  DOB: Aug 07, 1956 MRN: [id number]  PRIMARY CARE PROVIDER:    Tedrick Port Robert, MD,  [address] [place] [address] [phone]  REFERRING PROVIDER:   Thaila Bottoms Robert, MD [address] [place],  [place] [address] [phone]  RESPONSIBLE PARTY:   Extended Emergency Contact Information Primary Emergency Contact: Dudenhoeffer,Max Address: [phone] [place], [place] Home Phone: [phone] Mobile Phone: [phone] Relation: Spouse    Patient requested to change to telemedicine visit due to fatigue. I identified her as [name] by 2 identifiers.  ASSESSMENT AND RECOMMENDATIONS:   1. Advance Care Planning/Goals of Care: Goals include to maximize quality of life and symptom management. Our advance care planning conversation included a discussion about:     The value and importance of advance care planning   Exploration of personal, cultural or spiritual beliefs that might influence medical decisions   Exploration of goals of care in the event of a sudden injury or illness  Patient is being seen by [facility] Transverse Myelitis clinic. She has progressive symptoms and endorses loss of function. Pt has DNR on record and in the home.  2. Symptom Management:   Neurological deficits: She has lost more and more function, she has dry, irritated eyes, can no longer stand or walk. She needs a bedside and transport chair prescription from PCP faxed to [facility] pharmacy please.    Pain: Ongoing headaches, reported to neurology. Also seen in pain management for  ongoing MSK and neurological pain. [facility] has asked PCP to clear with EKG for a trial of verapamil  for cluster headaches.  Burden of transportation: [place]. She has had transportation issues coming from a long distance to [facility] where she receives charity care for her services. We dicussed their plan to possibly relocate toa  town for better service provision.  Falls: Having falls from having legs weakened and cannot stand. Needs bsc and wheel chair for transport. She is using rollator now for getting around. Discussed need for transport chair as she becomes less and less mobile, and for going out to MD appts.   Social Services: Has many applications for services to complete. These are overwhelming to her. She does have her friend coming out to help her with this time consuming and detailed process.  3. Follow up Palliative Care Visit: Palliative care will continue to follow for goals of care clarification and symptom management. Return 4 weeks or prn.  4. Family /Caregiver/Community Supports: [place]. Is in process of applying to DSS and disability services.  5. Cognitive / Functional decline: A and O x 3, h/o PTSD and anxiety. Declining rapidly in ability to do adls, iadls.  I spent 60 minutes providing this consultation,  from 1045  to 1145. More than 50% of the time in this consultation was spent coordinating communication.   CHIEF COMPLAINT: weakness, loss of mobility  HISTORY OF PRESENT ILLNESS:  [name] is a 64 y.o. year old female  with increasing loss  of neurological function, inability to transfer and chronic pain. Neurological sx have increased in decline in past 4 weeks. She cannot bear weight now to transfer and must be transferred by husband to chair. She understands this is the progression of the disease. We are asked to consult around debilitating loss of function in the context of progressive  neurological disease.     Palliative Care was asked to follow this patient by consultation request of Adie Vilar Robert, MD to help address advance care planning and goals of care. This is a follow up visit.  CODE STATUS: DNR PPS: 40%  HOSPICE ELIGIBILITY/DIAGNOSIS: TBD  Exam/ROS  General: NAD EYES: endorses vision changes,dry eyes, wears glasses ENMT: denies dysphagia Cardiovascular: denies chest pain, endorses DOE, denies LE Edema Pulmonary: denies  cough, denies increased SOB Abdomen: endorses fair appetite,  endorses incontinence of bowel GU: denies dysuria, endorses incontinence of urine MSK:  endorses ROM limitations, Several falls reported Skin: denies rashes or wounds Neurological: endorses increasing weakness, endorses pain Psych: Endorses anxious mood, endorses A and O x 3   CURRENT PROBLEM LIST:  Patient Active Problem List   Diagnosis Date Noted  . 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 Anxiety disorder 10/05/2019  . Chronic post-traumatic stress disorder (PTSD) 10/05/2019  . Chronic right shoulder pain 09/27/2019  . Acute pain of left knee 09/27/2019  . PNA (pneumonia) 07/07/2019  . Acute Hypoxic Respiratory failure,  07/07/2019  . 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 Leukocytosis 07/07/2019  . Acute pancreatitis 07/06/2019  . Oral candidiasis 02/02/2019  . Perforated appendicitis 01/10/2019  . Acute appendicitis 01/08/2019   PAST MEDICAL HISTORY:  Past Medical History:  Diagnosis Date  . Chronic pain    gneralized denies fibromyalgia  . COPD (chronic obstructive pulmonary disease) ([facility])   . Depression   . Diverticulosis   . IBS (irritable bowel syndrome)   . Panic disorder   . Panic disorder   . PTSD (post-traumatic stress disorder)   . PTSD (post-traumatic stress disorder) 01/08/2019  . Temporomandibular joint disorder (TMJ)    age 64 fractured TMJ - repaired by oral surgeon    SOCIAL HX:  Social History   Tobacco Use  . Smoking  status: Former Smoker    Packs/day: 0.50    Types: Cigarettes    Quit date: 08/04/2020    Years since quitting: 0.1  . Smokeless tobacco: Never Used  Substance Use Topics  . Alcohol use: No   FAMILY HX:  Family History  Problem Relation Age of Onset  . Polymyalgia rheumatica Mother   . Migraines Mother   . Coronary artery disease Father   . Breast cancer Neg Hx     ALLERGIES:  Allergies  Allergen Reactions  . Nsaids     Advised not to take   . Toradol [Ketorolac Tromethamine] Nausea And Vomiting and Swelling    TONGUE SWELLING  . Tramadol Nausea And Vomiting  . Benadryl [Diphenhydramine Hcl] Palpitations    Insomnia  . Ceftin [Cefuroxime Axetil] Rash  . Compazine Palpitations    Insomnia  . Epinephrine Palpitations    Insomina     PERTINENT MEDICATIONS:  Outpatient Encounter Medications as of 09/25/2020  Medication Sig  . albuterol (PROVENTIL HFA;VENTOLIN HFA) 108 (90 Base) MCG/ACT inhaler Inhale 1-2 puffs into the lungs every 6 (six) hours as needed for wheezing or shortness of breath.  Marland Kitchen albuterol (PROVENTIL) (2.5 MG/3ML) 0.083% nebulizer solution Take 3 mLs (2.5 mg total) by nebulization every 4 (four) hours  as needed for wheezing or shortness of breath.  . Azelastine HCl 137 MCG/SPRAY SOLN Place 1 spray into the nose daily as needed (for nasal congestion).   . baclofen (LIORESAL) 10 MG tablet Take 0.5 tablets (5 mg total) by mouth 2 (two) times daily. (Patient taking differently: Take 10 mg by mouth 3 (three) times daily. )  . budesonide-formoterol (SYMBICORT) 160-4.5 MCG/ACT inhaler Inhale 2 puffs into the lungs 2 (two) times daily.  . clonazePAM (KLONOPIN) 1 MG tablet Take 0.5 tablets (0.5 mg total) by mouth 2 (two) times daily as needed for anxiety. *May take one additional tablet as needed for anxiety/PTSD (Patient taking differently: Take 1 mg by mouth 4 (four) times daily as needed for anxiety. *May take one additional tablet as needed for anxiety/PTSD)  .  fluticasone (FLONASE) 50 MCG/ACT nasal spray Place 2 sprays into both nostrils daily. (Patient not taking: Reported on 07/04/2020)  . gabapentin (NEURONTIN) 300 MG capsule Take 300 mg by mouth 3 (three) times daily.  . Gabapentin Enacarbil (HORIZANT) 600 MG TBCR Take 1 tablet by mouth at bedtime as needed (FOR PAIN/NEUROPATHY).  (Patient not taking: Reported on 07/04/2020)  . guaiFENesin (MUCINEX) 600 MG 12 hr tablet Take 1 tablet (600 mg total) by mouth 2 (two) times daily.  Marland Kitchen ipratropium (ATROVENT) 0.03 % nasal spray Place 1 spray into both nostrils daily as needed for rhinitis.   . Multiple Vitamins-Minerals (CENTRUM SILVER PO) Take 1 tablet by mouth daily.  Marland Kitchen MURO 128 2 % ophthalmic solution Place 1 drop into both eyes 2 (two) times daily.  Marland Kitchen MURO 128 5 % ophthalmic ointment Place 1 drop into both eyes at bedtime.  . ranitidine (ZANTAC) 300 MG tablet Take 1 tablet (300 mg total) by mouth at bedtime. For stomach (Patient not taking: Reported on 07/04/2020)   No facility-administered encounter medications on file as of 09/25/2020.     Eliezer Lofts, NP , DNP, MPH, AGPCNP-BC, [facility]

## 2020-11-02 ENCOUNTER — Other Ambulatory Visit: Payer: Medicaid Other | Admitting: Primary Care

## 2020-11-02 ENCOUNTER — Other Ambulatory Visit: Payer: Self-pay

## 2020-11-02 DIAGNOSIS — J439 Emphysema, unspecified: Secondary | ICD-10-CM

## 2020-11-02 DIAGNOSIS — G9589 Other specified diseases of spinal cord: Secondary | ICD-10-CM

## 2020-11-02 DIAGNOSIS — G373 Acute transverse myelitis in demyelinating disease of central nervous system: Secondary | ICD-10-CM

## 2020-11-02 DIAGNOSIS — Z515 Encounter for palliative care: Secondary | ICD-10-CM

## 2020-11-02 NOTE — Progress Notes (Signed)
[facility] Palliative Care Consult Note Telephone: [phone]  Fax: [phone]     Date of encounter: 11/02/20 PATIENT NAME: [name] [address] [place] [address] [phone] (home)  DOB: 03-Sep-1956 MRN: [id number]  PRIMARY CARE PROVIDER:    Vidal Schwalbe, MD,  [address] [place] [address] [phone]  REFERRING PROVIDER:   Vidal Schwalbe, MD [address] [place],  [place] [address] [phone]  RESPONSIBLE PARTY:   Extended Emergency Contact Information Primary Emergency Contact: Utz,Max Address: [address] [place], [place] Phone: [phone] Mobile Phone: [phone] Relation: Spouse  I met face to face with patient and family in  Home. Palliative Care was asked to follow this patient by consultation request of Vidal Schwalbe, MD to help address advance care planning and goals of care. This is a follow up  visit.   ASSESSMENT AND RECOMMENDATIONS:   1. Advance Care Planning/Goals of Care: Goals include to maximize quality of life and symptom management. Our advance care planning conversation included a discussion about:     The value and importance of advance care planning    Exploration of personal, cultural or spiritual beliefs that might influence medical decisions   Identification of a healthcare agent - husband  Review  of an  advance directive document - still thinking on it  Decision  to de-escalate disease focused treatments and diagnostics due to poor QOL/ poor prognosis.   I met with [name] [place]. This situation is leading her to have to make some medical decisions. She states she is too tired to travel the distance to the medical centers and is rethinking participating in various diagnostic testing.   2. Symptom Management:   We discussed her goals for her and care, for  quality of life and what that means. She endorses over and over that it is exhausting for her to go to this appointments, she often misses them and that she has a lot of pain. She will talk with her medical team more candidly about the value of these diagnostics and how they will help them treat her symptoms.   Pain management. Patient took naltrexone while still taking tramadol and had a very violent withdrawal. She states no one told her that she was not to take the tramadol. Indeed there is no label on her naltrexone advising her not to take it within two weeks of a narcotic. We discussed that she  basically went through a detox and that she could try it again when she's off of narcotics for two weeks but she is now refusing saying that it was a horrible experience and she will never take that pill again.   We discussed methadone and she's not sure if she would try that either. These are moot points for now as she is being seen in pain management but for the future, if she were to elect hospice or only palliative management we can revisit managing her medicines with a contract.   Depression we discussed that she has depression of long-standing and has been trying to treat this for many decades. She said she cannot take any antidepressants and that they all have a paradoxical effect. She was not open to trying any although she did say she had never tried Cymbalta. She  also may benefit from chiropractic/physiology approach to some of the pain management. Now she has insurance coverage which may give her more options.   Caregiving strain her husband cares for her and she cares for herself to an extent. She is able to stand and take a few steps holding up the furniture but clearly has a lot of weakness, muscle wasting and instability. We discussed PCS services under Medicaid which would send someone in for personal care and some light housekeeping,Laundry etc. She said she does not want to look into this year  but is thankful to know it exists.    Food insecurity she endorses that she need to reapply for food stamps that they were auto -renewing during Covid but that must've run out. She says that due to her Medicaid she has a different eligibility and isn't sure what that means. She states that prior they got $400 a month with food stamps but now that may not be possible and they do endorse food insecurity. I let her know I would likely ask social work to assist with identification of some resources as well as discussing some of her existential challenges.  3. Follow up Palliative Care Visit: Palliative care will continue to follow for goals of care clarification and symptom management. Return 6-8 weeks or prn.  4. Family /Caregiver/Community Supports:  [place] with spouse and dog, has no other family. Now medicaid recipient for medical help, food stamps.  5. Cognitive / Functional decline: A and O x 3, tearful at plight. More dependent in adls.  I spent 80 minutes providing this consultation,  from 1515 to 1635. More than 50% of the time in this consultation was spent coordinating communication.   CODE STATUS: TBD  PPS: 50%  HOSPICE ELIGIBILITY/DIAGNOSIS: TBD  Subjective:  CHIEF COMPLAINT: pain and debility, depression  HISTORY OF PRESENT ILLNESS:  [name] is a 64 y.o. year old female  with transverse myelitis, chronic pain and loss of function. We are asked to consult around advance care planning and choosing course of care.    History obtained from review of EMR, discussion with primary team, and  interview with family, caregiver  and/or [name]. Records reviewed and summarized above.    CURRENT PROBLEM LIST:  Patient Active Problem List   Diagnosis Date Noted  . 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 Anxiety disorder 10/05/2019  . Chronic post-traumatic stress disorder (PTSD) 10/05/2019  . Chronic right shoulder pain 09/27/2019  . Acute pain  of left knee 09/27/2019  . PNA (pneumonia) 07/07/2019  . Acute Hypoxic Respiratory failure,  07/07/2019  . 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 Leukocytosis 07/07/2019  . Acute pancreatitis 07/06/2019  . Oral candidiasis 02/02/2019  . Perforated appendicitis 01/10/2019  . Acute appendicitis 01/08/2019   PAST MEDICAL HISTORY:  Active Ambulatory Problems    Diagnosis Date Noted  . Acute appendicitis 01/08/2019  . Perforated appendicitis 01/10/2019  . Oral candidiasis 02/02/2019  . Acute pancreatitis 07/06/2019  . PNA (pneumonia) 07/07/2019  . Acute Hypoxic Respiratory failure,  07/07/2019  . 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 Leukocytosis 07/07/2019  . Chronic right shoulder pain 09/27/2019  . Anxiety disorder 10/05/2019  . Acute pain of left knee 09/27/2019  . Chronic post-traumatic stress disorder (PTSD) 10/05/2019  . 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 No Resolved Ambulatory Problems   Past Medical History:  Diagnosis Date  . Chronic pain   . COPD (chronic obstructive pulmonary disease) ([facility])   . Depression   . Diverticulosis   . IBS (irritable bowel syndrome)   . Panic disorder   . Panic disorder   . PTSD (post-traumatic stress disorder)   . PTSD (post-traumatic stress disorder) 01/08/2019  . Temporomandibular joint disorder (TMJ)    SOCIAL HX:  Social History   Tobacco Use  . Smoking status: Former Smoker    Packs/day: 0.50    Types: Cigarettes    Quit date: 08/04/2020    Years since quitting: 0.2  . Smokeless tobacco: Never Used  Substance Use Topics  . Alcohol use: No   FAMILY HX:  Family History  Problem Relation Age of Onset  . Polymyalgia rheumatica Mother   . Migraines Mother   . Coronary artery disease Father   . Breast cancer Neg Hx     ALLERGIES:  Allergies  Allergen Reactions  . Nsaids     Advised not to take   . Toradol [Ketorolac Tromethamine] Nausea And  Vomiting and Swelling    TONGUE SWELLING  . Tramadol Nausea And Vomiting  . Benadryl [Diphenhydramine Hcl] Palpitations    Insomnia  . Ceftin [Cefuroxime Axetil] Rash  . Compazine Palpitations    Insomnia  . Epinephrine Palpitations    Insomina     PERTINENT MEDICATIONS:  Outpatient Encounter Medications as of 11/02/2020  Medication Sig  . albuterol (PROVENTIL HFA;VENTOLIN HFA) 108 (90 Base) MCG/ACT inhaler Inhale 1-2 puffs into the lungs every 6 (six) hours as needed for wheezing or shortness of breath.  Marland Kitchen albuterol (PROVENTIL) (2.5 MG/3ML) 0.083% nebulizer solution Take 3 mLs (2.5 mg total) by nebulization every 4 (four) hours as needed for wheezing or shortness of breath.  . Azelastine HCl 137 MCG/SPRAY SOLN Place 1 spray into the nose daily as needed (for nasal congestion).   . baclofen (LIORESAL) 10 MG tablet Take 0.5 tablets (5 mg total) by mouth 2 (two) times daily. (Patient taking differently: Take 10 mg by mouth 3 (three) times daily. )  . budesonide-formoterol (SYMBICORT) 160-4.5 MCG/ACT inhaler Inhale 2 puffs into the lungs 2 (two) times daily.  . clonazePAM (KLONOPIN) 1 MG tablet Take 0.5 tablets (0.5 mg total) by mouth 2 (two) times daily as needed for anxiety. *May take one additional tablet as needed for anxiety/PTSD (Patient taking differently: Take 1 mg by mouth 4 (four) times daily as needed for anxiety. *May take one additional tablet as needed for anxiety/PTSD)  . fluticasone (FLONASE) 50 MCG/ACT nasal spray Place 2 sprays into both nostrils daily. (Patient not taking: Reported on 07/04/2020)  . gabapentin (NEURONTIN) 300 MG capsule Take 300 mg by mouth 3 (three) times daily.  . Gabapentin Enacarbil (HORIZANT) 600 MG TBCR Take 1 tablet by mouth at bedtime as needed (FOR PAIN/NEUROPATHY).  (Patient not taking: Reported on 07/04/2020)  . guaiFENesin (MUCINEX) 600 MG 12 hr tablet Take 1 tablet (600 mg total) by mouth 2 (two) times daily.  Marland Kitchen ipratropium (ATROVENT) 0.03 % nasal  spray Place 1 spray into both nostrils daily as needed for rhinitis.   . Multiple Vitamins-Minerals (CENTRUM SILVER PO) Take 1 tablet by mouth daily.  Marland Kitchen MURO 128 2 % ophthalmic solution Place 1 drop into both eyes 2 (two) times daily.  Marland Kitchen MURO 128 5 % ophthalmic ointment Place 1 drop into both eyes at bedtime.  . ranitidine (ZANTAC) 300 MG tablet Take 1 tablet (300 mg total) by mouth at bedtime. For  stomach (Patient not taking: Reported on 07/04/2020)   No facility-administered encounter medications on file as of 11/02/2020.    Objective: ROS  General: NAD ENMT: denies dysphagia Cardiovascular: denies chest pain Pulmonary: endorses cough, denies increased SOB,  Abdomen: endorses fair to poor appetite, endorses  occ constipation, endorses continence of bowel GU: denies dysuria, endorses continence of urine MSK:  Endorses decreased ROM, occ falls reported Skin: denies rashes or wounds Neurological: endorses worsening weakness, endorses severe pain, endorses insomnia Psych: Endorses depressed  mood Heme/lymph/immuno: denies bruises, abnormal bleeding  Physical Exam: Current and past weights:unavailable Constitutional:  NAD General :frail appearing, thin EYES: anicteric sclera,lids intact, no discharge  ENMT: intact hearing,oral mucous membranes moist, dentition intact CV: no LE edema Pulmonary: no increased work of breathing, no cough, no audible wheezes, room air Abdomen: intake 50%,no ascites GU: deferred MSK: severe sacropenia, decreasingROM in all extremities, no contractures of LE,  Ambulatory with assistance short distances Skin: warm and dry, no rashes or wounds on visible skin Neuro: + Weakness, no cognitive impairment, loss of neuro function due to transverse myelitis Psych: depressed, tearful and anxious affect, A and O x 3 Hem/lymph/immuno: no widespread bruising  Thank you for the opportunity to participate in the care of Ms. Lasky.  The palliative care team will  continue to follow. Please call our office at [phone] if we can be of additional assistance.  Jason Coop, NP , DNP, MPH, AGPCNP-BC, ACHPN  COVID-19 PATIENT SCREENING TOOL  Person answering questions: ____________self______ _____   1.  Is the patient or any family member in the home showing any signs or symptoms regarding respiratory infection?               Person with Symptom- __________NA_________________  a. Fever                                                                          Yes___ No___          ___________________  b. Shortness of breath                                                    Yes___ No___          ___________________ c. Cough/congestion                                       Yes___  No___         ___________________ d. Body aches/pains                                                         Yes___ No___        ____________________ e. Gastrointestinal symptoms (diarrhea, nausea)           Yes___ No___  ____________________  2. Within the past 14 days, has anyone living in the home had any contact with someone with or under investigation for COVID-19?    Yes___ No_X_   Person __________________

## 2020-11-16 ENCOUNTER — Other Ambulatory Visit: Payer: Self-pay

## 2020-11-16 ENCOUNTER — Other Ambulatory Visit: Payer: Medicaid Other | Admitting: Primary Care

## 2020-11-16 DIAGNOSIS — G373 Acute transverse myelitis in demyelinating disease of central nervous system: Secondary | ICD-10-CM

## 2020-11-16 DIAGNOSIS — J439 Emphysema, unspecified: Secondary | ICD-10-CM

## 2020-11-16 DIAGNOSIS — Z515 Encounter for palliative care: Secondary | ICD-10-CM

## 2020-11-16 DIAGNOSIS — F4312 Post-traumatic stress disorder, chronic: Secondary | ICD-10-CM

## 2020-11-16 NOTE — Progress Notes (Signed)
Therapist, [facility] Palliative Care Consult Note Telephone: [phone]  Fax: [phone]   TELEHEALTH VISIT STATEMENT Due to the COVID-19 crisis, this visit was done via telemedicine from my office. It was initiated and consented to by this patient and/or family.   Date of encounter: 11/16/20 PATIENT NAME: [name] [address] [place] [address] [phone] (home)  DOB: 1956-03-23 MRN: [id number]  PRIMARY CARE PROVIDER:    Copeland Lapier Robert, MD,  [address] [place] [address] [phone]  REFERRING PROVIDER:   Treyvin Glidden Robert, MD [address] [place],  [place] [address] [phone]  RESPONSIBLE PARTY:   Extended Emergency Contact Information Primary Emergency Contact: Langdon,Max Address: [phone] [place], [place] Home Phone: [phone] Mobile Phone: [phone] Relation: Spouse   Palliative Care was asked to follow this patient by consultation request of Kita Neace Robert, MD to help address advance care planning and goals of care. This is a follow up  visit.   ASSESSMENT AND RECOMMENDATIONS:   1. Advance Care Planning/Goals of Care: Goals include to maximize quality of life and symptom management. Our advance care planning conversation included a discussion about:     The value and importance of advance care planning   Exploration of personal, cultural or spiritual beliefs that might influence medical decisions   Exploration of goals of care in the event of a sudden injury or illness   Identification and preparation of a healthcare agent -Will work on this with a friend and her husband  Review of an  advance directive document - Still working on [employer] and needs to make decisions for MOST. This is still pending her decision.  Voices anxiety at the many health and social choices she must make, upcoming, and overwhelmed by it all. She states she would like an advocate  although many of these tasks she knows she has to do. She is overwhelmed by the demands of her care, her disease and the system.  We discussed her getting more home based services as going out is becoming more difficult and tiring. [place] difficult to [place].  She voices need to f/u with medical team for more information about her illness and possible medical management.  2. Symptom Management:   Immobility: Voices more loss of function and fall risk. Needs use of cane/ walker.  Pain: Discussed her recent bout with using naltrexone without advice to d/c narcotics x 2 years. Frustrated with having had a bad experience, and now needing to see pain management. We discussed non medication modalities, and possibly trying naltrexone again after a narcotic wean, although she is very reticent.  Care deficits: Needs adls assistance, will continue to discuss and possibly apply to [facility] services such as CAP or PCS.  3. Follow up Palliative Care Visit: Palliative care will continue to follow for goals of care clarification and symptom management. Return 4 weeks or prn.  4. Family /Caregiver/Community Supports: [place] setting, few supports.  5. Cognitive / Functional decline: A and O x 3, drowsy at times, endorse fatigue, needs help with all adls and iadls.  I spent 40 minutes providing this consultation,  from 1130 to 1210. More than 50% of the time in this consultation was spent coordinating communication.   CODE STATUS:  FULL CODE  PPS: 40%  HOSPICE ELIGIBILITY/DIAGNOSIS: TBD  Subjective:  CHIEF COMPLAINT: fatigue  HISTORY OF PRESENT ILLNESS:  [name] is a 64 y.o. year old female  with transverse myelitis, fatigue, self care deficits. Chronic pain poorly managed with recent failed use of naltrexone due to no narcotic wean.   We are asked to consult around advance care planning and complex medical decision making.  History  obtained from review of EMR, discussion with primary team, and  interview with family, caregiver  and/or [name]. Records reviewed and summarized above.   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   Leukocytosis 07/07/2019   Acute pancreatitis 07/06/2019   Oral candidiasis 02/02/2019   Perforated appendicitis 01/10/2019   Acute appendicitis 01/08/2019   PAST MEDICAL HISTORY:  Active Ambulatory Problems    Diagnosis Date Noted   Acute appendicitis 01/08/2019   Perforated appendicitis 01/10/2019   Oral candidiasis 02/02/2019   Acute pancreatitis 07/06/2019   PNA (pneumonia) 07/07/2019   Acute Hypoxic Respiratory failure,  07/07/2019   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   Leukocytosis 07/07/2019   Chronic right shoulder pain 09/27/2019   Anxiety disorder 10/05/2019   Acute pain of left knee 09/27/2019   Chronic post-traumatic stress disorder (PTSD) 10/05/2019   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   Transverse myelitis ([facility]) 07/17/2020   Resolved Ambulatory Problems    Diagnosis Date Noted   No Resolved Ambulatory Problems   Past Medical History:  Diagnosis Date   Chronic pain    COPD (chronic obstructive pulmonary disease) ([facility])    Depression    Diverticulosis    IBS (irritable bowel syndrome)    Panic disorder    Panic disorder    PTSD (post-traumatic stress disorder)    PTSD (post-traumatic stress disorder) 01/08/2019   Temporomandibular joint disorder (TMJ)    SOCIAL HX:  Social History   Tobacco Use    Smoking status: Former Smoker    Packs/day: 0.50    Types: Cigarettes    Quit date: 08/04/2020    Years since quitting: 0.2   Smokeless tobacco: Never Used  Substance Use Topics   Alcohol use: No   FAMILY HX:  Family History  Problem Relation Age of Onset   Polymyalgia rheumatica Mother    Migraines Mother    Coronary artery disease Father    Breast cancer Neg Hx       ALLERGIES:  Allergies  Allergen Reactions   Nsaids     Advised not to take    Toradol [Ketorolac Tromethamine] Nausea And Vomiting and Swelling    TONGUE SWELLING   Tramadol Nausea And Vomiting   Benadryl [Diphenhydramine Hcl] Palpitations    Insomnia   Ceftin [Cefuroxime Axetil] Rash   Compazine Palpitations    Insomnia   Epinephrine Palpitations    Insomina     PERTINENT MEDICATIONS:  Outpatient Encounter Medications as of 11/16/2020  Medication Sig   albuterol (PROVENTIL HFA;VENTOLIN HFA) 108 (90 Base) MCG/ACT inhaler Inhale 1-2 puffs into the lungs every 6 (six) hours as needed for wheezing or shortness of breath.   albuterol (PROVENTIL) (2.5 MG/3ML) 0.083% nebulizer solution Take 3  mLs (2.5 mg total) by nebulization every 4 (four) hours as needed for wheezing or shortness of breath.   Azelastine HCl 137 MCG/SPRAY SOLN Place 1 spray into the nose daily as needed (for nasal congestion).    baclofen (LIORESAL) 10 MG tablet Take 0.5 tablets (5 mg total) by mouth 2 (two) times daily. (Patient taking differently: Take 10 mg by mouth 3 (three) times daily. )   budesonide-formoterol (SYMBICORT) 160-4.5 MCG/ACT inhaler Inhale 2 puffs into the lungs 2 (two) times daily.   clonazePAM (KLONOPIN) 1 MG tablet Take 0.5 tablets (0.5 mg total) by mouth 2 (two) times daily as needed for anxiety. *May take one additional tablet as needed for anxiety/PTSD (Patient taking differently: Take 1 mg by mouth 4 (four) times daily as needed for anxiety. *May take one additional tablet as needed for anxiety/PTSD)    fluticasone (FLONASE) 50 MCG/ACT nasal spray Place 2 sprays into both nostrils daily. (Patient not taking: Reported on 07/04/2020)   gabapentin (NEURONTIN) 300 MG capsule Take 300 mg by mouth 3 (three) times daily.   Gabapentin Enacarbil (HORIZANT) 600 MG TBCR Take 1 tablet by mouth at bedtime as needed (FOR PAIN/NEUROPATHY).  (Patient not taking: Reported on 07/04/2020)   guaiFENesin (MUCINEX) 600 MG 12 hr tablet Take 1 tablet (600 mg total) by mouth 2 (two) times daily.   ipratropium (ATROVENT) 0.03 % nasal spray Place 1 spray into both nostrils daily as needed for rhinitis.    Multiple Vitamins-Minerals (CENTRUM SILVER PO) Take 1 tablet by mouth daily.   MURO 128 2 % ophthalmic solution Place 1 drop into both eyes 2 (two) times daily.   MURO 128 5 % ophthalmic ointment Place 1 drop into both eyes at bedtime.   ranitidine (ZANTAC) 300 MG tablet Take 1 tablet (300 mg total) by mouth at bedtime. For stomach (Patient not taking: Reported on 07/04/2020)   No facility-administered encounter medications on file as of 11/16/2020.    Objective: ROS  General: NAD EYES: denies vision changes, wears glasses ENMT: denies dysphagia Cardiovascular: denies chest pain Pulmonary: denies  cough, denies increased SOB Abdomen: endorses fair appetite, endorses  occ constipation, endorses continence of bowel GU: denies dysuria, endorses continence of urine MSK:  endorses ROM limitations, occ  falls reported Skin: denies rashes or wounds Neurological: endorses increased weakness, endorses pain, Psych: Endorses depressed mood Heme/lymph/immuno: denies bruises, abnormal bleeding  Physical Exam: Deferred  Thank you for the opportunity to participate in the care of [name].  The palliative care team will continue to follow. Please call our office at [phone] if we can be of additional assistance.  Eliezer Lofts, NP , DNP, MPH, AGPCNP-BC, [facility]

## 2021-01-02 ENCOUNTER — Other Ambulatory Visit: Payer: Self-pay

## 2021-01-02 ENCOUNTER — Other Ambulatory Visit: Payer: Medicaid Other | Admitting: Primary Care

## 2021-01-02 DIAGNOSIS — F4312 Post-traumatic stress disorder, chronic: Secondary | ICD-10-CM

## 2021-01-02 DIAGNOSIS — J439 Emphysema, unspecified: Secondary | ICD-10-CM

## 2021-01-02 DIAGNOSIS — Z515 Encounter for palliative care: Secondary | ICD-10-CM

## 2021-01-02 DIAGNOSIS — G373 Acute transverse myelitis in demyelinating disease of central nervous system: Secondary | ICD-10-CM

## 2021-01-02 NOTE — Progress Notes (Signed)
Therapist, nutritional Palliative Care Consult Note Telephone: [phone]  Fax: [phone]   TELEHEALTH VISIT STATEMENT Due to the COVID-19 crisis, this visit was done via telemedicine from my office. It was initiated and consented to by this patient and/or family.   Date of encounter: 01/02/21 PATIENT NAME: [name] [address] [place] [address] [phone] (home)  DOB: [birth year] MRN: [id number]  PRIMARY CARE PROVIDER:    Davine Coba Robert, MD,  [address] [place] [address] [phone]  REFERRING PROVIDER:   Lexx Monte Robert, MD [address] [place],  [place] [address] [phone]  RESPONSIBLE PARTY:   Extended Emergency Contact Information Primary Emergency Contact: [name] Address: [phone] [name], [place] Home Phone: [phone] Mobile Phone: [phone] Relation: Spouse   Palliative Care was asked to follow this patient by consultation request of Breanna Ozaki Robert, MD to help address advance care planning and goals of care. This is a follow up  visit.   ASSESSMENT AND RECOMMENDATIONS:   1. Advance Care Planning/Goals of Care: Goals include to maximize quality of life and symptom management. Patient continues to endorse great pain and anxiety from her disease. She recounts recent assessment by dermatology who has recommended MOHS. Patient has decided against for now due to debility and not being able to have pain medications from the procedure. We discussed topical treatment. She asked about f/u since she refuses surgery, and I asked her to discuss non surgical treatments.   She recounts a mistake by pain management which gave her side effects (naltrexone while on narcotics). She declines to see their psych provider as well.  We discussed requirements for  Anyone prescribing as requiring a pain contract from which she cannot vary.  We discussed her needing to be willing to  comply with an opioid contract and be managed by a team including her pcp. She voices frustration with her pain and other losses, such as her dog dying this week.   I would recommend for in office PC services when those begin at the PCP office for better co management, access to labs, etc. I will continue to see at home for palliative management.  3. Follow up Palliative Care Visit: Palliative care will continue to follow for goals of care clarification and symptom management. Return 6 weeks or prn.  4. Family /Caregiver/Community Supports: lives at home with husband, Has some social services. To see PCP and neurology, dermatology upcoming.  5. Cognitive / Functional decline: A and O x 3, poor function due to transverse myelitis progression.   I spent 60 minutes providing this consultation,  from 1100 to 1200. More than 50% of the time in this consultation was spent coordinating communication.   CODE STATUS:FULL  PPS: 40%  HOSPICE ELIGIBILITY/DIAGNOSIS: TBD  Subjective:  CHIEF COMPLAINT: pain  HISTORY OF PRESENT ILLNESS:  [name] is a 65 y.o. year old female  with pain due to TM. She is not well managed and has not been successful with several pain management clinics.  Pain is constant and only relieved with narcotics and benzodiazepines per her report.  We are asked to consult around advance care planning and complex medical decision making.    Review and summarization of old Epic records shows or history from other than patient. . History obtained from review of EMR, discussion with primary team, and  interview with family, caregiver  and/or [name]. Records reviewed and summarized above.   CURRENT PROBLEM LIST:  Patient Active Problem List   Diagnosis Date Noted  . 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 Anxiety disorder 10/05/2019  . Chronic post-traumatic stress disorder (PTSD) 10/05/2019  . Chronic right shoulder  pain 09/27/2019  . Acute pain of left knee 09/27/2019  . PNA (pneumonia) 07/07/2019  . Acute Hypoxic Respiratory failure,  07/07/2019  . 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 Leukocytosis 07/07/2019  . Acute pancreatitis 07/06/2019  . Oral candidiasis 02/02/2019  . Perforated appendicitis 01/10/2019  . Acute appendicitis 01/08/2019   PAST MEDICAL HISTORY:  Active Ambulatory Problems    Diagnosis Date Noted  . Acute appendicitis 01/08/2019  . Perforated appendicitis 01/10/2019  . Oral candidiasis 02/02/2019  . Acute pancreatitis 07/06/2019  . PNA (pneumonia) 07/07/2019  . Acute Hypoxic Respiratory failure,  07/07/2019  . 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 Leukocytosis 07/07/2019  . Chronic right shoulder pain 09/27/2019  . Anxiety disorder 10/05/2019  . Acute pain of left knee 09/27/2019  . Chronic post-traumatic stress disorder (PTSD) 10/05/2019  . 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 No Resolved Ambulatory Problems   Past Medical History:  Diagnosis Date  . Chronic pain   . COPD (chronic obstructive pulmonary disease) (HCC)   . Depression   . Diverticulosis   . IBS (irritable bowel syndrome)   . Panic disorder   . Panic disorder   . PTSD (post-traumatic stress disorder)   . PTSD (post-traumatic stress disorder) 01/08/2019  . Temporomandibular joint disorder (TMJ)    SOCIAL HX:  Social History   Tobacco Use  . Smoking status: Former Smoker    Packs/day: 0.50    Types: Cigarettes    Quit date: 08/04/2020    Years since quitting: 0.4  . Smokeless tobacco: Never Used  Substance Use Topics  . Alcohol use: No   FAMILY HX:  Family History  Problem Relation Age of Onset  . Polymyalgia rheumatica Mother   . Migraines Mother   . Coronary artery disease Father   . Breast cancer Neg Hx       ALLERGIES:  Allergies  Allergen Reactions  . Nsaids      Advised not to take   . Toradol [Ketorolac Tromethamine] Nausea And Vomiting and Swelling    TONGUE SWELLING  . Tramadol Nausea And Vomiting  . Benadryl [Diphenhydramine Hcl] Palpitations    Insomnia  . Ceftin [Cefuroxime Axetil] Rash  . Compazine Palpitations    Insomnia  . Epinephrine Palpitations    Insomina     PERTINENT MEDICATIONS:  Outpatient Encounter Medications as of 01/02/2021  Medication Sig  . albuterol (PROVENTIL HFA;VENTOLIN HFA) 108 (90 Base) MCG/ACT inhaler Inhale 1-2 puffs into the lungs every 6 (six) hours as needed for wheezing or shortness of breath.  Marland Kitchen albuterol (PROVENTIL) (2.5 MG/3ML) 0.083% nebulizer solution Take 3 mLs (2.5 mg total) by nebulization every 4 (four) hours as needed for wheezing or shortness of breath.  . Azelastine HCl 137 MCG/SPRAY SOLN Place 1 spray into the nose daily as needed (for nasal congestion).   . baclofen (LIORESAL) 10 MG tablet Take 0.5 tablets (5 mg total) by mouth 2 (two) times daily. (Patient taking differently: Take 10 mg by mouth 3 (three) times daily. )  . budesonide-formoterol (SYMBICORT) 160-4.5 MCG/ACT inhaler Inhale  2 puffs into the lungs 2 (two) times daily.  . clonazePAM (KLONOPIN) 1 MG tablet Take 0.5 tablets (0.5 mg total) by mouth 2 (two) times daily as needed for anxiety. *May take one additional tablet as needed for anxiety/PTSD (Patient taking differently: Take 1 mg by mouth 4 (four) times daily as needed for anxiety. *May take one additional tablet as needed for anxiety/PTSD)  . fluticasone (FLONASE) 50 MCG/ACT nasal spray Place 2 sprays into both nostrils daily. (Patient not taking: Reported on 07/04/2020)  . gabapentin (NEURONTIN) 300 MG capsule Take 300 mg by mouth 3 (three) times daily.  . Gabapentin Enacarbil (HORIZANT) 600 MG TBCR Take 1 tablet by mouth at bedtime as needed (FOR PAIN/NEUROPATHY).  (Patient not taking: Reported on 07/04/2020)  . guaiFENesin (MUCINEX) 600 MG 12 hr tablet Take 1 tablet (600 mg total)  by mouth 2 (two) times daily.  Marland Kitchen ipratropium (ATROVENT) 0.03 % nasal spray Place 1 spray into both nostrils daily as needed for rhinitis.   . Multiple Vitamins-Minerals (CENTRUM SILVER PO) Take 1 tablet by mouth daily.  Marland Kitchen MURO 128 2 % ophthalmic solution Place 1 drop into both eyes 2 (two) times daily.  Marland Kitchen MURO 128 5 % ophthalmic ointment Place 1 drop into both eyes at bedtime.  . ranitidine (ZANTAC) 300 MG tablet Take 1 tablet (300 mg total) by mouth at bedtime. For stomach (Patient not taking: Reported on 07/04/2020)   No facility-administered encounter medications on file as of 01/02/2021.    Objective: ROS and PE:  Deferred  Thank you for the opportunity to participate in the care of Ms. Harr.  The palliative care team will continue to follow. Please call our office at [phone] if we can be of additional assistance.  Eliezer Lofts, NP , DNP, MPH, AGPCNP-BC, [facility]

## 2021-02-15 ENCOUNTER — Other Ambulatory Visit: Payer: Medicaid Other | Admitting: Primary Care

## 2021-03-05 ENCOUNTER — Other Ambulatory Visit: Payer: Medicaid Other | Admitting: Primary Care

## 2021-03-05 ENCOUNTER — Other Ambulatory Visit: Payer: Self-pay

## 2021-03-05 DIAGNOSIS — F4312 Post-traumatic stress disorder, chronic: Secondary | ICD-10-CM

## 2021-03-05 DIAGNOSIS — G9589 Other specified diseases of spinal cord: Secondary | ICD-10-CM

## 2021-03-05 DIAGNOSIS — G373 Acute transverse myelitis in demyelinating disease of central nervous system: Secondary | ICD-10-CM

## 2021-03-05 DIAGNOSIS — Z515 Encounter for palliative care: Secondary | ICD-10-CM

## 2021-03-05 NOTE — Progress Notes (Signed)
Designer, jewellery Palliative Care Consult Note Telephone: [phone]  Fax: [phone]    Date of encounter: 03/05/21 PATIENT NAME: [name] [address] [place] [address] [phone] (home)  DOB: July 31, 1956 MRN: [id number]  PRIMARY CARE PROVIDER:    Vidal Schwalbe, MD,  [address] [place] [address] [phone]  REFERRING PROVIDER:   Vidal Schwalbe, MD [address] [place],  [place] [address] [phone]  RESPONSIBLE PARTY:   Extended Emergency Contact Information Primary Emergency Contact: Mozley,Max Address: [address] [place], [place] Phone: [phone] Mobile Phone: [phone] Relation: Spouse  I met face to face with patient and family in  home. Palliative Care was asked to follow this patient by consultation request of Vidal Schwalbe, MD to help address advance care planning and goals of care. This is a follow up visit.   ASSESSMENT AND RECOMMENDATIONS:   1. Advance Care Planning/Goals of Care: Goals include to maximize quality of life and symptom management. Our advance care planning conversation included a discussion about:     The value and importance of advance care planning   Exploration of personal, cultural or spiritual beliefs that might influence medical decisions   Exploration of goals of care in the event of a sudden injury or illness   Identification and preparation of a healthcare agent   Review  of an  advance directive document=has not chosen one but has 5 Wishes.Does not feel husband can handle the stress of making these decisions  Decision de-escalate disease focused treatments due to poor prognosis.  Patient tearful about progression of disease, discussed with MD last visit. We discussed her not wanting to be prolonged on a feeding tube or a trach. She states she does not want to go to LTC. We discussed her decisions as being advance plans  for disease progression.  Prepared MOST with DNR, Limited scope, determine use for IV and abx for trial, no feeding tube. Discussed disease progressing and impact on these ACP directives. Uploaded to [facility]  I spent 60 minutes providing this consultation,  from 1300 to 1400.  More than 50% of the time in this consultation was spent in counseling and care coordination.  ----------------------------------------------------------------------------------------------------------------------------------------------------------------------------------------------------------------------- 2. Symptom Management:   Mood: Tearful about her dog's recent death. Endorses ongoing exhaustion at all the medical appts. Endorses migraine that is ongoing.To discuss with neurology. Recommend wellbutrin for migraine if she can tolerate.  Mobility: Up walking today without a walker or cane. This seems improved. Denies recent falls. Has ramp to home.  EYES: endorses eye drainage, rhinitis. For ENT  And opthalmology f/u.  Resources: States she has had too much going on to see about  all the resources for which she needs to apply. Reviewed the upcoming MD appts. Needs appt for MOHS for skin cancer on nose. Discussed hospice services in context of advancing disease.  Disease progress: Has upcoming appts. Has received news about progression of disease which informs our ACP talk today. She states she appreciates knowing the facts even if they are sad.  Leg wounds: New wounds on LE. Given foam patch and compression stockings to improve edema. Able to do own care but will call home health if wounds worsen.  Area slightly open, no s/sx infection. Should heal with covering and compression.   3. Follow up Palliative Care Visit: Palliative care will continue to follow for  goals of care clarification and symptom management. Return 8-12 weeks or prn.  4. Family /Caregiver/Community Supports[place]  5. Cognitive / Functional decline: A and O x 3. Able to walk in home (I). Able to do some adls, dependent in iadls.  CODE STATUS: DNR  PPS: 50%  HOSPICE ELIGIBILITY/DIAGNOSIS: TBD  Subjective:  CHIEF COMPLAINT: pain and debility from Transverse myelitis  HISTORY OF PRESENT ILLNESS:  [name] is a 65 y.o. year old female  with transverse myelitis, incontinence, chronic pain, disease progression. Pain continues to be issue in context of complex neurological disease,  with LE pain due to edema. Has constant pain all over, esp neck. She also has edema which leads to pain in LE. She is rx with narcotic from PCP, PCP Aware reviewed.  She is less and less mobile as a result of the pain.  We are asked to consult around advance care planning and complex medical decision making.    Review and summarization of old Epic records shows or history from other than patient, including family members.  Review or lab tests Recent labs with hem/onc (CBC, MGUS series),  and albumin 3.8 Review radiology results RE CT scan of 1/22, no signs of malignancy  History obtained from review of EMR, discussion with primary team, and  interview with family, caregiver  and/or Ms. Philyaw. Records reviewed and summarized above.   CURRENT PROBLEM LIST:  Patient Active Problem List   Diagnosis Date Noted  . Transverse myelitis ([place]) 07/17/2020  . Myelomalacia of cervical cord ([place]) 10/17/2019  . Anxiety disorder 10/05/2019  . Chronic post-traumatic stress disorder (PTSD) 10/05/2019  . Chronic right shoulder pain 09/27/2019  . Acute pain of left knee 09/27/2019  . PNA (pneumonia) 07/07/2019  . Acute Hypoxic Respiratory failure,  07/07/2019  . COPD with emphysema ([place]) 07/07/2019  . Leukocytosis 07/07/2019  . Acute pancreatitis 07/06/2019  . Oral candidiasis 02/02/2019  . Perforated appendicitis 01/10/2019  . Acute appendicitis 01/08/2019   PAST MEDICAL HISTORY:  Active Ambulatory Problems     Diagnosis Date Noted  . Acute appendicitis 01/08/2019  . Perforated appendicitis 01/10/2019  . Oral candidiasis 02/02/2019  . Acute pancreatitis 07/06/2019  . PNA (pneumonia) 07/07/2019  . Acute Hypoxic Respiratory failure,  07/07/2019  . COPD with emphysema ([place]) 07/07/2019  . Leukocytosis 07/07/2019  . Chronic right shoulder pain 09/27/2019  . Anxiety disorder 10/05/2019  . Acute pain of left knee 09/27/2019  . Chronic post-traumatic stress disorder (PTSD) 10/05/2019  . Myelomalacia of cervical cord ([place]) 10/17/2019  . Transverse myelitis ([place]) 07/17/2020   Resolved Ambulatory Problems    Diagnosis Date Noted  . No Resolved Ambulatory Problems   Past Medical History:  Diagnosis Date  . Chronic pain   . COPD (chronic obstructive pulmonary disease) ([place])   . Depression   . Diverticulosis   . IBS (irritable bowel syndrome)   . Panic disorder   . Panic disorder   . PTSD (post-traumatic stress disorder)   . PTSD (post-traumatic stress disorder) 01/08/2019  . Temporomandibular joint disorder (TMJ)    SOCIAL HX:  Social History   Tobacco Use  . Smoking status: Former Smoker    Packs/day: 0.50    Types: Cigarettes    Quit date: 08/04/2020    Years since quitting: 0.5  . Smokeless tobacco: Never Used  Substance Use Topics  . Alcohol use: No   FAMILY HX:  Family History  Problem Relation Age of Onset  .  Polymyalgia rheumatica Mother   . Migraines Mother   . Coronary artery disease Father   . Breast cancer Neg Hx       ALLERGIES:  Allergies  Allergen Reactions  . Nsaids     Advised not to take   . Toradol [Ketorolac Tromethamine] Nausea And Vomiting and Swelling    TONGUE SWELLING  . Tramadol Nausea And Vomiting  . Benadryl [Diphenhydramine Hcl] Palpitations    Insomnia  . Ceftin [Cefuroxime Axetil] Rash  . Compazine Palpitations    Insomnia  . Epinephrine Palpitations    Insomina     PERTINENT MEDICATIONS:  Outpatient Encounter Medications as of  03/05/2021  Medication Sig  . albuterol (PROVENTIL HFA;VENTOLIN HFA) 108 (90 Base) MCG/ACT inhaler Inhale 1-2 puffs into the lungs every 6 (six) hours as needed for wheezing or shortness of breath.  Marland Kitchen albuterol (PROVENTIL) (2.5 MG/3ML) 0.083% nebulizer solution Take 3 mLs (2.5 mg total) by nebulization every 4 (four) hours as needed for wheezing or shortness of breath.  . Azelastine HCl 137 MCG/SPRAY SOLN Place 1 spray into the nose daily as needed (for nasal congestion).   . baclofen (LIORESAL) 10 MG tablet Take 0.5 tablets (5 mg total) by mouth 2 (two) times daily. (Patient taking differently: Take 10 mg by mouth 3 (three) times daily. )  . budesonide-formoterol (SYMBICORT) 160-4.5 MCG/ACT inhaler Inhale 2 puffs into the lungs 2 (two) times daily.  . clonazePAM (KLONOPIN) 1 MG tablet Take 0.5 tablets (0.5 mg total) by mouth 2 (two) times daily as needed for anxiety. *May take one additional tablet as needed for anxiety/PTSD (Patient taking differently: Take 1 mg by mouth 4 (four) times daily as needed for anxiety. *May take one additional tablet as needed for anxiety/PTSD)  . fluticasone (FLONASE) 50 MCG/ACT nasal spray Place 2 sprays into both nostrils daily. (Patient not taking: Reported on 07/04/2020)  . gabapentin (NEURONTIN) 300 MG capsule Take 300 mg by mouth 3 (three) times daily.  . Gabapentin Enacarbil (HORIZANT) 600 MG TBCR Take 1 tablet by mouth at bedtime as needed (FOR PAIN/NEUROPATHY).  (Patient not taking: Reported on 07/04/2020)  . guaiFENesin (MUCINEX) 600 MG 12 hr tablet Take 1 tablet (600 mg total) by mouth 2 (two) times daily.  Marland Kitchen ipratropium (ATROVENT) 0.03 % nasal spray Place 1 spray into both nostrils daily as needed for rhinitis.   . Multiple Vitamins-Minerals (CENTRUM SILVER PO) Take 1 tablet by mouth daily.  Marland Kitchen MURO 128 2 % ophthalmic solution Place 1 drop into both eyes 2 (two) times daily.  Marland Kitchen MURO 128 5 % ophthalmic ointment Place 1 drop into both eyes at bedtime.  .  ranitidine (ZANTAC) 300 MG tablet Take 1 tablet (300 mg total) by mouth at bedtime. For stomach (Patient not taking: Reported on 07/04/2020)   No facility-administered encounter medications on file as of 03/05/2021.    Objective: ROS:  General: NAD EYES: endorses eye drainage, ENMT: denies dysphagia, endorses rhinitis, endorse broken tooth Cardiovascular: denies chest pain Pulmonary: denies cough, denies increased SOB Abdomen: endorses fair appetite,denies constipation, endorses diarrhea ,endorses incontinence of bowel GU: denies dysuria, endorses incontinence of urine MSK:  endorses ROM limitations, no falls reported Skin: endorsed R out Lower Leg wound, + nose lesion Neurological: endorses weakness, endorses pain, endorses  Insomnia, drooping R eye on arising, r/o cluster headache Psych: Endorses depressed mood Heme/lymph/immuno: denies bruises, abnormal bleeding  Physical Exam: Current and past weights: 90 lbs, down from 105 lb in 7/20. May be lower, no scale in  home. Constitutional:  NAD General: frail appearing, ill appearing, thin EYES: anicteric sclera, lids intact, + eye clear discharge, disconjugate gaze ENMT: intact hearing,oral mucous membranes moist CV: 2+  LE edema Pulmonary: no increased work of breathing, no cough, no audible wheezes, room air Abdomen: intake 50%,  soft and non tender, no ascites GU: deferred MSK: severe  sarcopenia, decreased ROM in all extremities, no contractures of LE,  Ambulatory (I) Skin: warm and dry, R LE lesion, 2 mm open, draining serosanguinous Neuro: Generalized weakness, stable,  no cognitive impairment Psych: anxious affect, A and O x 3, depression Hem/lymph/immuno: no widespread bruising  Thank you for the opportunity to participate in the care of Ms. Dorrance.  The palliative care team will continue to follow. Please call our office at [phone] if we can be of additional assistance.  Jason Coop, NP , DNP, MPH,  AGPCNP-BC, A[facility]PN   COVID-19 PATIENT SCREENING TOOL  Person answering questions: _______self____________   1.  Is the patient or any family member in the home showing any signs or symptoms regarding respiratory infection?                  Person with Symptom  ______________na___________ a. Fever/chills/headache                                                        Yes___ No__X_            b. Shortness of breath                                                            Yes___ No__X_           c. Cough/congestion                                               Yes___  No__X_          d. Muscle/Body aches/pains                                                   Yes___ No__X_         e. Gastrointestinal symptoms (diarrhea,nausea)             Yes___ No__X_         f. Sudden loss of smell or taste      Yes___ No__X_        2. Within the past 10 days, has anyone living in the home had any contact with someone with or under investigation for COVID-19?    Yes___ No__X__   Person __________________

## 2021-05-07 ENCOUNTER — Other Ambulatory Visit: Payer: Self-pay

## 2021-05-07 ENCOUNTER — Other Ambulatory Visit: Payer: Medicaid Other | Admitting: Primary Care

## 2021-05-07 DIAGNOSIS — Z515 Encounter for palliative care: Secondary | ICD-10-CM

## 2021-05-07 DIAGNOSIS — J438 Other emphysema: Secondary | ICD-10-CM

## 2021-05-07 DIAGNOSIS — G373 Acute transverse myelitis in demyelinating disease of central nervous system: Secondary | ICD-10-CM

## 2021-05-07 DIAGNOSIS — G9589 Other specified diseases of spinal cord: Secondary | ICD-10-CM

## 2021-05-07 NOTE — Progress Notes (Signed)
[facility] Consult Note Telephone: [phone]  Fax: [phone]    Date of encounter: 05/07/21 PATIENT NAME: [name] [address] [place] [address]   [phone] (home)  DOB: [birth year] MRN: [id number] PRIMARY CARE PROVIDER:    Vidal Schwalbe, MD,  [address] [place] [address] [phone]  REFERRING PROVIDER:   Vidal Schwalbe, MD [address] [place],  [place] [address] [phone]  RESPONSIBLE PARTY:    Contact Information    Name Relation Home Work Mobile   [name] Raechel Chute [phone]  [phone]       I met face to face with patient and family in  Home. Palliative Care was asked to follow this patient by consultation request of  Vidal Schwalbe, MD to address advance care planning and complex medical decision making. This is a follow up visit.                                   ASSESSMENT AND PLAN / RECOMMENDATIONS:   Advance Care Planning/Goals of Care: Goals include to maximize quality of life and symptom management. Our advance care planning conversation included a discussion about:    Exploration of personal, cultural or spiritual beliefs that might influence medical decisions   Exploration of goals of care in the event of a sudden injury or illness   Review of an  advance directive document.  CODE STATUS: DNR  Symptom Management/Plan:  Mobility: States some issues with mobility due to spasming, not being able to stand. Had covid on 4.16.22. States she's not bouncing back. Husband is upcoming on some surgery and she is trying to ascertain how to best remain at home alone. May stay at [place]. Does not have call button in the home.  Pain is not improved. Will f/u with [facility] Pain management. Clinic.  Dyspnea: For a sleep study at [facility] In September. Has gotten a nebulizer for albuterol treatments. Still smoking some.   Self care: Discouraged RE loss of neurological function.  Had a biopsy recently. Has CT coming up.Got help with incontinence devices via medicaid. She needs to call DSS to apply for PCS and transportation services.I have asked [place] Henrene Pastor to call to discuss some social services. I will complete PCS form if pt is interested.   Follow up Palliative Care Visit: Palliative care will continue to follow for complex medical decision making, advance care planning, and clarification of goals. Return 6-8 weeks or prn.  I spent 60 minutes providing this consultation. More than 50% of the time in this consultation was spent in counseling and care coordination.  PPS: 50% HOSPICE ELIGIBILITY/DIAGNOSIS: TBD Chief Complaint: debility HISTORY OF PRESENT ILLNESS:  [name] is a 65 y.o. year old female  with transverse myelitis, debility, PTSD, chronic pain . She is followed by [facility] for management. She has had bx of nerve endings for establishing stage.  She has ongoing pain with narcotics. PCP has referred to pain management at [facility].  History obtained from review of EMR, discussion with primary team, and interview with family, facility staff/caregiver and/or Ms. Goelz.  I reviewed available labs, medications, imaging, studies and related documents from the EMR.  Records reviewed and summarized above.   ROS General: NAD ENMT: denies dysphagia Cardiovascular: denies chest pain, endorses occ  DOE Pulmonary: denies cough, denies increased SOB Abdomen: endorses fair appetite, denies constipation,  endorses incontinence of bowel GU: denies dysuria, endorses incontinence of urine MSK:  endorses weakness,   1 fall reported Skin: denies rashes or wounds Neurological: endorses pain, denies insomnia, neuro sx from TM Psych: Endorses discouraged  mood Heme/lymph/immuno: denies bruises, abnormal bleeding  Physical Exam: Current and past weights: Stable Constitutional: NAD General: frail appearing, thin EYES: anicteric sclera, lids intact, no discharge  ENMT:  intact hearing, oral mucous membranes moist, dentition intact CV:  no LE edema Pulmonary:  no increased work of breathing, no cough, room air Abdomen: intake 50%, no ascites MSK:  Severe  sarcopenia, moves all extremities, ambulatory with touching  furniture Skin: warm and dry, no rashes or wounds on visible skin Neuro:  ++ generalized weakness,  no cognitive impairment Psych: anxious affect, A and O x 3 Hem/lymph/immuno: no widespread bruising   Thank you for the opportunity to participate in the care of [name].  The palliative care team will continue to follow. Please call our office at [phone] if we can be of additional assistance.   Jason Coop, NP , DNP, MPH, AGPCNP-BC, ACHPN  COVID-19 PATIENT SCREENING TOOL Asked and negative response unless otherwise noted:   Have you had symptoms of covid, tested positive or been in contact with someone with symptoms/positive test in the past 5-10 days?

## 2021-05-11 ENCOUNTER — Telehealth: Payer: Self-pay

## 2021-05-11 NOTE — Telephone Encounter (Signed)
05/11/21 @2 :20 PM: Palliative care SW outreached patient, per PC NP - K. , to assess needs and provide support.  Patient requested that she call me back when things have settled for her and her husband. SW provided patient cell number to outreach SW at her convenience. Patient shared she will outreach SW at later time frame.

## 2021-05-24 ENCOUNTER — Other Ambulatory Visit (HOSPITAL_BASED_OUTPATIENT_CLINIC_OR_DEPARTMENT_OTHER): Payer: Self-pay

## 2021-05-24 DIAGNOSIS — J441 Chronic obstructive pulmonary disease with (acute) exacerbation: Secondary | ICD-10-CM

## 2021-05-24 DIAGNOSIS — G4734 Idiopathic sleep related nonobstructive alveolar hypoventilation: Secondary | ICD-10-CM

## 2021-05-26 ENCOUNTER — Encounter (HOSPITAL_COMMUNITY): Payer: Self-pay | Admitting: *Deleted

## 2021-05-26 ENCOUNTER — Other Ambulatory Visit: Payer: Self-pay

## 2021-05-26 ENCOUNTER — Emergency Department (HOSPITAL_COMMUNITY): Payer: Medicaid Other

## 2021-05-26 ENCOUNTER — Emergency Department (HOSPITAL_COMMUNITY)
Admission: EM | Admit: 2021-05-26 | Discharge: 2021-05-26 | Disposition: A | Discharge status: Home / Self Care | Payer: Medicaid Other | Attending: Emergency Medicine | Admitting: Emergency Medicine

## 2021-05-26 DIAGNOSIS — Y92002 Bathroom of unspecified non-institutional (private) residence single-family (private) house as the place of occurrence of the external cause: Secondary | ICD-10-CM | POA: Insufficient documentation

## 2021-05-26 DIAGNOSIS — W19XXXA Unspecified fall, initial encounter: Secondary | ICD-10-CM | POA: Insufficient documentation

## 2021-05-26 DIAGNOSIS — S0083XA Contusion of other part of head, initial encounter: Secondary | ICD-10-CM

## 2021-05-26 DIAGNOSIS — S0993XA Unspecified injury of face, initial encounter: Secondary | ICD-10-CM | POA: Diagnosis present

## 2021-05-26 DIAGNOSIS — M25522 Pain in left elbow: Secondary | ICD-10-CM | POA: Insufficient documentation

## 2021-05-26 DIAGNOSIS — Z87891 Personal history of nicotine dependence: Secondary | ICD-10-CM | POA: Diagnosis not present

## 2021-05-26 DIAGNOSIS — J449 Chronic obstructive pulmonary disease, unspecified: Secondary | ICD-10-CM | POA: Diagnosis not present

## 2021-05-26 HISTORY — DX: Other specified diseases of spinal cord: G95.89

## 2021-05-26 MED ORDER — HYDROCODONE-ACETAMINOPHEN 5-325 MG PO TABS
1.0000 | ORAL_TABLET | Freq: Once | ORAL | Status: AC
Start: 2021-05-26 — End: 2021-05-26
  Administered 2021-05-26: 1 via ORAL
  Filled 2021-05-26: qty 1

## 2021-05-26 NOTE — ED Triage Notes (Signed)
Pt weak and fell last night. C/o pain to left eye, nose, and left side of face.  Left elbow pain since fall.

## 2021-05-26 NOTE — ED Provider Notes (Signed)
[facility] EMERGENCY DEPARTMENT Provider Note   CSN: [id number] Arrival date & time: 05/26/21  1433     History Chief Complaint  Patient presents with   Marletta Lor    [name] is a 65 y.o. female.   Fall   This patient is a 65 year old female, she has a history of myelomalacia which is left her with diffuse muscle wasting and weakness, she usually walks with the assistance of her husband but since he recently had hernia surgery she tried to get to the bathroom last night by herself.  She ended up falling face first into the faucet of the bathroom and then collapsing to the floor which she states has happened in the past when she becomes weak.  She had a difficult time getting off the floor and continued to bump her face and head into the cabinet.  She tried to get up off the floor.  Finally her husband woke up and helped her up, she was able to come to the hospital by private vehicle.  The patient has a little bit of left elbow pain, pain to the nasal bridge and the left side of the face and reports a history of a prior temporomandibular joint fracture when she was a child.  She has no loss of consciousness, no chest pain or shortness of breath, no nausea vomiting or diarrhea.  No injuries to the arms bilaterally except for the elbow on the left.  Bilateral legs without significant injury either.  No medications given prior to arrival.  Past Medical History:  Diagnosis Date   Chronic pain    gneralized denies fibromyalgia   COPD (chronic obstructive pulmonary disease) (HCC)    Depression    Diverticulosis    IBS (irritable bowel syndrome)    Myelomalacia (HCC)    Panic disorder    Panic disorder    PTSD (post-traumatic stress disorder)    PTSD (post-traumatic stress disorder) 01/08/2019   Temporomandibular joint disorder (TMJ)    age 73 fractured TMJ - repaired by oral surgeon    Patient Active Problem List   Diagnosis Date Noted   Transverse myelitis (HCC) 07/17/2020    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   Anxiety disorder 10/05/2019   Chronic post-traumatic stress disorder (PTSD) 10/05/2019   Chronic right shoulder pain 09/27/2019   Acute pain of left knee 09/27/2019   PNA (pneumonia) 07/07/2019   Acute Hypoxic Respiratory failure,  07/07/2019   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   Leukocytosis 07/07/2019   Acute pancreatitis 07/06/2019   Oral candidiasis 02/02/2019   Perforated appendicitis 01/10/2019   Acute appendicitis 01/08/2019    Past Surgical History:  Procedure Laterality Date   CHOLECYSTECTOMY     EYE SURGERY     FRACTURE SURGERY     LAPAROSCOPIC APPENDECTOMY N/A 01/08/2019   Procedure: APPENDECTOMY LAPAROSCOPIC;  Surgeon: [name], MD;  Location: [facility];  Service: General;  Laterality: N/A;     OB History     Gravida  0   Para  0   Term  0   Preterm  0   AB  0   Living  0      SAB  0   IAB  0   Ectopic  0   Multiple  0   Live Births  0           Family History  Problem Relation Age of Onset   Polymyalgia rheumatica Mother    Migraines Mother  Coronary artery disease Father    Breast cancer Neg Hx     Social History   Tobacco Use   Smoking status: Former    Packs/day: 0.50    Pack years: 0.00    Types: Cigarettes    Quit date: 08/04/2020    Years since quitting: 0.8   Smokeless tobacco: Never  Vaping Use   Vaping Use: Never used  Substance Use Topics   Alcohol use: No   Drug use: No    Home Medications Prior to Admission medications   Medication Sig Start Date End Date Taking? Authorizing Provider  albuterol (PROVENTIL HFA;VENTOLIN HFA) 108 (90 Base) MCG/ACT inhaler Inhale 1-2 puffs into the lungs every 6 (six) hours as needed for wheezing or shortness of breath.    [provider]  albuterol (PROVENTIL) (2.5 MG/3ML) 0.083% nebulizer solution Take 3 mLs (2.5 mg total) by nebulization every 4 (four) hours as needed for wheezing or shortness of breath.  07/08/19   Mariea ClontsEmokpae, Courage, MD  Azelastine HCl 137 MCG/SPRAY SOLN Place 1 spray into the nose daily as needed (for nasal congestion).  10/27/18   [provider]  baclofen (LIORESAL) 10 MG tablet Take 0.5 tablets (5 mg total) by mouth 2 (two) times daily. Patient taking differently: Take 10 mg by mouth 3 (three) times daily.  07/08/19   Shon HaleEmokpae, Courage, MD  budesonide-formoterol (SYMBICORT) 160-4.5 MCG/ACT inhaler Inhale 2 puffs into the lungs 2 (two) times daily.    [provider]  clonazePAM (KLONOPIN) 1 MG tablet Take 0.5 tablets (0.5 mg total) by mouth 2 (two) times daily as needed for anxiety. *May take one additional tablet as needed for anxiety/PTSD Patient taking differently: Take 1 mg by mouth 4 (four) times daily as needed for anxiety. *May take one additional tablet as needed for anxiety/PTSD 07/08/19   Shon HaleEmokpae, Courage, MD  fluticasone (FLONASE) 50 MCG/ACT nasal spray Place 2 sprays into both nostrils daily. Patient not taking: Reported on 07/04/2020    [provider]  gabapentin (NEURONTIN) 300 MG capsule Take 300 mg by mouth 3 (three) times daily.    [provider]  Gabapentin Enacarbil (HORIZANT) 600 MG TBCR Take 1 tablet by mouth at bedtime as needed (FOR PAIN/NEUROPATHY).  Patient not taking: Reported on 07/04/2020    [provider]  guaiFENesin (MUCINEX) 600 MG 12 hr tablet Take 1 tablet (600 mg total) by mouth 2 (two) times daily. 07/08/19   Shon HaleEmokpae, Courage, MD  ipratropium (ATROVENT) 0.03 % nasal spray Place 1 spray into both nostrils daily as needed for rhinitis.  10/12/18   [provider]  Multiple Vitamins-Minerals (CENTRUM SILVER PO) Take 1 tablet by mouth daily.    [provider]  MURO 128 2 % ophthalmic solution Place 1 drop into both eyes 2 (two) times daily. 11/10/18   [provider]  MURO 128 5 % ophthalmic ointment Place 1 drop into both eyes at bedtime. 11/10/18   [provider]     Allergies    Nsaids, Toradol [ketorolac tromethamine], Tramadol, Benadryl [diphenhydramine hcl], Ceftin [cefuroxime axetil], Compazine, and Epinephrine  Review of Systems   Review of Systems  All other systems reviewed and are negative.  Physical Exam Updated Vital Signs BP 111/62 (BP Location: Left Arm)   Pulse 76   Temp 97.9 F (36.6 C) (Oral)   Resp 12   SpO2 (!) 87%   Physical Exam Vitals and nursing note reviewed.  Constitutional:      General: She is  not in acute distress.    Appearance: She is well-developed.  HENT:     Head: Normocephalic.     Comments: Mild bruising over the nasal bridge with tenderness to palpation as well as the left zygoma and lateral face    Mouth/Throat:     Pharynx: No oropharyngeal exudate.  Eyes:     General: No scleral icterus.       Right eye: No discharge.        Left eye: No discharge.     Conjunctiva/sclera: Conjunctivae normal.     Pupils: Pupils are equal, round, and reactive to light.  Neck:     Thyroid: No thyromegaly.     Vascular: No JVD.  Cardiovascular:     Rate and Rhythm: Normal rate and regular rhythm.     Heart sounds: Normal heart sounds. No murmur heard.   No friction rub. No gallop.  Pulmonary:     Effort: Pulmonary effort is normal. No respiratory distress.     Breath sounds: Normal breath sounds. No wheezing or rales.  Abdominal:     General: Bowel sounds are normal. There is no distension.     Palpations: Abdomen is soft. There is no mass.     Tenderness: There is no abdominal tenderness.  Musculoskeletal:        General: Tenderness and signs of injury present. No swelling or deformity. Normal range of motion.     Cervical back: Normal range of motion and neck supple.     Right lower leg: No edema.     Left lower leg: No edema.     Comments: Mild tenderness to palpation over the left olecranon  Lymphadenopathy:     Cervical: No cervical adenopathy.  Skin:    General: Skin is warm and dry.      Findings: No erythema or rash.  Neurological:     Mental Status: She is alert.     Coordination: Coordination normal.  Psychiatric:        Behavior: Behavior normal.    ED Results / Procedures / Treatments   Labs (all labs ordered are listed, but only abnormal results are displayed) Labs Reviewed - No data to display  EKG None  Radiology DG ELBOW COMPLETE LEFT (3+VIEW)  Result Date: 05/26/2021 CLINICAL DATA:  Fall EXAM: LEFT ELBOW - COMPLETE 3+ VIEW COMPARISON:  None. FINDINGS: No acute fracture or dislocation. Joint spaces and alignment are maintained. No area of erosion or osseous destruction. No unexpected radiopaque foreign body. Soft tissues are unremarkable. IMPRESSION: No acute fracture or dislocation. Electronically Signed   By: Meda Klinefelter MD   On: 05/26/2021 15:58   CT Maxillofacial Wo Contrast  Result Date: 05/26/2021 CLINICAL DATA:  Left eye, nose and facial pain following a fall last night. EXAM: CT MAXILLOFACIAL WITHOUT CONTRAST TECHNIQUE: Multidetector CT imaging of the maxillofacial structures was performed. Multiplanar CT image reconstructions were also generated. COMPARISON:  05/05/2018 and head CT dated 06/12/2019. FINDINGS: Osseous: No fracture or dislocation. 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 Orbits: Negative. No traumatic or inflammatory finding. Sinuses: Small right maxillary sinus retention cyst and minimal mucosal thickening. Minimal left maxillary sinus mucosal thickening. Soft tissues: Mild left periorbital and facial soft tissue swelling. Limited intracranial: Unremarkable. IMPRESSION: 1. No fracture or dislocation. 2. 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 Electronically Signed   By: Beckie Salts M.D.   On: 05/26/2021 16:13    Procedures Procedures   Medications Ordered in  ED Medications  HYDROcodone-acetaminophen (NORCO/VICODIN) 5-325 MG per tablet 1 tablet (has no administration in time range)     ED Course  I have reviewed the triage vital signs and the nursing notes.  Pertinent labs & imaging results that were available during my care of the patient were reviewed by me and considered in my medical decision making (see chart for details).    MDM Rules/Calculators/A&P                          The patient has a broken tooth with which she will need to follow-up with dentistry.  She will need a CT scan of the maxillofacial bones to look at her nasal bridge and left maxillofacial structures as well.  She otherwise appears well, will obtain a left elbow x-ray.  She has normal lung sounds, when she talks her oxygen drops a little bit but when she is resting she is in the 90 to 95% range.  She has no respiratory symptoms at all.  X-rays are unremarkable showing no signs of fractures, facial bones unremarkable, oxygenating greater than 90% and no symptoms.  Requesting pain medication however the patient has anti-inflammatory allergies, will give single dose of hydrocodone, home on Tylenol  Final Clinical Impression(s) / ED Diagnoses Final diagnoses:  Facial contusion, initial encounter    Rx / DC Orders ED Discharge Orders     None        Eber Hong, MD 05/28/21 1455

## 2021-05-26 NOTE — Discharge Instructions (Addendum)
Tylenol for pain Ibuprofen if you can take it Your xrays show no broken bones -  This will heal over a couple of days - bruising and contusion  See your doctor if you are still hurting in 1 week.

## 2021-09-03 ENCOUNTER — Other Ambulatory Visit: Payer: Medicaid Other | Admitting: Primary Care

## 2021-09-03 ENCOUNTER — Other Ambulatory Visit: Payer: Self-pay

## 2021-10-15 ENCOUNTER — Other Ambulatory Visit: Payer: Medicaid Other | Admitting: Primary Care

## 2021-10-15 ENCOUNTER — Other Ambulatory Visit: Payer: Self-pay

## 2021-10-15 VITALS — Ht 61.0 in | Wt 96.0 lb

## 2021-10-15 DIAGNOSIS — G9589 Other specified diseases of spinal cord: Secondary | ICD-10-CM

## 2021-10-15 DIAGNOSIS — Z515 Encounter for palliative care: Secondary | ICD-10-CM

## 2021-10-15 DIAGNOSIS — F4312 Post-traumatic stress disorder, chronic: Secondary | ICD-10-CM

## 2021-10-15 DIAGNOSIS — G373 Acute transverse myelitis in demyelinating disease of central nervous system: Secondary | ICD-10-CM

## 2021-10-15 NOTE — Progress Notes (Signed)
[facility] Consult Note Telephone: [phone]  Fax: [phone]    Date of encounter: 10/15/21 1:59 PM PATIENT NAME: [name] [address] [place] [address]   [phone] (home)  DOB: January 25, 1956 MRN: [id number] PRIMARY CARE PROVIDER:    Vidal Schwalbe, MD,  [address] [place] [address] [phone]  REFERRING PROVIDER:   Vidal Schwalbe, MD [address] [place],  [place] [address] [phone]  RESPONSIBLE PARTY:    Contact Information     Name Relation Home Work Mobile   Lawrence,Max Spouse [phone]  [phone]        I met face to face with patient in home. Palliative Care was asked to follow this patient by consultation request of  Vidal Schwalbe, MD to address advance care planning and complex medical decision making. This is a follow up visit.                                   ASSESSMENT AND PLAN / RECOMMENDATIONS:   Advance Care Planning/Goals of Care: Goals include to maximize quality of life and symptom management. Our advance care planning conversation included a discussion about:    Exploration of personal, cultural or spiritual beliefs that might influence medical decisions  Review of an  advance directive document. CODE STATUS: DNR  Symptom Management/Plan:  Pain: Outlines her struggles with pain management. She is seen by pain management at [facility]. She endorses not having pain relief with current regimen. I will not be prescribing due to her current relationship with pain management.   Nutrition: weight now 96 lbs, endorses poor appetite. Body mass index is 18.14 kg/m.  Mobility: Is able to ambulate in the home (I). Reports many falls and reports a facial contusion.  Follow up Palliative Care Visit: Palliative care will continue to follow for complex medical decision making, advance care planning, and clarification of goals. Return 12 weeks or prn.  I spent 60 minutes  providing this consultation. More than 50% of the time in this consultation was spent in counseling and care coordination.  PPS: 50%  HOSPICE ELIGIBILITY/DIAGNOSIS: no  Chief Complaint: pain, debility  HISTORY OF PRESENT ILLNESS:  [name] is a 65 y.o. year old female  with transverse myelitis, back and neck pain, chronic use of opioid, frequent falls.   History obtained from review of EMR, discussion with primary team, and interview with family, facility staff/caregiver and/or Ms. Loomis.  I reviewed available labs, medications, imaging, studies and related documents from the EMR.  Records reviewed and summarized above.   ROS  General: NAD EYES: endorses vision changes ENMT: denies dysphagia, endorses dental problems (2 teeth pulled) Cardiovascular: denies chest pain, denies DOE Pulmonary: denies cough, denies increased SOB Abdomen: endorses poor appetite,  endorses diarrhea and constipation, endorses incontinence of bowel GU: denies dysuria, endorses incontinence of urine MSK:  endorses weakness,  frequent  falls reported Skin: denies rashes or wounds Neurological: endorses  pain, denies insomnia, unable to stand straight when standing up Psych: Endorses depressed mood Heme/lymph/immuno: denies bruises, abnormal bleeding  Physical Exam: Current and past weights: 96 lbs, Body mass index is 18.14 kg/m. Constitutional: NAD General: frail appearing, thin EYES: anicteric sclera, lids intact, no discharge  ENMT: intact hearing, oral mucous membranes moist CV: S1S2, RRR, no LE edema Pulmonary: LCTA, no increased work of breathing, no cough, room air Abdomen:  intake 25%, normo-active BS + 4 quadrants, soft and non tender, no ascites GU: deferred MSK: + sarcopenia, moves all extremities, ambulatory in home Skin: warm and dry, no rashes or wounds on visible skin Neuro:  + generalized weakness,  no cognitive impairment Psych: anxious affect, A and O x 3 Hem/lymph/immuno: no  widespread bruising  Thank you for the opportunity to participate in the care of [name].  The palliative care team will continue to follow. Please call our office at [phone] if we can be of additional assistance.   Jason Coop, NP DNP, AGPCNP-BC  COVID-19 PATIENT SCREENING TOOL Asked and negative response unless otherwise noted:   Have you had symptoms of covid, tested positive or been in contact with someone with symptoms/positive test in the past 5-10 days?

## 2022-01-07 ENCOUNTER — Other Ambulatory Visit: Payer: Medicare Other | Admitting: Primary Care

## 2022-01-07 ENCOUNTER — Other Ambulatory Visit: Payer: Self-pay

## 2022-01-15 ENCOUNTER — Telehealth: Payer: Self-pay

## 2022-01-15 NOTE — Telephone Encounter (Signed)
330 pm.  Return call made to patient.  Advised she had missed her appointment with Clearnce Sorrel, NP and was needing to reschedule.   Visit scheduled for February 8th @ 1230 pm.

## 2022-01-23 ENCOUNTER — Other Ambulatory Visit: Payer: Medicare Other | Admitting: Primary Care

## 2022-01-23 ENCOUNTER — Other Ambulatory Visit: Payer: Self-pay

## 2022-01-23 DIAGNOSIS — F4312 Post-traumatic stress disorder, chronic: Secondary | ICD-10-CM

## 2022-01-23 DIAGNOSIS — J439 Emphysema, unspecified: Secondary | ICD-10-CM

## 2022-01-23 DIAGNOSIS — Z515 Encounter for palliative care: Secondary | ICD-10-CM

## 2022-01-23 DIAGNOSIS — R296 Repeated falls: Secondary | ICD-10-CM

## 2022-01-23 DIAGNOSIS — G373 Acute transverse myelitis in demyelinating disease of central nervous system: Secondary | ICD-10-CM

## 2022-01-23 DIAGNOSIS — G8929 Other chronic pain: Secondary | ICD-10-CM

## 2022-01-23 NOTE — Progress Notes (Signed)
[facility] Palliative Care Consult Note Telephone: [phone]  Fax: [phone]    Date of encounter: 01/23/22 12:05 PM PATIENT NAME: [name] [address] [place] [address]   [phone] (home)  DOB: [birth year] MRN: [id number] PRIMARY CARE PROVIDER:    Vidal Schwalbe, MD,  [address] [place] [address] [phone]  REFERRING PROVIDER:   Vidal Schwalbe, MD [address] [place],  [place] [address] [phone]  RESPONSIBLE PARTY:    Contact Information     Name Relation Home Work Mobile   [name],[name] [phone]  [phone]       I met face to face with patient and family in  home. Palliative Care was asked to follow this patient by consultation request of  Vidal Schwalbe, MD to address advance care planning and complex medical decision making. This is a follow up visit.                                   ASSESSMENT AND PLAN / RECOMMENDATIONS:   Advance Care Planning/Goals of Care: Goals include to maximize quality of life and symptom management. Patient/health care surrogate gave his/her permission to discuss.Our advance care planning conversation included a discussion about:     Exploration of personal, cultural or spiritual beliefs that might influence medical decisions  Exploration of goals of care in the event of a sudden injury or illness  Identification of a healthcare agent - husband Breanna Review  of an  advance directive document. CODE STATUS: DNR, limited scope, no change.  Symptom Management/Plan: Face 2 face for dme today. 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 She is visibly more debilitated and has had some ocular changes. She endorses very frequent falls with injury. Order for hospital bed with half rails and transport w/c if desired for in home mobility.  SDOH: Outlines upcoming notice of losing medicaid and  incontinence supplies. Has needs for incontinence supplies which now she cannot afford. Losing medicaid will also impact her food stamps. I will refer to our SW for f/u for services which impact her care delivery.Endorses too many bills,  lack of ability to get health care.  She will call if she'd like to discuss with SW [place].  Immobility: Discussed hospital bed and rails. Needs for safety. She falls out of bed and falls in bathroom. I've recommended she use BSC in room and not go to bathroom where she often falls.  Pain Management: Outlines being dismissed from dental practice, and f/u with [facility] pain clinics. Discussed being on clonazepam. Wants something for pain. PDMP accessed. I will defer to PCP. Encouraged to use ATC tylenol. Discussed other Rx modalities such as aquaTx, recommended [facility] as possible site for therapy as they are related to [facility].   Follow up Palliative Care Visit: Palliative care will continue to follow for complex medical decision making, advance care planning, and clarification of goals. Return 8-12 weeks or prn.  I spent 60 minutes providing this consultation. More than 50% of the time in this consultation was spent in counseling and care coordination.  PPS: 40%  HOSPICE ELIGIBILITY/DIAGNOSIS: TBD  Chief Complaint: pain, immobility  HISTORY OF PRESENT ILLNESS:  [name] is a 66 y.o. year old female  with Transverse myelitis, chronic pain, frequent  falls, depression and anxiety.  History obtained from review of EMR, discussion with primary team, and interview with family, facility staff/caregiver and/or [name].  I reviewed available labs, medications, imaging, studies and related documents from the EMR.  Records reviewed and summarized above.   ROS  General: ill appearing EYES: denies vision changes, gaze increasingly disconjugate ENMT: denies dysphagia Cardiovascular: denies chest pain, endorses DOE Pulmonary:  denies cough, denies increased SOB Abdomen: endorses poor appetite, denies constipation, endorses incontinence of bowel GU: denies dysuria, endorses incontinence of urine MSK:  endorses increased weakness,  frequent falls reported Skin: denies rashes or wounds Neurological: endorses 10/10  pain, denies insomnia, oversleeping Psych: Endorses positive mood Heme/lymph/immuno: denies bruises, abnormal bleeding  Physical Exam: Current and past weights: 88 lbs. Constitutional: NAD General: frail appearing, thin EYES: anicteric sclera, lids intact, no discharge , disconjugate gaze ENMT: intact hearing, oral mucous membranes moist, dentition intact CV: 1+ LE edema Pulmonary:  no increased work of breathing, no cough, room air Abdomen: intake 50%,  no ascites GU: deferred MSK:++ sarcopenia, moves all extremities, ambulatory with walker and frequent falls  Skin: warm and dry, no rashes or wounds on visible skin Neuro:  + generalized weakness,  no cognitive impairment Psych: anxious affect, A and O x 3 Hem/lymph/immuno: no widespread bruising  Thank you for the opportunity to participate in the care of [name].  The palliative care team will continue to follow. Please call our office at [phone] if we can be of additional assistance.   Jason Coop, NP DNP, AGPCNP-BC  COVID-19 PATIENT SCREENING TOOL Asked and negative response unless otherwise noted:   Have you had symptoms of covid, tested positive or been in contact with someone with symptoms/positive test in the past 5-10 days?

## 2022-03-21 ENCOUNTER — Emergency Department (HOSPITAL_COMMUNITY): Payer: Medicare Other

## 2022-03-21 ENCOUNTER — Encounter (HOSPITAL_COMMUNITY): Payer: Self-pay

## 2022-03-21 ENCOUNTER — Emergency Department (HOSPITAL_COMMUNITY)
Admission: EM | Admit: 2022-03-21 | Discharge: 2022-03-21 | Disposition: A | Discharge status: Home / Self Care | Payer: Medicare Other | Attending: Emergency Medicine | Admitting: Emergency Medicine

## 2022-03-21 ENCOUNTER — Other Ambulatory Visit: Payer: Self-pay

## 2022-03-21 DIAGNOSIS — J449 Chronic obstructive pulmonary disease, unspecified: Secondary | ICD-10-CM | POA: Insufficient documentation

## 2022-03-21 DIAGNOSIS — K047 Periapical abscess without sinus: Secondary | ICD-10-CM | POA: Insufficient documentation

## 2022-03-21 DIAGNOSIS — R110 Nausea: Secondary | ICD-10-CM | POA: Diagnosis not present

## 2022-03-21 DIAGNOSIS — R220 Localized swelling, mass and lump, head: Secondary | ICD-10-CM | POA: Insufficient documentation

## 2022-03-21 DIAGNOSIS — Z7951 Long term (current) use of inhaled steroids: Secondary | ICD-10-CM | POA: Diagnosis not present

## 2022-03-21 DIAGNOSIS — K029 Dental caries, unspecified: Secondary | ICD-10-CM | POA: Insufficient documentation

## 2022-03-21 DIAGNOSIS — M791 Myalgia, unspecified site: Secondary | ICD-10-CM | POA: Diagnosis not present

## 2022-03-21 LAB — BASIC METABOLIC PANEL
Anion gap: 8 (ref 5–15)
BUN: 13 mg/dL (ref 8–23)
CO2: 31 mmol/L (ref 22–32)
Calcium: 9.2 mg/dL (ref 8.9–10.3)
Chloride: 102 mmol/L (ref 98–111)
Creatinine, Ser: 0.6 mg/dL (ref 0.44–1.00)
GFR, Estimated: >60 mL/min (ref >60)
Glucose, Bld: 85 mg/dL (ref 70–99)
Potassium: 3.9 mmol/L (ref 3.5–5.1)
Sodium: 141 mmol/L (ref 135–145)

## 2022-03-21 LAB — CBC WITH DIFFERENTIAL/PLATELET
Abs Immature Granulocytes: 0.03 10*3/uL (ref 0.00–0.07)
Basophils Absolute: 0 10*3/uL (ref 0.0–0.1)
Basophils Relative: 0 %
Eosinophils Absolute: 0.3 10*3/uL (ref 0.0–0.5)
Eosinophils Relative: 3 %
HCT: 46 % (ref 36.0–46.0)
Hemoglobin: 15.4 g/dL — ABNORMAL HIGH (ref 12.0–15.0)
Immature Granulocytes: 0 %
Lymphocytes Relative: 28 %
Lymphs Abs: 2.6 10*3/uL (ref 0.7–4.0)
MCH: 33.1 pg (ref 26.0–34.0)
MCHC: 33.5 g/dL (ref 30.0–36.0)
MCV: 98.9 fL (ref 80.0–100.0)
Monocytes Absolute: 0.8 10*3/uL (ref 0.1–1.0)
Monocytes Relative: 8 %
Neutro Abs: 5.7 10*3/uL (ref 1.7–7.7)
Neutrophils Relative %: 61 %
Platelets: 210 10*3/uL (ref 150–400)
RBC: 4.65 MIL/uL (ref 3.87–5.11)
RDW: 13.7 % (ref 11.5–15.5)
WBC: 9.4 10*3/uL (ref 4.0–10.5)
nRBC: 0 % (ref 0.0–0.2)

## 2022-03-21 MED ORDER — OXYCODONE-ACETAMINOPHEN 5-325 MG PO TABS: 1.0000 | ORAL_TABLET | Freq: Four times a day (QID) | ORAL | 0 refills | Status: DC | PRN

## 2022-03-21 MED ORDER — SODIUM CHLORIDE 0.9 % IV SOLN
3.0000 g | Freq: Once | INTRAVENOUS | Status: AC
  Administered 2022-03-21: 3 g via INTRAVENOUS
  Filled 2022-03-21: qty 8

## 2022-03-21 MED ORDER — ONDANSETRON HCL 4 MG PO TABS: 4.0000 mg | ORAL_TABLET | Freq: Three times a day (TID) | ORAL | 0 refills | Status: DC | PRN

## 2022-03-21 MED ORDER — AMOXICILLIN 500 MG PO CAPS: 500.0000 mg | ORAL_CAPSULE | Freq: Three times a day (TID) | ORAL | 0 refills | Status: DC

## 2022-03-21 MED ORDER — IOHEXOL 300 MG/ML  SOLN
75.0000 mL | Freq: Once | INTRAMUSCULAR | Status: AC | PRN
  Administered 2022-03-21: 75 mL via INTRAVENOUS

## 2022-03-21 MED ORDER — HYDROCODONE-ACETAMINOPHEN 5-325 MG PO TABS
1.0000 | ORAL_TABLET | Freq: Once | ORAL | Status: AC
  Administered 2022-03-21: 1 via ORAL
  Filled 2022-03-21: qty 1

## 2022-03-21 MED ORDER — ONDANSETRON HCL 4 MG/2ML IJ SOLN
4.0000 mg | Freq: Once | INTRAMUSCULAR | Status: AC
  Administered 2022-03-21: 4 mg via INTRAVENOUS
  Filled 2022-03-21: qty 2

## 2022-03-21 MED ORDER — AMOXICILLIN-POT CLAVULANATE 875-125 MG PO TABS: 1.0000 | ORAL_TABLET | Freq: Two times a day (BID) | ORAL | 0 refills | Status: DC

## 2022-03-21 NOTE — Discharge Instructions (Addendum)
Please return to the ED with any new symptoms such as fevers, inability to swallow, drooling, trouble breathing ?Please follow-up with Dr. Conley Simmonds in his office.  Please find the attached contact information.  Please call and make an appointment to be seen next week. ?Please pick up prescription pain medication, antinausea medication that I have sent in for you ?Please pick up antibiotics I have sent in for you ?Please read the attached informational guide concerning dental abscesses ?

## 2022-03-21 NOTE — ED Notes (Signed)
Pt o2 sat 88-90 on ra.  Pt has a hx of copd and normally sat high 80s per pt.  No sob noted.  No s/s of hypoxia. ?

## 2022-03-21 NOTE — ED Provider Notes (Signed)
?[facility] EMERGENCY DEPARTMENT ?Provider Note ? ? ?CSN: [id number] ?Arrival date & time: 03/21/22  1337 ? ?  ? ?History ? ?Chief Complaint  ?Patient presents with  ? Abscess  ? ? ?[name] is a 66 y.o. female with medical history significant for COPD, IBS, myelomalacia, PTSD, temporomandibular joint disorder.  Patient presents ED for evaluation of dental abscess.  Patient states that 3 weeks ago, she been experiencing dental pain located in the right upper side of her mouth.  Patient reports that at this time she was seen by PCP and placed on amoxicillin antibiotic therapy.  Patient reports that she took entire prescription of antibiotics to completion, had relief of pain and swelling for 4 days at which point swelling and pain returned.  Patient reports that she was seen at her dentist earlier today, and advised that she need to see an oral surgeon.  Patient states that due to her insurance, no oral surgeon will see her.  Patient reports that dentist office sent her to the ER for dental abscess.  Patient endorsing body aches, chills, nausea, swelling of face.  Patient denies any fevers, headache, neck pain, trouble swallowing, drooling. ? ? ?Abscess ?Associated symptoms: nausea   ?Associated symptoms: no fever, no headaches and no vomiting   ? ?  ? ?Home Medications ?Prior to Admission medications   ?Medication Sig Start Date End Date Taking? Authorizing Provider  ?amoxicillin (AMOXIL) 500 MG capsule Take 1 capsule (500 mg total) by mouth 3 (three) times daily. 03/21/22  Yes Al DecantGroce, Katherina Wimer F, PA-C  ?albuterol (PROVENTIL HFA;VENTOLIN HFA) 108 (90 Base) MCG/ACT inhaler Inhale 1-2 puffs into the lungs every 6 (six) hours as needed for wheezing or shortness of breath.    [provider]  ?albuterol (PROVENTIL) (2.5 MG/3ML) 0.083% nebulizer solution Take 3 mLs (2.5 mg total) by nebulization every 4 (four) hours as needed for wheezing or shortness of breath. 07/08/19   Mariea ClontsEmokpae, Courage, MD  ?Azelastine  HCl 137 MCG/SPRAY SOLN Place 1 spray into the nose daily as needed (for nasal congestion).  10/27/18   [provider]  ?baclofen (LIORESAL) 10 MG tablet Take 0.5 tablets (5 mg total) by mouth 2 (two) times daily. ?Patient taking differently: Take 10 mg by mouth 3 (three) times daily. 07/08/19   Shon HaleEmokpae, Courage, MD  ?budesonide-formoterol (SYMBICORT) 160-4.5 MCG/ACT inhaler Inhale 2 puffs into the lungs 2 (two) times daily.    [provider]  ?carboxymethylcellulose (REFRESH PLUS) 0.5 % SOLN Place 1 drop into both eyes 3 (three) times daily as needed.    [provider]  ?Cholecalciferol 50 MCG (2000 UT) CAPS Take 1 capsule by mouth daily at 12 noon.    [provider]  ?clonazePAM (KLONOPIN) 1 MG tablet Take 0.5 tablets (0.5 mg total) by mouth 2 (two) times daily as needed for anxiety. *May take one additional tablet as needed for anxiety/PTSD ?Patient taking differently: Take 1 mg by mouth every 6 (six) hours. 07/08/19   Shon HaleEmokpae, Courage, MD  ?fluticasone (FLONASE) 50 MCG/ACT nasal spray Place 2 sprays into both nostrils daily.    [provider]  ?gabapentin (NEURONTIN) 300 MG capsule Take 900 mg by mouth 3 (three) times daily. 900 mg twice a day and 600 mg once daily.    [provider]  ?HYDROcodone-acetaminophen (NORCO/VICODIN) 5-325 MG tablet Take 1 tablet by mouth every 6 (six) hours as needed. As needed for 1 day for car rides. Written by PCP Dr Bernette MayersS Kikel. #4 dispensed at  a time. 07/05/21   [provider]  ?ipratropium (ATROVENT) 0.03 % nasal spray Place 1 spray into both nostrils daily as needed for rhinitis.  ?Patient not taking: Reported on 10/15/2021 10/12/18   [provider]  ?MURO 128 5 % ophthalmic ointment Place 1 drop into both eyes at bedtime. ?Patient not taking: Reported on 10/15/2021 11/10/18   [provider]  ?neomycin-polymyxin-dexamethasone (MAXITROL) 0.1 % ophthalmic suspension Place 1 drop into the right eye 4  (four) times daily.    [provider]  ?ondansetron (ZOFRAN) 4 MG tablet Take 1 tablet (4 mg total) by mouth every 8 (eight) hours as needed for nausea or vomiting. 03/21/22   Al Decant, PA-C  ?oxyCODONE-acetaminophen (PERCOCET/ROXICET) 5-325 MG tablet Take 1 tablet by mouth every 6 (six) hours as needed for severe pain. 03/21/22   Al Decant, PA-C  ?pyridOXINE (VITAMIN B-6) 50 MG tablet Take 50 mg by mouth daily. 09/28/21   [provider]  ?traMADol (ULTRAM) 50 MG tablet Take 100 mg by mouth every 8 (eight) hours. 10/05/21   [provider]  ?   ? ?Allergies    ?Nsaids, Toradol [ketorolac tromethamine], Tramadol, Benadryl [diphenhydramine hcl], Ceftin [cefuroxime axetil], Compazine, and Epinephrine   ? ?Review of Systems   ?Review of Systems  ?Constitutional:  Positive for chills. Negative for fever.  ?HENT:  Positive for dental problem and facial swelling. Negative for drooling and trouble swallowing.   ?Gastrointestinal:  Positive for nausea. Negative for vomiting.  ?Musculoskeletal:  Positive for myalgias. Negative for neck pain.  ?Neurological:  Negative for headaches.  ?All other systems reviewed and are negative. ? ?Physical Exam ?Updated Vital Signs ?BP (!) 147/85   Pulse 73   Temp 98 ?F (36.7 ?C) (Oral)   Resp 17   SpO2 (!) 89%  ?Physical Exam ?Vitals and nursing note reviewed.  ?Constitutional:   ?   General: She is not in acute distress. ?   Appearance: Normal appearance. She is not ill-appearing, toxic-appearing or diaphoretic.  ?HENT:  ?   Head: Normocephalic and atraumatic.  ?   Nose: Nose normal. No congestion.  ?   Mouth/Throat:  ?   Mouth: Mucous membranes are moist.  ?   Dentition: Gingival swelling, dental caries and dental abscesses present.  ?   Pharynx: Uvula midline. Posterior oropharyngeal erythema present. No uvula swelling.  ?   Tonsils: 0 on the right. 0 on the left.  ?   Comments: Multiple dental caries throughout oral cavity.  Upper right  side of patient mouth does show signs of erythema, swelling however there is no appreciable abscess that I am able to drain in the emergency room setting.  Patient is in need of oral surgeon. ? ?Patient revealed midline.  Patient handling secretions appropriately. ?Eyes:  ?   Extraocular Movements: Extraocular movements intact.  ?   Conjunctiva/sclera: Conjunctivae normal.  ?   Pupils: Pupils are equal, round, and reactive to light.  ?Cardiovascular:  ?   Rate and Rhythm: Normal rate and regular rhythm.  ?Pulmonary:  ?   Effort: Pulmonary effort is normal. No respiratory distress.  ?   Breath sounds: Normal breath sounds. No wheezing.  ?Abdominal:  ?   General: Abdomen is flat. Bowel sounds are normal.  ?   Palpations: Abdomen is soft.  ?   Tenderness: There is no abdominal tenderness.  ?Musculoskeletal:  ?   Cervical back: Normal range of motion and neck supple. No tenderness.  ?Skin: ?  General: Skin is warm and dry.  ?   Capillary Refill: Capillary refill takes less than 2 seconds.  ?Neurological:  ?   Mental Status: She is alert and oriented to person, place, and time.  ? ? ?ED Results / Procedures / Treatments   ?Labs ?(all labs ordered are listed, but only abnormal results are displayed) ?Labs Reviewed  ?CBC WITH DIFFERENTIAL/PLATELET - Abnormal; Notable for the following components:  ?    Result Value  ? Hemoglobin 15.4 (*)   ? All other components within normal limits  ?BASIC METABOLIC PANEL  ? ? ?EKG ?None ? ?Radiology ?CT Maxillofacial W Contrast ? ?Result Date: 03/21/2022 ?CLINICAL DATA:  Abscess and pain of right upper jaw for 3 days EXAM: CT MAXILLOFACIAL WITH CONTRAST TECHNIQUE: Multidetector CT imaging of the maxillofacial structures was performed with intravenous contrast. Multiplanar CT image reconstructions were also generated. RADIATION DOSE REDUCTION: This exam was performed according to the departmental dose-optimization program which includes automated exposure control, adjustment of the mA  and/or kV according to patient size and/or use of iterative reconstruction technique. CONTRAST:  10mL OMNIPAQUE IOHEXOL 300 MG/ML  SOLN COMPARISON:  Maxillofacial CT 05/26/2021 FINDINGS: Osseous: There is no

## 2022-03-21 NOTE — ED Triage Notes (Signed)
Abscess right upper jaw ?

## 2022-04-09 ENCOUNTER — Other Ambulatory Visit: Payer: Self-pay

## 2022-04-09 ENCOUNTER — Emergency Department (HOSPITAL_COMMUNITY): Payer: Medicare Other

## 2022-04-09 ENCOUNTER — Encounter (HOSPITAL_COMMUNITY): Payer: Self-pay | Admitting: *Deleted

## 2022-04-09 ENCOUNTER — Emergency Department (HOSPITAL_COMMUNITY)
Admission: EM | Admit: 2022-04-09 | Discharge: 2022-04-09 | Disposition: A | Discharge status: Home / Self Care | Payer: Medicare Other | Attending: Student | Admitting: Student

## 2022-04-09 DIAGNOSIS — Z7951 Long term (current) use of inhaled steroids: Secondary | ICD-10-CM | POA: Diagnosis not present

## 2022-04-09 DIAGNOSIS — Z87891 Personal history of nicotine dependence: Secondary | ICD-10-CM | POA: Diagnosis not present

## 2022-04-09 DIAGNOSIS — J449 Chronic obstructive pulmonary disease, unspecified: Secondary | ICD-10-CM | POA: Diagnosis not present

## 2022-04-09 DIAGNOSIS — S82841A Displaced bimalleolar fracture of right lower leg, initial encounter for closed fracture: Secondary | ICD-10-CM | POA: Insufficient documentation

## 2022-04-09 DIAGNOSIS — S99911A Unspecified injury of right ankle, initial encounter: Secondary | ICD-10-CM | POA: Diagnosis present

## 2022-04-09 DIAGNOSIS — W06XXXA Fall from bed, initial encounter: Secondary | ICD-10-CM | POA: Diagnosis not present

## 2022-04-09 MED ORDER — HYDROCODONE-ACETAMINOPHEN 5-325 MG PO TABS: 2.0000 | ORAL_TABLET | ORAL | 0 refills | Status: DC | PRN

## 2022-04-09 MED ORDER — HYDROCODONE-ACETAMINOPHEN 5-325 MG PO TABS
1.0000 | ORAL_TABLET | Freq: Once | ORAL | Status: AC
  Administered 2022-04-09: 1 via ORAL
  Filled 2022-04-09: qty 1

## 2022-04-09 NOTE — ED Triage Notes (Signed)
Pt fell Monday morning after slipping off the edge of the bed.  Bruising to right ankle and up toward knee.  Unable to ambulate on her rt LE ?

## 2022-04-10 NOTE — ED Provider Notes (Signed)
?[facility] EMERGENCY DEPARTMENT ?Provider Note ? ?CSN: [id number] ?Arrival date & time: 04/09/22 1757 ? ?Chief Complaint(s) ?Fall ? ?HPI ?[name] is a 66 y.o. female with PMH cervical myelomalacia who presents the emergency department for evaluation of a fall and right ankle pain.  Patient states that she slipped out of bed and twisted her ankle.  She states that she felt immediate pain to the ankle and is currently unable to bear weight.  She arrives with significant bruising and swelling to the right ankle but pulses are intact.  Denies additional traumatic complaints. ? ? ?Past Medical History ?Past Medical History:  ?Diagnosis Date  ? Chronic pain   ? gneralized denies fibromyalgia  ? COPD (chronic obstructive pulmonary disease) (HCC)   ? Depression   ? Diverticulosis   ? IBS (irritable bowel syndrome)   ? Myelomalacia (HCC)   ? Panic disorder   ? Panic disorder   ? PTSD (post-traumatic stress disorder)   ? PTSD (post-traumatic stress disorder) 01/08/2019  ? Temporomandibular joint disorder (TMJ)   ? age 11 fractured TMJ - repaired by oral surgeon  ? ?Patient Active Problem List  ? Diagnosis Date Noted  ? Transverse myelitis (HCC) 07/17/2020  ? 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  ? Anxiety disorder 10/05/2019  ? Chronic post-traumatic stress disorder (PTSD) 10/05/2019  ? Chronic right shoulder pain 09/27/2019  ? Acute pain of left knee 09/27/2019  ? PNA (pneumonia) 07/07/2019  ? Acute Hypoxic Respiratory failure,  07/07/2019  ? 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 Leukocytosis 07/07/2019  ? Acute pancreatitis 07/06/2019  ? Oral candidiasis 02/02/2019  ? Perforated appendicitis 01/10/2019  ? Acute appendicitis 01/08/2019  ? ?Home Medication(s) ?Prior to Admission medications   ?Medication Sig Start Date End Date Taking? Authorizing Provider  ?HYDROcodone-acetaminophen (NORCO/VICODIN) 5-325 MG tablet Take 2 tablets by mouth every 4 (four) hours as needed. 04/09/22  Yes Zacarias Krauter, MD   ?albuterol (PROVENTIL HFA;VENTOLIN HFA) 108 (90 Base) MCG/ACT inhaler Inhale 1-2 puffs into the lungs every 6 (six) hours as needed for wheezing or shortness of breath.    [provider]  ?albuterol (PROVENTIL) (2.5 MG/3ML) 0.083% nebulizer solution Take 3 mLs (2.5 mg total) by nebulization every 4 (four) hours as needed for wheezing or shortness of breath. 07/08/19   Shon Hale, MD  ?amoxicillin (AMOXIL) 500 MG capsule Take 1 capsule (500 mg total) by mouth 3 (three) times daily. 03/21/22   Al Decant, PA-C  ?Azelastine HCl 137 MCG/SPRAY SOLN Place 1 spray into the nose daily as needed (for nasal congestion).  10/27/18   [provider]  ?baclofen (LIORESAL) 10 MG tablet Take 0.5 tablets (5 mg total) by mouth 2 (two) times daily. ?Patient taking differently: Take 10 mg by mouth 3 (three) times daily. 07/08/19   Shon Hale, MD  ?budesonide-formoterol (SYMBICORT) 160-4.5 MCG/ACT inhaler Inhale 2 puffs into the lungs 2 (two) times daily.    [provider]  ?carboxymethylcellulose (REFRESH PLUS) 0.5 % SOLN Place 1 drop into both eyes 3 (three) times daily as needed.    [provider]  ?Cholecalciferol 50 MCG (2000 UT) CAPS Take 1 capsule by mouth daily at 12 noon.    [provider]  ?clonazePAM (KLONOPIN) 1 MG tablet Take 0.5 tablets (0.5 mg total) by mouth 2 (two) times daily as needed for anxiety. *May take one additional tablet as needed for anxiety/PTSD ?Patient taking differently: Take 1 mg by mouth every 6 (six) hours. 07/08/19  Roxan Hockey, MD  ?fluticasone (FLONASE) 50 MCG/ACT nasal spray Place 2 sprays into both nostrils daily.    [provider]  ?gabapentin (NEURONTIN) 300 MG capsule Take 900 mg by mouth 3 (three) times daily. 900 mg twice a day and 600 mg once daily.    [provider]  ?HYDROcodone-acetaminophen (NORCO/VICODIN) 5-325 MG tablet Take 1 tablet by mouth every 6 (six) hours as needed. As needed for 1 day  for car rides. Written by PCP Dr Arliss Journey. #4 dispensed at a time. 07/05/21   [provider]  ?ipratropium (ATROVENT) 0.03 % nasal spray Place 1 spray into both nostrils daily as needed for rhinitis.  ?Patient not taking: Reported on 10/15/2021 10/12/18   [provider]  ?MURO 128 5 % ophthalmic ointment Place 1 drop into both eyes at bedtime. ?Patient not taking: Reported on 10/15/2021 11/10/18   [provider]  ?neomycin-polymyxin-dexamethasone (MAXITROL) 0.1 % ophthalmic suspension Place 1 drop into the right eye 4 (four) times daily.    [provider]  ?ondansetron (ZOFRAN) 4 MG tablet Take 1 tablet (4 mg total) by mouth every 8 (eight) hours as needed for nausea or vomiting. 03/21/22   Azucena Cecil, PA-C  ?oxyCODONE-acetaminophen (PERCOCET/ROXICET) 5-325 MG tablet Take 1 tablet by mouth every 6 (six) hours as needed for severe pain. 03/21/22   Azucena Cecil, PA-C  ?pyridOXINE (VITAMIN B-6) 50 MG tablet Take 50 mg by mouth daily. 09/28/21   [provider]  ?traMADol (ULTRAM) 50 MG tablet Take 100 mg by mouth every 8 (eight) hours. 10/05/21   [provider]  ?                                                                                                                                  ?Past Surgical History ?Past Surgical History:  ?Procedure Laterality Date  ? CHOLECYSTECTOMY    ? EYE SURGERY    ? FRACTURE SURGERY    ? LAPAROSCOPIC APPENDECTOMY N/A 01/08/2019  ? Procedure: APPENDECTOMY LAPAROSCOPIC;  Surgeon: Virl Cagey, MD;  Location: [facility];  Service: General;  Laterality: N/A;  ? ?Family History ?Family History  ?Problem Relation Age of Onset  ? Polymyalgia rheumatica Mother   ? Migraines Mother   ? Coronary artery disease Father   ? Breast cancer Neg Hx   ? ? ?Social History ?Social History  ? ?Tobacco Use  ? Smoking status: Former  ?  Packs/day: 0.50  ?  Types: Cigarettes  ?  Quit date: 08/04/2020  ?  Years since quitting: 1.6  ?  Smokeless tobacco: Never  ?Vaping Use  ? Vaping Use: Never used  ?Substance Use Topics  ? Alcohol use: No  ? Drug use: No  ? ?Allergies ?Nsaids, Toradol [ketorolac tromethamine], Tramadol, Benadryl [diphenhydramine hcl], Ceftin [cefuroxime axetil], Compazine, and Epinephrine ? ?Review of Systems ?Review of Systems  ?Musculoskeletal:  Positive for myalgias.  ? ?Physical  Exam ?Vital Signs  ?I have reviewed the triage vital signs ?BP 127/89   Pulse 74   Temp 98 ?F (36.7 ?C) (Oral)   Resp 19   Ht 5\' 1"  (1.549 m)   Wt 40.8 kg   SpO2 99%   BMI 17.01 kg/m?  ? ?Physical Exam ?Vitals and nursing note reviewed.  ?Constitutional:   ?   General: She is not in acute distress. ?   Appearance: She is well-developed.  ?HENT:  ?   Head: Normocephalic and atraumatic.  ?Eyes:  ?   Conjunctiva/sclera: Conjunctivae normal.  ?Cardiovascular:  ?   Rate and Rhythm: Normal rate and regular rhythm.  ?   Heart sounds: No murmur heard. ?Pulmonary:  ?   Effort: Pulmonary effort is normal. No respiratory distress.  ?   Breath sounds: Normal breath sounds.  ?Abdominal:  ?   Palpations: Abdomen is soft.  ?   Tenderness: There is no abdominal tenderness.  ?Musculoskeletal:     ?   General: Swelling, tenderness and deformity present.  ?   Cervical back: Neck supple.  ?Skin: ?   General: Skin is warm and dry.  ?   Capillary Refill: Capillary refill takes less than 2 seconds.  ?Neurological:  ?   Mental Status: She is alert.  ?Psychiatric:     ?   Mood and Affect: Mood normal.  ? ? ?ED Results and Treatments ?Labs ?(all labs ordered are listed, but only abnormal results are displayed) ?Labs Reviewed - No data to display                                                                                                                       ? ?Radiology ?DG Tibia/Fibula Right ? ?Result Date: 04/09/2022 ?CLINICAL DATA:  Fall out of bed on Monday. Swelling and bruising to right lower leg. EXAM: RIGHT TIBIA AND FIBULA - 2 VIEW COMPARISON:  Concurrent  ankle exam, reported separately. FINDINGS: Distal tibia and fibular fractures better assessed on concurrent ankle exam. There is no fracture of the proximal tibia or fibula. Knee alignment is maintained. The bones

## 2022-04-19 ENCOUNTER — Ambulatory Visit
Admission: RE | Admit: 2022-04-19 | Discharge: 2022-04-19 | Disposition: A | Discharge status: Home / Self Care | Payer: Medicare Other | Source: Ambulatory Visit | Attending: Orthopaedic Surgery | Admitting: Orthopaedic Surgery

## 2022-04-19 ENCOUNTER — Other Ambulatory Visit: Payer: Self-pay | Admitting: Orthopaedic Surgery

## 2022-04-19 DIAGNOSIS — M79671 Pain in right foot: Secondary | ICD-10-CM

## 2022-04-22 ENCOUNTER — Ambulatory Visit
Admission: RE | Admit: 2022-04-22 | Discharge: 2022-04-22 | Disposition: A | Discharge status: Home / Self Care | Payer: Medicare Other | Source: Ambulatory Visit | Attending: Orthopaedic Surgery | Admitting: Orthopaedic Surgery

## 2022-04-22 DIAGNOSIS — M79671 Pain in right foot: Secondary | ICD-10-CM

## 2022-04-24 ENCOUNTER — Encounter (HOSPITAL_COMMUNITY): Payer: Self-pay | Admitting: Emergency Medicine

## 2022-04-24 ENCOUNTER — Other Ambulatory Visit: Payer: Self-pay

## 2022-04-24 ENCOUNTER — Encounter (HOSPITAL_COMMUNITY): Payer: Self-pay | Admitting: Certified Registered Nurse Anesthetist

## 2022-04-24 ENCOUNTER — Encounter (HOSPITAL_COMMUNITY)
Admission: AD | Disposition: A | Discharge status: Home / Self Care | Payer: Self-pay | Source: Ambulatory Visit | Attending: Family Medicine

## 2022-04-24 ENCOUNTER — Ambulatory Visit (HOSPITAL_COMMUNITY): Payer: Medicare Other

## 2022-04-24 ENCOUNTER — Inpatient Hospital Stay (HOSPITAL_COMMUNITY)
Admission: AD | Admit: 2022-04-24 | Discharge: 2022-04-30 | DRG: 981 | Disposition: A | Discharge status: Home / Self Care | Payer: Medicare Other | Source: Ambulatory Visit | Attending: Family Medicine | Admitting: Family Medicine

## 2022-04-24 DIAGNOSIS — Z885 Allergy status to narcotic agent status: Secondary | ICD-10-CM

## 2022-04-24 DIAGNOSIS — J439 Emphysema, unspecified: Secondary | ICD-10-CM | POA: Diagnosis not present

## 2022-04-24 DIAGNOSIS — S82891A Other fracture of right lower leg, initial encounter for closed fracture: Secondary | ICD-10-CM | POA: Diagnosis present

## 2022-04-24 DIAGNOSIS — Z720 Tobacco use: Secondary | ICD-10-CM

## 2022-04-24 DIAGNOSIS — K5790 Diverticulosis of intestine, part unspecified, without perforation or abscess without bleeding: Secondary | ICD-10-CM | POA: Diagnosis present

## 2022-04-24 DIAGNOSIS — G9589 Other specified diseases of spinal cord: Secondary | ICD-10-CM | POA: Diagnosis present

## 2022-04-24 DIAGNOSIS — J9601 Acute respiratory failure with hypoxia: Secondary | ICD-10-CM | POA: Diagnosis not present

## 2022-04-24 DIAGNOSIS — R0902 Hypoxemia: Principal | ICD-10-CM

## 2022-04-24 DIAGNOSIS — F32A Depression, unspecified: Secondary | ICD-10-CM | POA: Diagnosis present

## 2022-04-24 DIAGNOSIS — Z8249 Family history of ischemic heart disease and other diseases of the circulatory system: Secondary | ICD-10-CM

## 2022-04-24 DIAGNOSIS — E876 Hypokalemia: Secondary | ICD-10-CM | POA: Diagnosis not present

## 2022-04-24 DIAGNOSIS — J9621 Acute and chronic respiratory failure with hypoxia: Principal | ICD-10-CM | POA: Diagnosis present

## 2022-04-24 DIAGNOSIS — F4310 Post-traumatic stress disorder, unspecified: Secondary | ICD-10-CM | POA: Diagnosis present

## 2022-04-24 DIAGNOSIS — F410 Panic disorder [episodic paroxysmal anxiety] without agoraphobia: Secondary | ICD-10-CM | POA: Diagnosis present

## 2022-04-24 DIAGNOSIS — Z7951 Long term (current) use of inhaled steroids: Secondary | ICD-10-CM

## 2022-04-24 DIAGNOSIS — E43 Unspecified severe protein-calorie malnutrition: Secondary | ICD-10-CM | POA: Diagnosis present

## 2022-04-24 DIAGNOSIS — S82841A Displaced bimalleolar fracture of right lower leg, initial encounter for closed fracture: Secondary | ICD-10-CM | POA: Diagnosis present

## 2022-04-24 DIAGNOSIS — Z880 Allergy status to penicillin: Secondary | ICD-10-CM

## 2022-04-24 DIAGNOSIS — Z7982 Long term (current) use of aspirin: Secondary | ICD-10-CM

## 2022-04-24 DIAGNOSIS — Z20822 Contact with and (suspected) exposure to covid-19: Secondary | ICD-10-CM | POA: Diagnosis present

## 2022-04-24 DIAGNOSIS — S82891G Other fracture of right lower leg, subsequent encounter for closed fracture with delayed healing: Secondary | ICD-10-CM

## 2022-04-24 DIAGNOSIS — Z681 Body mass index (BMI) 19 or less, adult: Secondary | ICD-10-CM

## 2022-04-24 DIAGNOSIS — Z888 Allergy status to other drugs, medicaments and biological substances status: Secondary | ICD-10-CM

## 2022-04-24 DIAGNOSIS — F17210 Nicotine dependence, cigarettes, uncomplicated: Secondary | ICD-10-CM

## 2022-04-24 DIAGNOSIS — Z79899 Other long term (current) drug therapy: Secondary | ICD-10-CM

## 2022-04-24 DIAGNOSIS — G4733 Obstructive sleep apnea (adult) (pediatric): Secondary | ICD-10-CM | POA: Diagnosis present

## 2022-04-24 DIAGNOSIS — Z87891 Personal history of nicotine dependence: Secondary | ICD-10-CM

## 2022-04-24 HISTORY — DX: Dyspnea, unspecified: R06.00

## 2022-04-24 LAB — CBC WITH DIFFERENTIAL/PLATELET
Abs Immature Granulocytes: 0.04 10*3/uL (ref 0.00–0.07)
Basophils Absolute: 0 10*3/uL (ref 0.0–0.1)
Basophils Relative: 0 %
Eosinophils Absolute: 0.2 10*3/uL (ref 0.0–0.5)
Eosinophils Relative: 3 %
HCT: 45.2 % (ref 36.0–46.0)
Hemoglobin: 14.9 g/dL (ref 12.0–15.0)
Immature Granulocytes: 0 %
Lymphocytes Relative: 25 %
Lymphs Abs: 2.2 10*3/uL (ref 0.7–4.0)
MCH: 33.6 pg (ref 26.0–34.0)
MCHC: 33 g/dL (ref 30.0–36.0)
MCV: 102 fL — ABNORMAL HIGH (ref 80.0–100.0)
Monocytes Absolute: 0.6 10*3/uL (ref 0.1–1.0)
Monocytes Relative: 7 %
Neutro Abs: 5.9 10*3/uL (ref 1.7–7.7)
Neutrophils Relative %: 65 %
Platelets: 243 10*3/uL (ref 150–400)
RBC: 4.43 MIL/uL (ref 3.87–5.11)
RDW: 14.2 % (ref 11.5–15.5)
WBC: 9.1 10*3/uL (ref 4.0–10.5)
nRBC: 0 % (ref 0.0–0.2)

## 2022-04-24 LAB — TROPONIN I (HIGH SENSITIVITY)
Troponin I (High Sensitivity): 3 ng/L (ref <18)
Troponin I (High Sensitivity): 3 ng/L (ref <18)

## 2022-04-24 LAB — BASIC METABOLIC PANEL
Anion gap: 5 (ref 5–15)
BUN: 15 mg/dL (ref 8–23)
CO2: 28 mmol/L (ref 22–32)
Calcium: 8.4 mg/dL — ABNORMAL LOW (ref 8.9–10.3)
Chloride: 111 mmol/L (ref 98–111)
Creatinine, Ser: 0.51 mg/dL (ref 0.44–1.00)
GFR, Estimated: >60 mL/min (ref >60)
Glucose, Bld: 87 mg/dL (ref 70–99)
Potassium: 3.9 mmol/L (ref 3.5–5.1)
Sodium: 144 mmol/L (ref 135–145)

## 2022-04-24 LAB — RESP PANEL BY RT-PCR (FLU A&B, COVID) ARPGX2
Influenza A by PCR: NEGATIVE
Influenza B by PCR: NEGATIVE
SARS Coronavirus 2 by RT PCR: NEGATIVE

## 2022-04-24 LAB — BRAIN NATRIURETIC PEPTIDE: B Natriuretic Peptide: 53.2 pg/mL (ref 0.0–100.0)

## 2022-04-24 SURGERY — OPEN REDUCTION INTERNAL FIXATION (ORIF) ANKLE FRACTURE
Anesthesia: Choice | Site: Ankle | Laterality: Right

## 2022-04-24 MED ORDER — IPRATROPIUM-ALBUTEROL 0.5-2.5 (3) MG/3ML IN SOLN
3.0000 mL | Freq: Once | RESPIRATORY_TRACT | Status: AC
  Administered 2022-04-24: 3 mL via RESPIRATORY_TRACT
  Filled 2022-04-24: qty 3

## 2022-04-24 MED ORDER — MORPHINE SULFATE (PF) 4 MG/ML IV SOLN
4.0000 mg | Freq: Once | INTRAVENOUS | Status: AC
  Administered 2022-04-24: 4 mg via INTRAVENOUS
  Filled 2022-04-24: qty 1

## 2022-04-24 MED ORDER — GABAPENTIN 300 MG PO CAPS
600.0000 mg | ORAL_CAPSULE | Freq: Every day | ORAL | Status: DC
  Administered 2022-04-25 – 2022-04-30 (×5): 600 mg via ORAL
  Filled 2022-04-24 (×6): qty 2

## 2022-04-24 MED ORDER — LACTATED RINGERS IV SOLN: INTRAVENOUS | Status: DC

## 2022-04-24 MED ORDER — GABAPENTIN 300 MG PO CAPS
900.0000 mg | ORAL_CAPSULE | Freq: Two times a day (BID) | ORAL | Status: DC
  Administered 2022-04-24 – 2022-04-30 (×11): 900 mg via ORAL
  Filled 2022-04-24 (×11): qty 3

## 2022-04-24 MED ORDER — FENTANYL CITRATE PF 50 MCG/ML IJ SOSY: 50.0000 ug | PREFILLED_SYRINGE | INTRAMUSCULAR | Status: DC

## 2022-04-24 MED ORDER — CHLORHEXIDINE GLUCONATE 0.12 % MT SOLN: 15.0000 mL | Freq: Once | OROMUCOSAL | Status: DC

## 2022-04-24 MED ORDER — SODIUM CHLORIDE 0.9 % IV SOLN
75.0000 mL/h | INTRAVENOUS | Status: AC
  Administered 2022-04-24: 75 mL/h via INTRAVENOUS

## 2022-04-24 MED ORDER — ALBUTEROL SULFATE (2.5 MG/3ML) 0.083% IN NEBU
2.5000 mg | INHALATION_SOLUTION | RESPIRATORY_TRACT | Status: DC | PRN
  Administered 2022-04-26: 2.5 mg via RESPIRATORY_TRACT
  Filled 2022-04-24: qty 3

## 2022-04-24 MED ORDER — ONDANSETRON HCL 4 MG/2ML IJ SOLN
4.0000 mg | Freq: Once | INTRAMUSCULAR | Status: AC
  Administered 2022-04-24: 4 mg via INTRAVENOUS
  Filled 2022-04-24: qty 2

## 2022-04-24 MED ORDER — MOMETASONE FURO-FORMOTEROL FUM 200-5 MCG/ACT IN AERO
2.0000 | INHALATION_SPRAY | Freq: Two times a day (BID) | RESPIRATORY_TRACT | Status: DC
  Administered 2022-04-25 – 2022-04-30 (×9): 2 via RESPIRATORY_TRACT
  Filled 2022-04-24: qty 8.8

## 2022-04-24 MED ORDER — OXYCODONE-ACETAMINOPHEN 5-325 MG PO TABS
1.0000 | ORAL_TABLET | Freq: Once | ORAL | Status: AC
  Administered 2022-04-24: 1 via ORAL
  Filled 2022-04-24: qty 1

## 2022-04-24 MED ORDER — ORAL CARE MOUTH RINSE: 15.0000 mL | Freq: Once | OROMUCOSAL | Status: DC

## 2022-04-24 MED ORDER — MIDAZOLAM HCL 2 MG/2ML IJ SOLN: 1.0000 mg | INTRAMUSCULAR | Status: DC

## 2022-04-24 MED ORDER — IOHEXOL 350 MG/ML SOLN
75.0000 mL | Freq: Once | INTRAVENOUS | Status: AC | PRN
  Administered 2022-04-24: 75 mL via INTRAVENOUS

## 2022-04-24 MED ORDER — HYDROCODONE-ACETAMINOPHEN 5-325 MG PO TABS
1.0000 | ORAL_TABLET | Freq: Four times a day (QID) | ORAL | Status: DC | PRN
  Administered 2022-04-24 – 2022-04-28 (×8): 1 via ORAL
  Filled 2022-04-24 (×8): qty 1

## 2022-04-24 MED ORDER — NICOTINE 21 MG/24HR TD PT24
21.0000 mg | MEDICATED_PATCH | Freq: Every day | TRANSDERMAL | Status: DC
  Administered 2022-04-24 – 2022-04-30 (×7): 21 mg via TRANSDERMAL
  Filled 2022-04-24 (×7): qty 1

## 2022-04-24 MED ORDER — ACETAMINOPHEN 650 MG RE SUPP: 650.0000 mg | Freq: Four times a day (QID) | RECTAL | Status: DC | PRN

## 2022-04-24 MED ORDER — ACETAMINOPHEN 325 MG PO TABS
650.0000 mg | ORAL_TABLET | Freq: Four times a day (QID) | ORAL | Status: DC | PRN
  Administered 2022-04-27 – 2022-04-30 (×7): 650 mg via ORAL
  Filled 2022-04-24 (×7): qty 2

## 2022-04-24 MED ORDER — HYDROMORPHONE HCL 1 MG/ML IJ SOLN
0.5000 mg | INTRAMUSCULAR | Status: DC | PRN
  Administered 2022-04-25 – 2022-04-30 (×22): 1 mg via INTRAVENOUS
  Filled 2022-04-24 (×22): qty 1

## 2022-04-24 SURGICAL SUPPLY — 39 items
BAG COUNTER SPONGE SURGICOUNT (BAG) IMPLANT
BAG ZIPLOCK 12X15 (MISCELLANEOUS) ×2 IMPLANT
BLADE SURG 15 STRL LF DISP TIS (BLADE) ×2 IMPLANT
BLADE SURG 15 STRL SS (BLADE) ×2
BNDG ELASTIC 4X5.8 VLCR STR LF (GAUZE/BANDAGES/DRESSINGS) ×2 IMPLANT
BNDG ELASTIC 6X5.8 VLCR STR LF (GAUZE/BANDAGES/DRESSINGS) ×2 IMPLANT
BNDG GAUZE ELAST 4 BULKY (GAUZE/BANDAGES/DRESSINGS) ×2 IMPLANT
CHLORAPREP W/TINT 26 (MISCELLANEOUS) ×4 IMPLANT
COVER SURGICAL LIGHT HANDLE (MISCELLANEOUS) ×2 IMPLANT
CUFF TOURN SGL QUICK 34 (TOURNIQUET CUFF) ×1
CUFF TRNQT CYL 34X4.125X (TOURNIQUET CUFF) ×1 IMPLANT
DRAPE C-ARM 42X120 X-RAY (DRAPES) IMPLANT
DRAPE C-ARMOR (DRAPES) ×2 IMPLANT
DRAPE EXTREMITY T 121X128X90 (DISPOSABLE) ×2 IMPLANT
DRAPE U-SHAPE 47X51 STRL (DRAPES) ×2 IMPLANT
DRSG PAD ABDOMINAL 8X10 ST (GAUZE/BANDAGES/DRESSINGS) ×10 IMPLANT
ELECT REM PT RETURN 15FT ADLT (MISCELLANEOUS) ×2 IMPLANT
GAUZE SPONGE 4X4 12PLY STRL (GAUZE/BANDAGES/DRESSINGS) ×4 IMPLANT
GAUZE XEROFORM 1X8 LF (GAUZE/BANDAGES/DRESSINGS) ×2 IMPLANT
GLOVE BIOGEL PI IND STRL 7.5 (GLOVE) ×2 IMPLANT
GLOVE BIOGEL PI INDICATOR 7.5 (GLOVE) ×2
GLOVE SURG UNDER LTX SZ7.5 (GLOVE) ×2 IMPLANT
GOWN STRL REUS W/ TWL LRG LVL3 (GOWN DISPOSABLE) ×1 IMPLANT
GOWN STRL REUS W/TWL LRG LVL3 (GOWN DISPOSABLE) ×1
KIT BASIN OR (CUSTOM PROCEDURE TRAY) ×2 IMPLANT
KIT TURNOVER KIT A (KITS) IMPLANT
NS IRRIG 1000ML POUR BTL (IV SOLUTION) ×2 IMPLANT
PACK TOTAL JOINT (CUSTOM PROCEDURE TRAY) ×2 IMPLANT
PROTECTOR NERVE ULNAR (MISCELLANEOUS) ×2 IMPLANT
SPONGE T-LAP 18X18 ~~LOC~~+RFID (SPONGE) ×2 IMPLANT
STOCKINETTE 4X48 STRL (DRAPES) ×2 IMPLANT
SUCTION FRAZIER HANDLE 10FR (MISCELLANEOUS) ×1
SUCTION TUBE FRAZIER 10FR DISP (MISCELLANEOUS) ×1 IMPLANT
SUT ETHILON 3 0 PS 1 (SUTURE) ×4 IMPLANT
SUT VIC AB 2-0 SH 27 (SUTURE) ×1
SUT VIC AB 2-0 SH 27XBRD (SUTURE) ×1 IMPLANT
SUT VIC AB 3-0 SH 27 (SUTURE) ×2
SUT VIC AB 3-0 SH 27X BRD (SUTURE) ×2 IMPLANT
WATER STERILE IRR 1000ML POUR (IV SOLUTION) ×2 IMPLANT

## 2022-04-24 NOTE — Assessment & Plan Note (Signed)
-   Spoke about importance of quitting spent 5 minutes discussing options for treatment, prior attempts at quitting, and dangers of smoking ? -At this point patient is    interested in quitting ? - order nicotine patch  ? - nursing tobacco cessation protocol ? ?

## 2022-04-24 NOTE — ED Provider Notes (Signed)
? ?Emergency Department Provider Note ? ? ?I have reviewed the triage vital signs and the nursing notes. ? ? ?HISTORY ? ?Chief Complaint ?Sent from Surgery for admission  ? ? ?HPI ?[name] is a 66 y.o. female past history of COPD, not on home O2, presents to the emergency department from preop with low oxygen without symptoms.  Patient notes prior low oxygen in the past.  She is not feeling short of breath, having cough, wheezing, chest discomfort.  No fevers or chills.  She had a fall and broke her right ankle in late April and has been coordinating with [facility] for repair.  Plan was for ORIF today but during her preop stay she was found to be hypoxemic into the mid 80s with no distress.  The surgery was canceled and patient referred to the emergency department with plan for medical admit/clearance and hopefully can proceed with the surgery afterwards. ? ? ?Past Medical History:  ?Diagnosis Date  ? Chronic pain   ? gneralized denies fibromyalgia  ? COPD (chronic obstructive pulmonary disease) (HCC)   ? Depression   ? Diverticulosis   ? IBS (irritable bowel syndrome)   ? Myelomalacia (HCC)   ? Panic disorder   ? Panic disorder   ? PTSD (post-traumatic stress disorder)   ? PTSD (post-traumatic stress disorder) 01/08/2019  ? Temporomandibular joint disorder (TMJ)   ? age 66 fractured TMJ - repaired by oral surgeon  ? ? ?Review of Systems ? ?Constitutional: No fever/chills ?Eyes: No visual changes. ?ENT: No sore throat. ?Cardiovascular: Denies chest pain. ?Respiratory: Denies shortness of breath. ?Gastrointestinal: No abdominal pain.  No nausea, no vomiting.  No diarrhea.  No constipation. ?Genitourinary: Negative for dysuria. ?Musculoskeletal: Negative for back pain. ?Skin: Negative for rash. ?Neurological: Negative for headaches, focal weakness or numbness. ? ? ?____________________________________________ ? ? ?PHYSICAL EXAM: ? ?VITAL SIGNS: ?ED Triage Vitals  ?Enc Vitals Group  ?   BP 04/24/22 1217 (!)  153/67  ?   Pulse Rate 04/24/22 1217 (!) 55  ?   Resp 04/24/22 1217 15  ?   Temp 04/24/22 1217 98.6 ?F (37 ?C)  ?   Temp Source 04/24/22 1217 Oral  ?   SpO2 04/24/22 1217 (!) 88 %  ?   Weight 04/24/22 1217 90 lb (40.8 kg)  ? ?Constitutional: Alert and oriented. Well appearing and in no acute distress. ?Eyes: Conjunctivae are normal.  ?Head: Atraumatic. ?Nose: No congestion/rhinnorhea. ?Mouth/Throat: Mucous membranes are moist.  ?Neck: No stridor.  ?Cardiovascular: Normal rate, regular rhythm. Good peripheral circulation. Grossly normal heart sounds.   ?Respiratory: Normal respiratory effort.  No retractions. Lungs CTAB. ?Gastrointestinal: Soft and nontender. No distention.  ?Musculoskeletal: No gross deformities of extremities. ?Neurologic:  Normal speech and language.  ?Skin:  Skin is warm, dry and intact. No rash noted. ? ? ?____________________________________________ ?  ?LABS ?(all labs ordered are listed, but only abnormal results are displayed) ? ?Labs Reviewed  ?BASIC METABOLIC PANEL - Abnormal; Notable for the following components:  ?    Result Value  ? Calcium 8.4 (*)   ? All other components within normal limits  ?CBC WITH DIFFERENTIAL/PLATELET - Abnormal; Notable for the following components:  ? MCV 102.0 (*)   ? All other components within normal limits  ?RESP PANEL BY RT-PCR (FLU A&B, COVID) [id number]  ?BRAIN NATRIURETIC PEPTIDE  ?TROPONIN I (HIGH SENSITIVITY)  ?TROPONIN I (HIGH SENSITIVITY)  ? ?____________________________________________ ? ?EKG ? ? EKG Interpretation ? ?Date/Time:  Wednesday Apr 24 2022 16:17:54  EDT ?Ventricular Rate:  46 ?PR Interval:  141 ?QRS Duration: 96 ?QT Interval:  457 ?QTC Calculation: 400 ?R Axis:   89 ?Text Interpretation: Sinus bradycardia Borderline right axis deviation Nonspecific T abnrm, anterolateral leads  Nonspecific ST changes Confirmed by Alona Bene [phone]) on 04/24/2022 4:26:33 PM ?  ? ?  ? ? ?____________________________________________ ? ?RADIOLOGY ? ?DG Chest 2  View ? ?Result Date: 04/24/2022 ?CLINICAL DATA:  Hypoxia EXAM: CHEST - 2 VIEW COMPARISON:  07/07/2019 FINDINGS: The heart size and mediastinal contours are within normal limits. Pulmonary hyperinflation and emphysema. The visualized skeletal structures are unremarkable. IMPRESSION: Pulmonary hyperinflation and emphysema without acute abnormality of the lungs. Electronically Signed   By: Jearld Lesch M.D.   On: 04/24/2022 16:43   ? ?____________________________________________ ? ? ?PROCEDURES ? ?Procedure(s) performed:  ? ?Procedures ? ?CRITICAL CARE ?Performed by: Maia Plan ?Total critical care time: 35 minutes ?Critical care time was exclusive of separately billable procedures and treating other patients. ?Critical care was necessary to treat or prevent imminent or life-threatening deterioration. ?Critical care was time spent personally by me on the following activities: development of treatment plan with patient and/or surrogate as well as nursing, discussions with consultants, evaluation of patient's response to treatment, examination of patient, obtaining history from patient or surrogate, ordering and performing treatments and interventions, ordering and review of laboratory studies, ordering and review of radiographic studies, pulse oximetry and re-evaluation of patient's condition. ? ?Alona Bene, MD ?Emergency Medicine ? ?____________________________________________ ? ? ?INITIAL IMPRESSION / ASSESSMENT AND PLAN / ED COURSE ? ?Pertinent labs & imaging results that were available during my care of the patient were reviewed by me and considered in my medical decision making (see chart for details). ?  ?This patient is Presenting for Evaluation of low O2, which does require a range of treatment options, and is a complaint that involves a high risk of morbidity and mortality. ? ?The Differential Diagnoses include chronic respiratory failure with COPD history, CHF, ACS, PE, viral process, CAP. ? ?Critical  Interventions-  ?  ?Medications  ?oxyCODONE-acetaminophen (PERCOCET/ROXICET) 5-325 MG per tablet 1 tablet (1 tablet Oral Given 04/24/22 1646)  ?ipratropium-albuterol (DUONEB) 0.5-2.5 (3) MG/3ML nebulizer solution 3 mL (3 mLs Nebulization Given 04/24/22 1647)  ?morphine (PF) 4 MG/ML injection 4 mg (4 mg Intravenous Given 04/24/22 1759)  ?ondansetron (ZOFRAN) injection 4 mg (4 mg Intravenous Given 04/24/22 1759)  ? ? ?Reassessment after intervention: Continuing to feel well.  ? ? ?I did obtain Additional Historical Information from husband. ? ?I decided to review pertinent External Data, and in summary patient with pre-op visit and procedure pass this AM. ?  ?Clinical Laboratory Tests Ordered, included COVID and flu were negative.  No leukocytosis or anemia.  Creatinine is normal.  Troponin normal. ? ?Radiologic Tests Ordered, included CXR. I independently interpreted the images and agree with radiology interpretation.  ? ?Cardiac Monitor Tracing which shows NSR. ? ? ?Social Determinants of Health Risk patient with a smoking history.  ? ?Consult complete with Hospitalist, Dr. Adela Glimpse. Plan for CTA PE and will admit. I have ordered this study.  ? ?Medical Decision Making: Summary:  ?Patient presents emergency department for evaluation after being hypoxemic in preop.  She is not having any symptoms.  Considered PE is a possibility given her recent fracture but vital signs are within normal limits other than oxygenation is not having any symptoms of shortness of breath or chest discomfort.  Plan for DuoNeb, screening blood work, chest x-ray, COVID/flu and admit  to the hospitalist service for medical clearance.  ? ?Reevaluation with update and discussion with patient. Plan for CTA and admit.  ? ?Disposition: admit ? ?____________________________________________ ? ?FINAL CLINICAL IMPRESSION(S) / ED DIAGNOSES ? ?Final diagnoses:  ?Hypoxemia  ?Pulmonary emphysema, unspecified emphysema type (HCC)  ? ? ?Note:  This document was  prepared using Dragon voice recognition software and may include unintentional dictation errors. ? ?Alona Bene, MD, FACEP ?Emergency Medicine ? ?  ?Maia Plan, MD ?04/24/22 1830 ? ?

## 2022-04-24 NOTE — Assessment & Plan Note (Signed)
Orthopedics is aware, ?Keep NPO post midnight ? Orthopedics will see  ? - management as per orthopedics,  plan to operate tomorrow if able ? Keep nothing by mouth post midnight. ?Patient not on anticoagulation  ? antiplatelet agents  on hold ?Ordered type and screen,  ? ?Patient at baseline able to walk a flight of stairs or 100 feet  ?  Patient denies any chest pain or shortness of breath currently and/or with exertion, ?  ECG showing no evidence of acute ischemia ?  known history of COPD  ? ?Given advanced age patient is at least moderate  Risk  at this point no furthther cardiac workup is indicated. ? ?  given severe Pulmonary disease would defere to anesthesia/ pulmonology for pre-op clearance ?  ? ? ? ?

## 2022-04-24 NOTE — Assessment & Plan Note (Addendum)
likley cause for hypoxia ?Stable continue home meds ?

## 2022-04-24 NOTE — Subjective & Objective (Signed)
Patient with a history of COPD not on home oxygen although she has been noted to have low oxygen in the past did not have any chest pain or shortness of breath she had a recent ankle injury and was supposed to undergo operative intervention by [facility] for repair was supposed to undergo ORIF today but during her preop evaluation was found to be hypoxic down to mid 80s on room air surgery was canceled and she was sent to emergency department for further evaluation. ?

## 2022-04-24 NOTE — ED Triage Notes (Addendum)
Pt broke her ankle 2 weeks ago. Pt was gonna have ankle surgery but unable to do surgery due to low 02's.  ? ?90% on 2 liters.  ? ?Pt has palliative care for COPD.  ?

## 2022-04-24 NOTE — Progress Notes (Signed)
Pt to be admitted to ED for internal medicine evaulation per Dr. Ola Spurr. Husband made aware. ?

## 2022-04-24 NOTE — H&P (Addendum)
? ? ?[name] DQQ:[id number] DOB: 02/28/1956 DOA: 04/24/2022 ?  ?PCP: Smith Robert, MD   ?Outpatient Specialists:    ?  ?[facility]  at [facility] ? orthopedics ? ?Patient arrived to ER on 04/24/22 at 1434 ?Referred by Attending Therisa Doyne, MD ? ? ?Patient coming from:   ? home Lives With family ?   ?Chief Complaint:   ?Chief Complaint  ?Patient presents with  ? Sent from Surgery for admission   ? ? ?HPI: ?[name] is a 66 y.o. female with medical history significant of COPD not on O2 at home, Myelomalacia ? ?Presented with   hypoxia ?Patient with a history of COPD not on home oxygen although she has been noted to have low oxygen in the past did not have any chest pain or shortness of breath she had a recent ankle injury and was supposed to undergo operative intervention by [facility] for repair was supposed to undergo ORIF today but during her preop evaluation was found to be hypoxic down to mid 80s on room air surgery was canceled and she was sent to emergency department for further evaluation. ? Pt states she always runs mid 80% when they check her rarely she goes up to mid 90% ?Pt had a fall on 24th of April resulting in right ankle fracture ?Smokes a pack a day, no EtOH ?Uses a walker ? She run out hydrocodone yesterday ?  ? Initial COVID TEST  ?NEGATIVE  ? ?Lab Results  ?Component Value Date  ? SARSCOV2NAA NEGATIVE 04/24/2022  ? SARSCOV2NAA NEGATIVE 07/06/2019  ? ?  ?Regarding pertinent Chronic problems:   ?    ?  COPD - not  followed by pulmonology   not  on baseline oxygen  ? Still smokes ?  ?While in ER: ?  ?CTA neg for PE intermittently hypoxic while on RA  in ER ?  ?CXR - Pulmonary hyperinflation and emphysema without acute abnormality of ?the lungs. ?    ?CTA chest -  nonacute, no PE,   no evidence of infiltrate ? ?Following Medications were ordered in ER: ?Medications  ?oxyCODONE-acetaminophen (PERCOCET/ROXICET) 5-325 MG per tablet 1 tablet (1 tablet Oral Given 04/24/22 1646)   ?ipratropium-albuterol (DUONEB) 0.5-2.5 (3) MG/3ML nebulizer solution 3 mL (3 mLs Nebulization Given 04/24/22 1647)  ?morphine (PF) 4 MG/ML injection 4 mg (4 mg Intravenous Given 04/24/22 1759)  ?ondansetron (ZOFRAN) injection 4 mg (4 mg Intravenous Given 04/24/22 1759)  ?iohexol (OMNIPAQUE) 350 MG/ML injection 75 mL (75 mLs Intravenous Contrast Given 04/24/22 1842)  ?   ?  ?ED Triage Vitals  ?Enc Vitals Group  ?   BP 04/24/22 1217 (!) 153/67  ?   Pulse Rate 04/24/22 1217 (!) 55  ?   Resp 04/24/22 1217 15  ?   Temp 04/24/22 1217 98.6 ?F (37 ?C)  ?   Temp Source 04/24/22 1217 Oral  ?   SpO2 04/24/22 1217 (!) 88 %  ?   Weight 04/24/22 1217 90 lb (40.8 kg)  ?   Height --   ?   Head Circumference --   ?   Peak Flow --   ?   Pain Score 04/24/22 1509 8  ?   Pain Loc --   ?   Pain Edu? --   ?   Excl. in GC? --   ?JHER(74)@    ? _________________________________________ ?Significant initial  Findings: ?Abnormal Labs Reviewed  ?BASIC METABOLIC PANEL - Abnormal; Notable for the following components:  ?  Result Value  ? Calcium 8.4 (*)   ? All other components within normal limits  ?CBC WITH DIFFERENTIAL/PLATELET - Abnormal; Notable for the following components:  ? MCV 102.0 (*)   ? All other components within normal limits  ?  ?ECG: Ordered ?Personally reviewed by me showing: ?HR : 46 ?Rhythm:: Sinus bradycardia Borderline right axis deviation Nonspecific T abnrm, anterolateral leads  Nonspecific ST changes  ?QTC 400 ?    ?The recent clinical data is shown below. ?Vitals:  ? 04/24/22 1700 04/24/22 1800 04/24/22 1830 04/24/22 1915  ?BP: 131/65 (!) 149/75 104/65 113/85  ?Pulse: 65 82 (!) 54 64  ?Resp: 18 19 17 17   ?Temp:      ?TempSrc:      ?SpO2: 91% 93% 91% 99%  ?Weight:      ?  ?  ?WBC ? ?   ?Component Value Date/Time  ? WBC 9.1 04/24/2022 1609  ? LYMPHSABS 2.2 04/24/2022 1609  ? MONOABS 0.6 04/24/2022 1609  ? EOSABS 0.2 04/24/2022 1609  ? BASOSABS 0.0 04/24/2022 1609  ? ?  ?Results for orders placed or performed during the  hospital encounter of 04/24/22  ?Resp Panel by RT-PCR (Flu A&B, Covid) Nasopharyngeal Swab     Status: None  ? Collection Time: 04/24/22  4:09 PM  ? Specimen: Nasopharyngeal Swab; Nasopharyngeal(NP) swabs in vial transport medium  ?Result Value Ref Range Status  ? SARS Coronavirus 2 by RT PCR NEGATIVE NEGATIVE Final  ?      ? Influenza A by PCR NEGATIVE NEGATIVE Final  ? Influenza B by PCR NEGATIVE NEGATIVE Final  ?      ?  ?_______________________________________________ ?Hospitalist was called for admission for   Hypoxemia  ?Pulmonary emphysema, unspecified emphysema type (HCC) ?   ?The following Work up has been ordered so far: ? ?Orders Placed This Encounter  ?Procedures  ? Resp Panel by RT-PCR (Flu A&B, Covid) Nasopharyngeal Swab  ? DG Chest 2 View  ? CT Angio Chest PE W and/or Wo Contrast  ? Basic metabolic panel  ? Brain natriuretic peptide  ? CBC with Differential  ? Diet regular Room service appropriate? Yes; Fluid consistency: Thin  ? Pre-admission testing diagnosis  ? Anesthesia Preoperative Order  ? Cardiac monitoring  ? Patient may eat/drink  ? Cardiac monitoring  ? Consult to hospitalist  ? Consult to Transition of Care Team  ? ED EKG  ? Saline lock IV  ? Place in observation (patient's expected length of stay will be less than 2 midnights)  ?  ? ?OTHER Significant initial  Findings: ? ?labs showing: ? ?  ?Recent Labs  ?Lab 04/24/22 ?1609  ?NA 144  ?K 3.9  ?CO2 28  ?GLUCOSE 87  ?BUN 15  ?CREATININE 0.51  ?CALCIUM 8.4*  ? ? ?Cr   stable,    ?Lab Results  ?Component Value Date  ? CREATININE 0.51 04/24/2022  ? CREATININE 0.60 03/21/2022  ? CREATININE 0.55 07/08/2019  ? ? ?No results for input(s): AST, ALT, ALKPHOS, BILITOT, PROT, ALBUMIN in the last 168 hours. ?Lab Results  ?Component Value Date  ? CALCIUM 8.4 (L) 04/24/2022  ?   ?Plt: ?Lab Results  ?Component Value Date  ? PLT 243 04/24/2022  ?  ?   ?Recent Labs  ?Lab 04/24/22 ?1609  ?WBC 9.1  ?NEUTROABS 5.9  ?HGB 14.9  ?HCT 45.2  ?MCV 102.0*  ?PLT 243   ? ? ?HG/HCT  stable,    ?   ?Component Value Date/Time  ?  HGB 14.9 04/24/2022 1609  ? HCT 45.2 04/24/2022 1609  ? MCV 102.0 (H) 04/24/2022 1609  ?    ?    ?Cultures: ?   ?Component Value Date/Time  ? SDES BLOOD RIGHT HAND 07/07/2019 1453  ? SDES BLOOD RIGHT HAND 07/07/2019 1453  ? SPECREQUEST  07/07/2019 1453  ?  BOTTLES DRAWN AEROBIC AND ANAEROBIC Blood Culture adequate volume  ? SPECREQUEST  07/07/2019 1453  ?  BOTTLES DRAWN AEROBIC AND ANAEROBIC Blood Culture adequate volume  ? CULT  07/07/2019 1453  ?  NO GROWTH 5 DAYS ?Performed at [facility], [address] [place], [place] [address] ?  ? CULT  07/07/2019 1453  ?  NO GROWTH 5 DAYS ?Performed at [facility], [address] [place], [place] [address] ?  ? REPTSTATUS 07/12/2019 FINAL 07/07/2019 1453  ? REPTSTATUS 07/12/2019 FINAL 07/07/2019 1453  ? ?  ?Radiological Exams on Admission: ?DG Chest 2 View ? ?Result Date: 04/24/2022 ?CLINICAL DATA:  Hypoxia EXAM: CHEST - 2 VIEW COMPARISON:  07/07/2019 FINDINGS: The heart size and mediastinal contours are within normal limits. Pulmonary hyperinflation and emphysema. The visualized skeletal structures are unremarkable. IMPRESSION: Pulmonary hyperinflation and emphysema without acute abnormality of the lungs. Electronically Signed   By: Jearld Lesch M.D.   On: 04/24/2022 16:43  ? ?CT Angio Chest PE W and/or Wo Contrast ? ?Result Date: 04/24/2022 ?CLINICAL DATA:  Hypoxia. EXAM: CT ANGIOGRAPHY CHEST WITH CONTRAST TECHNIQUE: Multidetector CT imaging of the chest was performed using the standard protocol during bolus administration of intravenous contrast. Multiplanar CT image reconstructions and MIPs were obtained to evaluate the vascular anatomy. RADIATION DOSE REDUCTION: This exam was performed according to the departmental dose-optimization program which includes automated exposure control, adjustment of the mA and/or kV according to patient size and/or use of iterative reconstruction technique. CONTRAST:  62mL  OMNIPAQUE IOHEXOL 350 MG/ML SOLN COMPARISON:  Apr 16, 2017. FINDINGS: Cardiovascular: Satisfactory opacification of the pulmonary arteries to the segmental level. No evidence of pulmonary embolism. Normal heart size. No pe

## 2022-04-24 NOTE — Progress Notes (Signed)
Patient is transferred to 5E at 2110. Alert and oriented x 4. Husband at the bedside. Room is set up. Waiting for medication verified. Will monitor patient as protocol.  ?

## 2022-04-24 NOTE — Progress Notes (Signed)
Pt in surgical short stay, awaiting orders for internal med consult prior to surgery per Dr. Aleene Davidson.  ?

## 2022-04-24 NOTE — ED Notes (Signed)
Pt was given two ham sandwiches, a ginger ale, and two packs of saltine crackers. ?

## 2022-04-25 ENCOUNTER — Encounter (HOSPITAL_COMMUNITY): Payer: Self-pay | Admitting: Internal Medicine

## 2022-04-25 DIAGNOSIS — J9601 Acute respiratory failure with hypoxia: Secondary | ICD-10-CM

## 2022-04-25 DIAGNOSIS — E43 Unspecified severe protein-calorie malnutrition: Secondary | ICD-10-CM | POA: Insufficient documentation

## 2022-04-25 DIAGNOSIS — S82891G Other fracture of right lower leg, subsequent encounter for closed fracture with delayed healing: Secondary | ICD-10-CM | POA: Diagnosis not present

## 2022-04-25 DIAGNOSIS — J439 Emphysema, unspecified: Secondary | ICD-10-CM | POA: Diagnosis not present

## 2022-04-25 LAB — COMPREHENSIVE METABOLIC PANEL
ALT: 12 U/L (ref 0–44)
AST: 18 U/L (ref 15–41)
Albumin: 3.3 g/dL — ABNORMAL LOW (ref 3.5–5.0)
Alkaline Phosphatase: 66 U/L (ref 38–126)
Anion gap: 6 (ref 5–15)
BUN: 12 mg/dL (ref 8–23)
CO2: 28 mmol/L (ref 22–32)
Calcium: 9 mg/dL (ref 8.9–10.3)
Chloride: 106 mmol/L (ref 98–111)
Creatinine, Ser: 0.54 mg/dL (ref 0.44–1.00)
GFR, Estimated: >60 mL/min (ref >60)
Glucose, Bld: 91 mg/dL (ref 70–99)
Potassium: 3.8 mmol/L (ref 3.5–5.1)
Sodium: 140 mmol/L (ref 135–145)
Total Bilirubin: 0.9 mg/dL (ref 0.3–1.2)
Total Protein: 6.2 g/dL — ABNORMAL LOW (ref 6.5–8.1)

## 2022-04-25 LAB — BLOOD GAS, ARTERIAL
Acid-Base Excess: 7.5 mmol/L — ABNORMAL HIGH (ref 0.0–2.0)
Bicarbonate: 32 mmol/L — ABNORMAL HIGH (ref 20.0–28.0)
O2 Saturation: 95.6 %
Patient temperature: 37
pCO2 arterial: 43 mmHg (ref 32–48)
pH, Arterial: 7.48 — ABNORMAL HIGH (ref 7.35–7.45)
pO2, Arterial: 63 mmHg — ABNORMAL LOW (ref 83–108)

## 2022-04-25 LAB — CBC
HCT: 44.3 % (ref 36.0–46.0)
Hemoglobin: 14 g/dL (ref 12.0–15.0)
MCH: 32.5 pg (ref 26.0–34.0)
MCHC: 31.6 g/dL (ref 30.0–36.0)
MCV: 102.8 fL — ABNORMAL HIGH (ref 80.0–100.0)
Platelets: 218 10*3/uL (ref 150–400)
RBC: 4.31 MIL/uL (ref 3.87–5.11)
RDW: 14 % (ref 11.5–15.5)
WBC: 6.9 10*3/uL (ref 4.0–10.5)
nRBC: 0 % (ref 0.0–0.2)

## 2022-04-25 LAB — HIV ANTIBODY (ROUTINE TESTING W REFLEX): HIV Screen 4th Generation wRfx: NONREACTIVE

## 2022-04-25 MED ORDER — UMECLIDINIUM BROMIDE 62.5 MCG/ACT IN AEPB
1.0000 | INHALATION_SPRAY | Freq: Every day | RESPIRATORY_TRACT | Status: DC
  Filled 2022-04-25: qty 7

## 2022-04-25 MED ORDER — ENOXAPARIN SODIUM 30 MG/0.3ML IJ SOSY
30.0000 mg | PREFILLED_SYRINGE | INTRAMUSCULAR | Status: DC
Start: 2022-04-25 — End: 2022-04-26
  Administered 2022-04-25: 30 mg via SUBCUTANEOUS
  Filled 2022-04-25: qty 0.3

## 2022-04-25 MED ORDER — UMECLIDINIUM BROMIDE 62.5 MCG/ACT IN AEPB
1.0000 | INHALATION_SPRAY | Freq: Every day | RESPIRATORY_TRACT | Status: DC
  Administered 2022-04-26 – 2022-04-30 (×5): 1 via RESPIRATORY_TRACT

## 2022-04-25 MED ORDER — ENSURE ENLIVE PO LIQD
237.0000 mL | Freq: Three times a day (TID) | ORAL | Status: DC
  Administered 2022-04-25 – 2022-04-30 (×10): 237 mL via ORAL

## 2022-04-25 MED ORDER — ONDANSETRON HCL 4 MG/2ML IJ SOLN
4.0000 mg | Freq: Four times a day (QID) | INTRAMUSCULAR | Status: AC
  Administered 2022-04-25: 4 mg via INTRAVENOUS
  Filled 2022-04-25: qty 2

## 2022-04-25 MED ORDER — ADULT MULTIVITAMIN W/MINERALS CH
1.0000 | ORAL_TABLET | Freq: Every day | ORAL | Status: DC
  Administered 2022-04-25 – 2022-04-30 (×5): 1 via ORAL
  Filled 2022-04-25 (×5): qty 1

## 2022-04-25 MED ORDER — LOPERAMIDE HCL 2 MG PO CAPS
2.0000 mg | ORAL_CAPSULE | Freq: Four times a day (QID) | ORAL | Status: DC | PRN
  Administered 2022-04-25 – 2022-04-28 (×5): 2 mg via ORAL
  Filled 2022-04-25 (×6): qty 1

## 2022-04-25 MED ORDER — CLONAZEPAM 0.5 MG PO TABS
0.5000 mg | ORAL_TABLET | Freq: Two times a day (BID) | ORAL | Status: DC | PRN
  Administered 2022-04-25 – 2022-04-26 (×2): 0.5 mg via ORAL
  Filled 2022-04-25 (×2): qty 1

## 2022-04-25 NOTE — Discharge Instructions (Addendum)
Orthopaedic Surgery ? ?Breanna Hang, MD ?[name] ? ?Please read the following information regarding your care after surgery. ? ?Medications  ?You only need a prescription for the narcotic pain medicine (ex. oxycodone, Percocet, Norco).  All of the other medicines listed below are available over the counter. ?? Aleve 2 pills twice a day for the first 3 days after surgery. ?? acetominophen (Tylenol) 650 mg every 4-6 hours as you need for minor to moderate pain ?? oxycodone as prescribed for severe pain ? ?? To help prevent blood clots, take xarelto 10 mg once daily after surgery.  You should also get up every hour while you are awake to move around. ? ?Weight Bearing ?? Do NOT bear any weight on the operated leg or foot. ? ?Cast / Splint / Dressing ?? If you have a splint, do NOT remove this. Keep your splint, cast or dressing clean and dry.  Don?t put anything (coat hanger, pencil, etc) down inside of it.  If it gets wet, call the office immediately to schedule an appointment for a cast change. ? ?Swelling ?IMPORTANT: It is normal for you to have swelling where you had surgery. To reduce swelling and pain, keep at least 3 pillows under your leg so that your toes are above your nose and your heel is above the level of your hip.  It may be necessary to keep your foot or leg elevated for several weeks.  This is critical to helping your incisions heal and your pain to feel better. ? ?Follow Up ?Call my office at [phone] when you are discharged from the hospital or surgery center to schedule an appointment to be seen 7-10 days after surgery. ? ?Call my office at [phone] if you develop a fever >101.5? F, nausea, vomiting, bleeding from the surgical site or severe pain.   ? ? ? ? ?High Calorie, High Protein Nutrition Therapy ? ?A high-calorie, high-protein diet has been recommended to you. Your registered dietitian nutritionist (RDN) may have recommended this diet because you are having difficulty eating  enough calories throughout the day, you have lost weight, and/or you need to add protein to your diet. ? ?Sometimes you may not feel like eating, even if you know the importance of good nutrition. The recommendations in this handout can help you with the following: ? ?Regaining your strength and energy ?Keeping your body healthy ?Healing and recovering from surgery or illness and fighting infection ? ?Schedule Your Meals and Snacks ? ?Several small meals and snacks are often better tolerated and digested than large meals. ? ?Strategies ? ?Plan to eat 3 meals and 3 snacks daily. ?Experiment with timing meals to find out when you have a larger appetite. ?Appetite may be greatest in the morning after not eating all night so you may prefer to eat your larger meals and snacks in the morning and at lunch. ?Breakfast-type foods are often better tolerated so eat foods such as eggs, pancakes, waffles and cereal for any meal or snack. ?Carry snacks with you so you are prepared to eat every 2 to 3 hours. ?Determine what works best for you if your body?s cues for feeling hungry or full are not working. ?Eat a small meal or snack even if you don?t feel hungry. ?Set a timer to remind you when it is time to eat. ?Take a walk before you eat (with health care provider?s approval). ?Light or moderate physical activity can help you maintain muscle and increase your appetite. ? ?Make Eating Enjoyable ? ?  Taking steps to make the experience enjoyable may help to increase your interest in eating and improve your appetite. ? ?Strategies: ? ?Eat with others whenever possible. ?Include your favorite foods to make meals more enjoyable. ?Try new foods. ?Save your beverage for the end of the meal so that you have more room for food before you get full. ? ?Add Calories to Your Meals and Snacks ? ?Try adding calorie-dense foods so that each bite provides more nutrition. ? ?Strategies ? ?Drink milk, chocolate milk, soy milk, or smoothies instead  of low-calorie beverages such as diet drinks or water. ?Cook with milk or soy milk instead of water when making dishes such as hot cereal, cocoa, or pudding. ?Add jelly, jam, honey, butter or margarine to bread and crackers. Add jam or fruit to ice cream and as a topping over cake. ?Mix dried fruit, nuts, granola, honey, or dry cereal with yogurt or hot cereals. ?Enjoy snacks such as milkshakes, smoothies, pudding, ice cream, or custard. ?Blend a fruit smoothie of a banana, frozen berries, milk or soy milk, and 1 tablespoon nonfat powdered milk or protein powder. ? ?Add Protein to Your Meals and Snacks ? ?Choose at least one protein food at each meal and snack to increase your daily intake. ? ?Strategies ? ?Add ? cup nonfat dry milk powder or protein powder to make a high-protein milk to drink or to use in recipes that call for milk. Vanilla or peppermint extract or unsweetened cocoa powder could help to boost the flavor. ?Add hard-cooked eggs, leftover meat, grated cheese, canned beans or tofu to noodles, rice, salads, sandwiches, soups, casseroles, pasta, tuna and other mixed dishes. ?Add powdered milk or protein powder to hot cereals, meatloaf, casseroles, scrambled eggs, sauces, cream soups, and shakes. ?Add beans and lentils to salads, soups, casseroles, and vegetable dishes. ?Eat cottage cheese or yogurt, especially [place] yogurt, with fruit as a snack or dessert. ?Eat peanut or other nut butters on crackers, bread, toast, waffles, apples, bananas or celery sticks. Add it to milkshakes, smoothies, or desserts. ?Consider a ready-made protein shake.  ? ?Add Fats to Your Meals and Snacks ? ?Try adding fats to your meals and snacks. Fat provides more calories in fewer bites than carbohydrate or protein and adds flavors to your foods. ? ?Strategies ? ?Snack on nuts and seeds or add them to foods like salads, pasta, cereals, yogurt, and ice cream.  ?Saut? or stir-fry vegetables, meats, chicken, fish or tofu in olive  or canola oil.  ?Add olive oil, other vegetable oils, butter or margarine to soups, vegetables, potatoes, cooked cereal, rice, pasta, bread, crackers, pancakes, or waffles. ?Snack on olives or add to pasta, pizza, or salad. ?Add avocado or guacamole to your salads, sandwiches, and other entrees. ?Include fatty fish such as salmon in your weekly meal plan. ? ?Tips For Adding Protein Nutrition Therapy  ? ? ?Tips ?Add extra egg to one or more meals  ?Increase the portion of milk to drink and change to skim milk if able  ?Include [place] yogurt or cottage cheese for snack or part of a meal  ?Increase portion size of protein entr?e and decrease portion of starch/bread  ?Mix protein powder, nut butter, almond/nut milk, non-fat dry milk, or Greek yogurt to shakes and smoothies  ?Use these ingredients also in baked goods or other recipes ?Use double the amount of sandwich filling  ?Add protein foods to all snacks including cheese, nut butters, milk and yogurt ? ?Food Tips for Including Protein  ?  Beans Cook and use dried peas, beans, and tofu in soups or add to casseroles, pastas, and grain dishes that also contain cheese or meat  ?Mash with cheese and milk  ?Use tofu to make smoothies  ?Commercial Protein Supplements Use nutritional supplements or protein powder sold at pharmacies and grocery stores  ?Use protein powder in milk drinks and desserts, such as pudding  ?Mix with ice cream, milk, and fruit or other flavorings for a high-protein milkshake  ?Cottage Cheese or [employer] Mix with or use to stuff fruits and vegetables  ?Add to casseroles, spaghetti, noodles, or egg dishes such as omelets, scrambled eggs, and souffl?s  ?Use gelatin, pudding-type desserts, cheesecake, and pancake or waffle batter  ?Use to stuff crepes, pasta shells, or manicotti  ?Puree and use as a substitute for sour cream  ?Eggs, Egg whites, and Egg Yolks Add chopped, hard-cooked eggs to salads and dressings, vegetables, casseroles, and creamed  meats  ?Beat eggs into mashed potatoes, vegetable purees, and sauces  ?Add extra egg whites to quiches, scrambled eggs, custards, puddings, pancake batter, or [place] toast wash/batter  ?Make a rich cust

## 2022-04-25 NOTE — Progress Notes (Signed)
Initial Nutrition Assessment ? ?DOCUMENTATION CODES:  ? ?Severe malnutrition in context of chronic illness, Underweight ? ?INTERVENTION:  ? ?-Ensure Plus High Protein po TID, each supplement provides 350 kcal and 20 grams of protein.  ? ?-Multivitamin with minerals daily ? ?-Placed "High Calorie, High Protein" handout in discharge instructions ? ?NUTRITION DIAGNOSIS:  ? ?Severe Malnutrition related to chronic illness as evidenced by energy intake < or equal to 75% for > or equal to 1 month, severe fat depletion, severe muscle depletion. ? ?GOAL:  ? ?Patient will meet greater than or equal to 90% of their needs ? ?MONITOR:  ? ?PO intake, Supplement acceptance, Labs, Weight trends, I & O's ? ?REASON FOR ASSESSMENT:  ? ?Consult ?Assessment of nutrition requirement/status ? ?ASSESSMENT:  ? ?66 y.o. female with medical history significant of COPD not on O2 at home, Myelomalacia.  Pt with right ankle fracture.  Admitted for  Hypoxemia. ? ?Pt reports she has not been eating well. She broke her ankle on 4/24 and has been awaiting surgery. Her surgery was cancelled yesterday d/t increase hypoxemia. Both her and her husband have struggled with health problems. Pt has had some dental work for an abscess in April which has limited her ability to chew certain foods. Prior to April, pt would eat around 2 meals a day.  ?Pt states she wasn't eating as well d/t "a neurocognitive disorder" -did not specify. Pt with COPD and has been a smoker for 50 years. ?Pt likes Ensure, would like to have this in between meals. RD has ordered. ? ?Per weight records, pt has lost 5 lbs since 10/15/21 (5% wt loss x 6.5 months, insignificant for time frame). ? ?Medications: Imodium ? ?Labs reviewed. ? ?NUTRITION - FOCUSED PHYSICAL EXAM: ? ?Flowsheet Row Most Recent Value  ?Orbital Region Mild depletion  ?Upper Arm Region Severe depletion  ?Thoracic and Lumbar Region Severe depletion  ?Buccal Region Moderate depletion  ?Temple Region Moderate depletion   ?Clavicle Bone Region Severe depletion  ?Clavicle and Acromion Bone Region Severe depletion  ?Scapular Bone Region Severe depletion  ?Dorsal Hand Severe depletion  ?Patellar Region Severe depletion  [left side, right leg in cast]  ?Anterior Thigh Region Severe depletion  ?Posterior Calf Region Severe depletion  ?Edema (RD Assessment) None  ?Hair Reviewed  ?Eyes Reviewed  ?Mouth Reviewed  [poor dentition, dental work in the past 2 months]  ?Skin Reviewed  ? ?  ? ? ?Diet Order:   ?Diet Order   ? ?       ?  Diet regular Room service appropriate? Yes; Fluid consistency: Thin  Diet effective now       ?  ? ?  ?  ? ?  ? ? ?EDUCATION NEEDS:  ? ?Education needs have been addressed ? ?Skin:  Skin Assessment: Reviewed RN Assessment ? ?Last BM:  5/11 -type 4 ? ?Height:  ? ?Ht Readings from Last 1 Encounters:  ?04/09/22 5\' 1"  (1.549 m)  ? ? ?Weight:  ? ?Wt Readings from Last 1 Encounters:  ?04/24/22 40.8 kg  ? ? ?BMI:  Body mass index is 17.01 kg/m?. ? ?Estimated Nutritional Needs:  ? ?Kcal:  1500-1700 ? ?Protein:  75-90g ? ?Fluid:  1.7L/day ? ?06/24/22, MS, RD, LDN ?Inpatient Clinical Dietitian ?Contact information available via Amion ? ?

## 2022-04-25 NOTE — Consult Note (Addendum)
? ? ?Patient ID: ?[name] ?MRN: [id number] ?DOB/AGE: [birth year] 66 y.o. ? ?Admit date: 04/24/2022 ? ?Admission Diagnoses:  ?Principal Problem: ?  Acute respiratory failure with hypoxia (HCC) ?Active Problems: ?  COPD with emphysema (HCC) ?  Tobacco abuse ?  Closed right ankle fracture ?  Protein-calorie malnutrition, severe ? ? ?HPI: ?Ortho consult for known right trimalleolar ankle fracture. This injury occurred 2 weeks ago. She was seen outpatient and scheduled for ORIF on 04/24/22. However, this was canceled twice on same day due to concern for pulmonary status. She presented to [facility] ER where CT PE was negative. She was admitted to hospitalist service with acute hypoxemic respiratory failure for pulmonary workup and management.  ? ?Her PMH is notable for COPD with emphysema (no home O2), current daily smoker, myelomalacia.  ? ?Past Medical History: ?Past Medical History:  ?Diagnosis Date  ? Chronic pain   ? gneralized denies fibromyalgia  ? COPD (chronic obstructive pulmonary disease) (HCC)   ? Depression   ? Diverticulosis   ? IBS (irritable bowel syndrome)   ? Myelomalacia (HCC)   ? Panic disorder   ? Panic disorder   ? PTSD (post-traumatic stress disorder)   ? PTSD (post-traumatic stress disorder) 01/08/2019  ? Temporomandibular joint disorder (TMJ)   ? age 66 fractured TMJ - repaired by oral surgeon  ? ? ?Surgical History: ?Past Surgical History:  ?Procedure Laterality Date  ? CHOLECYSTECTOMY    ? EYE SURGERY    ? FRACTURE SURGERY    ? LAPAROSCOPIC APPENDECTOMY N/A 01/08/2019  ? Procedure: APPENDECTOMY LAPAROSCOPIC;  Surgeon: [name], MD;  Location: [facility];  Service: General;  Laterality: N/A;  ? ? ?Family History: ?Family History  ?Problem Relation Age of Onset  ? Polymyalgia rheumatica Mother   ? Migraines Mother   ? Coronary artery disease Father   ? Breast cancer Neg Hx   ? ? ?Social History: ?Social History  ? ?Socioeconomic History  ? Marital status: Married  ?  Spouse name: Not on  file  ? Number of children: Not on file  ? Years of education: Not on file  ? Highest education level: Not on file  ?Occupational History  ? Not on file  ?Tobacco Use  ? Smoking status: Former  ?  Packs/day: 1.00  ?  Years: 50.00  ?  Pack years: 50.00  ?  Types: Cigarettes  ?  Quit date: 08/04/2020  ?  Years since quitting: 1.7  ? Smokeless tobacco: Never  ? Tobacco comments:  ?  Patient states she quit day of admission, 5/10  ?Vaping Use  ? Vaping Use: Never used  ?Substance and Sexual Activity  ? Alcohol use: No  ? Drug use: No  ? Sexual activity: Yes  ?  Birth control/protection: Post-menopausal  ?Other Topics Concern  ? Not on file  ?Social History Narrative  ? Not on file  ? ?Social Determinants of Health  ? ?Financial Resource Strain: Not on file  ?Food Insecurity: Not on file  ?Transportation Needs: Not on file  ?Physical Activity: Not on file  ?Stress: Not on file  ?Social Connections: Not on file  ?Intimate Partner Violence: Not on file  ? ? ?Allergies: ?Amoxicillin-pot clavulanate, Naltrexone, Nsaids, Toradol [ketorolac tromethamine], Tramadol, Atorvastatin, Benadryl [diphenhydramine hcl], Ceftin [cefuroxime axetil], Compazine, and Epinephrine ? ?Medications: ?I have reviewed the patient's current medications. ? ?Vital Signs: ?Patient Vitals for the past 24 hrs: ? BP Temp Temp src Pulse Resp SpO2  ?04/25/22 2015 135/76  98.2 ?F (36.8 ?C) Oral 85 18 90 %  ?04/25/22 1500 129/83 98.6 ?F (37 ?C) Oral 92 18 91 %  ?04/25/22 0935 134/77 97.8 ?F (36.6 ?C) Oral 71 16 95 %  ?04/25/22 0851 -- -- -- -- -- 94 %  ?04/25/22 0507 137/71 98 ?F (36.7 ?C) Oral 80 -- 95 %  ?04/25/22 0157 113/62 98 ?F (36.7 ?C) Oral 64 -- 95 %  ? ? ?Radiology: ?DG Chest 2 View ? ?Result Date: 04/24/2022 ?CLINICAL DATA:  Hypoxia EXAM: CHEST - 2 VIEW COMPARISON:  07/07/2019 FINDINGS: The heart size and mediastinal contours are within normal limits. Pulmonary hyperinflation and emphysema. The visualized skeletal structures are unremarkable.  IMPRESSION: Pulmonary hyperinflation and emphysema without acute abnormality of the lungs. Electronically Signed   By: Jearld LeschAlex D Bibbey M.D.   On: 04/24/2022 16:43  ? ?DG Tibia/Fibula Right ? ?Result Date: 04/09/2022 ?CLINICAL DATA:  Fall out of bed on Monday. Swelling and bruising to right lower leg. EXAM: RIGHT TIBIA AND FIBULA - 2 VIEW COMPARISON:  Concurrent ankle exam, reported separately. FINDINGS: Distal tibia and fibular fractures better assessed on concurrent ankle exam. There is no fracture of the proximal tibia or fibula. Knee alignment is maintained. The bones are under mineralized. Generalized soft tissue edema. IMPRESSION: Distal tibia and fibular fractures better assessed on concurrent ankle exam. No fracture of the proximal lower leg. Electronically Signed   By: Narda RutherfordMelanie  Sanford M.D.   On: 04/09/2022 18:59  ? ?DG Ankle Complete Right ? ?Result Date: 04/09/2022 ?CLINICAL DATA:  Fall out of bed on Monday. Swelling and bruising to the right lower extremity. EXAM: RIGHT ANKLE - COMPLETE 3+ VIEW COMPARISON:  None. FINDINGS: Oblique displaced distal fibular fracture at and just above the level of the ankle mortise. Nondisplaced transverse medial malleolar fracture. Equivocal widening of the lateral clear space. Intact talar dome. The bones are under mineralized there is a small ankle joint effusion. Generalized tissue edema IMPRESSION: Displaced distal fibular fracture and nondisplaced transverse medial malleolar fracture. Equivocal widening of the lateral clear space. Electronically Signed   By: Narda RutherfordMelanie  Sanford M.D.   On: 04/09/2022 18:58  ? ?CT Angio Chest PE W and/or Wo Contrast ? ?Result Date: 04/24/2022 ?CLINICAL DATA:  Hypoxia. EXAM: CT ANGIOGRAPHY CHEST WITH CONTRAST TECHNIQUE: Multidetector CT imaging of the chest was performed using the standard protocol during bolus administration of intravenous contrast. Multiplanar CT image reconstructions and MIPs were obtained to evaluate the vascular anatomy.  RADIATION DOSE REDUCTION: This exam was performed according to the departmental dose-optimization program which includes automated exposure control, adjustment of the mA and/or kV according to patient size and/or use of iterative reconstruction technique. CONTRAST:  75mL OMNIPAQUE IOHEXOL 350 MG/ML SOLN COMPARISON:  Apr 16, 2017. FINDINGS: Cardiovascular: Satisfactory opacification of the pulmonary arteries to the segmental level. No evidence of pulmonary embolism. Normal heart size. No pericardial effusion. Mediastinum/Nodes: No enlarged mediastinal, hilar, or axillary lymph nodes. Thyroid gland, trachea, and esophagus demonstrate no significant findings. Lungs/Pleura: No pneumothorax or pleural effusion is noted. Mild bibasilar subsegmental atelectasis is noted. Mild emphysematous disease is noted. Upper Abdomen: No acute abnormality. Musculoskeletal: No chest wall abnormality. No acute or significant osseous findings. Review of the MIP images confirms the above findings. IMPRESSION: No definite evidence of pulmonary embolus. Mild bibasilar subsegmental atelectasis. Aortic Atherosclerosis (ICD10-I70.0) and Emphysema (ICD10-J43.9). Electronically Signed   By: Lupita RaiderJames  Green Jr M.D.   On: 04/24/2022 19:01  ? ?CT ANKLE RIGHT WO CONTRAST ? ?Result Date: 04/19/2022 ?CLINICAL DATA:  Right ankle fracture EXAM: CT OF THE RIGHT ANKLE WITHOUT CONTRAST TECHNIQUE: Multidetector CT imaging of the right ankle was performed according to the standard protocol. Multiplanar CT image reconstructions were also generated. RADIATION DOSE REDUCTION: This exam was performed according to the departmental dose-optimization program which includes automated exposure control, adjustment of the mA and/or kV according to patient size and/or use of iterative reconstruction technique. COMPARISON:  X-ray 04/09/2022 FINDINGS: Bones/Joint/Cartilage Obliquely oriented fracture of the distal fibular metaphysis and lateral malleolus with minimal  posterolateral fracture displacement. Nondisplaced transversely oriented fracture through the medial malleolus. Mildly displaced avulsion fragment from the anterolateral aspect of the distal tibia at the tibiofibular join

## 2022-04-25 NOTE — Plan of Care (Signed)

## 2022-04-25 NOTE — Assessment & Plan Note (Signed)
this patient has acute respiratory failure with Hypoxia and  documented by the presence of following: ?O2 saturatio< 90% on RA  ?Likely due to COPD  ?likely undiagnosed chronic hypoxia ?Provide O2 therapy and titrate as needed ? Continuous pulse ox ?  check Pulse ox with ambulation prior to discharge ?  may need  TC consult for home O2 set up ?  ?

## 2022-04-25 NOTE — Consult Note (Signed)
? ?NAME:  [name], MRN:  [id number], DOB:  [birth year], LOS: 0 ?ADMISSION DATE:  04/24/2022, CONSULTATION DATE:  04/25/2022 ?REFERRING MD:  Dr. Darrick Meigs, CHIEF COMPLAINT:  Surgical clearance   ? ?History of Present Illness:  ?[name] is a 66 y.o. female with COPD, daily tobacco use, transverse myelitis, depression, PTSD, panic disorder, and chronic pain who presented to [facility] ED 5/10 for after being referred from outpatient surgical center for hypoxia, Patient suffered a ankle fracture end of April and was planned to undergo ORIF 5/10 but during pre-op workup patient was found hypoxic with oxygen saturations mid 80's on room air. Per chart review patient states that she generally is mildly hypoxic on medical assessments.  ? ?PCCM was consulted 5/11 for assistance in workup and management of hypoxia  ? ?Additional pulmonary history  ?Patient states that she has a 50 pack per year smoking history, she states that she has decided to quit smoking officially after this hospitalization. She denise any chronic cough or congestion. She does report intermittent expiratory wheeze that seems to be worse at night. She states she utilizes her Symbicort BID and typically needs her recuse inhaler 2-3x per week. Recently she was referred for sleep study per neurology due to excessive sleepiness, she also reports occasional snoring.  ? ?Pertinent  Medical History  ?COPD, daily tobacco use, transverse myelitis, depression, PTSD, panic disorder, and chronic pain ? ?Significant Hospital Events: ?Including procedures, antibiotic start and stop dates in addition to other pertinent events   ?5/10 presented originally for outpatient surgery where she was seen hypoxic and sent to ED for further evlauation ?5/11 PCCM consulted for further workup and management  ? ?Interim History / Subjective:  ?Seen lying in bed with no acute complaints  ? ?Objective   ?Blood pressure 129/83, pulse 92, temperature 98.6 ?F (37 ?C), temperature source  Oral, resp. rate 18, weight 40.8 kg, SpO2 91 %. ?   ?FiO2 (%):  [32 %] 32 %  ? ?Intake/Output Summary (Last 24 hours) at 04/25/2022 1516 ?Last data filed at 04/25/2022 1150 ?Gross per 24 hour  ?Intake 1068.04 ml  ?Output 550 ml  ?Net 518.04 ml  ? ?Filed Weights  ? 04/24/22 1217  ?Weight: 40.8 kg  ? ? ?Examination: ?General: Acute on chronic thin deconditioned middle aged female lying in bed in NAD ?HEENT: [place]/AT, MM pink/moist, PERRL,  ?Neuro: Alert and oriented x3, global weakness  ?CV: s1s2 regular rate and rhythm, no murmur, rubs, or gallops,  ?PULM:  Clear to ascultation, no increased work of breathing, no added breath sounds  ?GI: soft, bowel sounds active in all 4 quadrants, non-tender, non-distended, tolerating oral diet  ?Extremities: warm/dry, no edema  ?Skin: no rashes or lesions ? ?Resolved Hospital Problem list   ? ? ?Assessment & Plan:  ? ?For Ms. Dible, risk of perioperative pulmonary complications is increased by: ? +Age greater than 65 years ? +COPD ? +Serum albumin <3.5 ? +Smoking ? ?Respiratory complications generally occur in 1% of ASA Class I patients, 5% of ASA Class II and 10% of ASA Class III-IV patients These complications rarely result in mortality and include postoperative pneumonia, atelectasis, pulmonary embolism, ARDS and increased time requiring postoperative mechanical ventilation. ARISCAT score 11 (indicating 1.6% risk of in-hospital post-op pulmonary complications ) ? ?Overall, I recommend proceeding with the surgery if the risk for respiratory complications are outweighed by the potential benefits. This will need to be discussed between the patient and surgeon. ? ?I have discussed the  risk factors and recommendations above with the patient. Patient has advanced COPD with possible undiagnosed OSA ? ?Recommend/Plan  ?-Short duration of surgery as much as possible and avoid paralytic if possible ?-Inpatient use of auto-CPAP for OSA whenever the patient is sleeping ?-DVT  prophylaxis ?-Avoiding use of pancuronium during anesthesia ?-Aggressive pulmonary toilet with o2, and incentive spirometry and early ambulation ?-Optimize bronchodilator regiment  ?-Encourage smoking cessation  ?-OOB, encourage mobility post-op  ?-Check ABG prior to assess for underlying hypercapnia  ?-Check ECHO  ?-Continue Dulera (in place of home symbicort) and add Incruse  ?-Would benefit from outpatient pulmonary follow up, prefers to see someone in the [facility] system as it is close to home  ? ?Best Practice (right click and "Reselect all SmartList Selections" daily)  ?Per primary  ? ?Labs   ?CBC: ?Recent Labs  ?Lab 04/24/22 ?1609 04/25/22 ?0540  ?WBC 9.1 6.9  ?NEUTROABS 5.9  --   ?HGB 14.9 14.0  ?HCT 45.2 44.3  ?MCV 102.0* 102.8*  ?PLT 243 218  ? ? ?Basic Metabolic Panel: ?Recent Labs  ?Lab 04/24/22 ?1609 04/25/22 ?0540  ?NA 144 140  ?K 3.9 3.8  ?CL 111 106  ?CO2 28 28  ?GLUCOSE 87 91  ?BUN 15 12  ?CREATININE 0.51 0.54  ?CALCIUM 8.4* 9.0  ? ?GFR: ?Estimated Creatinine Clearance: 45.2 mL/min (by C-G formula based on SCr of 0.54 mg/dL). ?Recent Labs  ?Lab 04/24/22 ?1609 04/25/22 ?0540  ?WBC 9.1 6.9  ? ? ?Liver Function Tests: ?Recent Labs  ?Lab 04/25/22 ?0540  ?AST 18  ?ALT 12  ?ALKPHOS 66  ?BILITOT 0.9  ?PROT 6.2*  ?ALBUMIN 3.3*  ? ?No results for input(s): LIPASE, AMYLASE in the last 168 hours. ?No results for input(s): AMMONIA in the last 168 hours. ? ?ABG ?   ?Component Value Date/Time  ? TCO2 26 09/15/2008 2021  ?  ? ?Coagulation Profile: ?No results for input(s): INR, PROTIME in the last 168 hours. ? ?Cardiac Enzymes: ?No results for input(s): CKTOTAL, CKMB, CKMBINDEX, TROPONINI in the last 168 hours. ? ?HbA1C: ?No results found for: HGBA1C ? ?CBG: ?No results for input(s): GLUCAP in the last 168 hours. ? ?Review of Systems:   ?Please see the history of present illness. All other systems reviewed and are negative  ? ?Past Medical History:  ?She,  has a past medical history of Chronic pain, COPD (chronic  obstructive pulmonary disease) ([place]), Depression, Diverticulosis, IBS (irritable bowel syndrome), Myelomalacia ([place]), Panic disorder, Panic disorder, PTSD (post-traumatic stress disorder), PTSD (post-traumatic stress disorder) (01/08/2019), and Temporomandibular joint disorder (TMJ).  ? ?Surgical History:  ? ?Past Surgical History:  ?Procedure Laterality Date  ? CHOLECYSTECTOMY    ? EYE SURGERY    ? FRACTURE SURGERY    ? LAPAROSCOPIC APPENDECTOMY N/A 01/08/2019  ? Procedure: APPENDECTOMY LAPAROSCOPIC;  Surgeon: Virl Cagey, MD;  Location: [facility];  Service: General;  Laterality: N/A;  ?  ? ?Social History:  ? reports that she quit smoking about 20 months ago. Her smoking use included cigarettes. She smoked an average of .5 packs per day. She has never used smokeless tobacco. She reports that she does not drink alcohol and does not use drugs.  ? ?Family History:  ?Her family history includes Coronary artery disease in her father; Migraines in her mother; Polymyalgia rheumatica in her mother. There is no history of Breast cancer.  ? ?Allergies ?Allergies  ?Allergen Reactions  ? Amoxicillin-Pot Clavulanate Other (See Comments)  ?  GI upset  ? Naltrexone Other (See  Comments)  ?  Hallucinations, sleepiness, withdrawal s/s, poor apepitie ?  ? Nsaids   ?  Advised not to take   ? Toradol [Ketorolac Tromethamine] Nausea And Vomiting and Swelling  ?  TONGUE SWELLING  ? Tramadol Nausea And Vomiting  ? Atorvastatin Other (See Comments), Itching, Nausea Only and Tinitus  ? Benadryl [Diphenhydramine Hcl] Palpitations  ?  Insomnia  ? Ceftin [Cefuroxime Axetil] Rash  ? Compazine Palpitations  ?  Insomnia  ? Epinephrine Palpitations  ?  Insomina  ?  ? ?Home Medications  ?Prior to Admission medications   ?Medication Sig Start Date End Date Taking? Authorizing Provider  ?acetaminophen (TYLENOL) 500 MG tablet Take 1,000 mg by mouth 4 (four) times daily as needed (pain).   Yes [provider]  ?albuterol (PROVENTIL  HFA;VENTOLIN HFA) 108 (90 Base) MCG/ACT inhaler Inhale 2 puffs into the lungs daily as needed for wheezing or shortness of breath.   Yes [provider]  ?albuterol (PROVENTIL) (2.5 MG/3ML) 0.083% nebulizer solution Take 3

## 2022-04-25 NOTE — Progress Notes (Addendum)
[facility] ? ?PROGRESS NOTE ? ?RUI SIKKEMA [id number] DOB: 02-27-1956 DOA: 04/24/2022 ?PCP: Vidal Schwalbe, MD ? ? ?Brief HPI:   ?66 year old female with history of COPD, not on oxygen at home, myelomalacia presented with hypoxia.  Patient has history of COPD, not on home oxygen, recently had ankle injury ? and was supposed to undergo operative intervention by orthopedics, but during preop evaluation was found to be hypoxemic with O2 sats in mid 80s on room air.  She was sent to ED for further evaluation. ?In the ED CTA chest was done which was negative for PE. ? ? ?Subjective  ? ?Patient seen and examined, still requiring 4 L/min of oxygen via nasal cannula.  Denies shortness of breath. ? ? Assessment/Plan:  ? ? 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 ??  COPD/emphysema ?-CT chest shows emphysema ?-Not in exacerbation at this time ? ?Ankle fracture ?-Discussed with Dr. Kathaleen Bury ?-Surgery likely in a.m. ?-We will obtain pulmonary consult for preop clearance ?-Continue Vicodin as needed/Dilaudid IV as needed ? ?Preop eval ?-Patient able to walk a flight of stairs or 100 feet ?-EKG showed no acute ischemia ?-Patient is moderate risk for surgery ? ?Anxiety ?-Restart clonazepam 0.5 mg p.o. twice daily as needed ? ? ? ?Medications ? ?  ? feeding supplement  237 mL Oral TID BM  ? gabapentin  600 mg Oral Q lunch  ? gabapentin  900 mg Oral BID  ? mometasone-formoterol  2 puff Inhalation BID  ? multivitamin with minerals  1 tablet Oral Daily  ? nicotine  21 mg Transdermal Daily  ? ? ? Data Reviewed:  ? ?CBG: ? ?No results for input(s): GLUCAP in the last 168 hours. ? ?SpO2: 91 % ?O2 Flow Rate (L/min): 4 L/min ?FiO2 (%): 32 %  ? ? ?Vitals:  ? 04/25/22 0507 04/25/22 0851 04/25/22 0935 04/25/22 1500  ?BP: 137/71  134/77 129/83  ?Pulse: 80  71 92  ?Resp:   16 18  ?Temp: 98 ?F (36.7 ?C)  97.8 ?F (36.6 ?C) 98.6 ?F (37 ?C)  ?TempSrc: Oral  Oral  Oral  ?SpO2: 95% 94% 95% 91%  ?Weight:      ? ? ? ? ?Data Reviewed: ? ?Basic Metabolic Panel: ?Recent Labs  ?Lab 04/24/22 ?1609 04/25/22 ?0540  ?NA 144 140  ?K 3.9 3.8  ?CL 111 106  ?CO2 28 28  ?GLUCOSE 87 91  ?BUN 15 12  ?CREATININE 0.51 0.54  ?CALCIUM 8.4* 9.0  ? ? ?CBC: ?Recent Labs  ?Lab 04/24/22 ?1609 04/25/22 ?0540  ?WBC 9.1 6.9  ?NEUTROABS 5.9  --   ?HGB 14.9 14.0  ?HCT 45.2 44.3  ?MCV 102.0* 102.8*  ?PLT 243 218  ? ? ?LFT ?Recent Labs  ?Lab 04/25/22 ?0540  ?AST 18  ?ALT 12  ?ALKPHOS 66  ?BILITOT 0.9  ?PROT 6.2*  ?ALBUMIN 3.3*  ? ?  ?Antibiotics: ?Anti-infectives (From admission, onward)  ? ? None  ? ?  ? ? ? ?DVT prophylaxis: Lovenox ? ?Code Status: Full code ? ?Family Communication: Discussed with husband at bedside ? ? ?CONSULTS  ? ? ?Objective  ? ? ?Physical Examination: ? ? ?General-appears in no acute distress ?Heart-S1-S2, regular, no murmur auscultated ?Lungs-clear to auscultation bilaterally, no wheezing or crackles auscultated ?Abdomen-soft, nontender, no organomegaly ?Extremities-no edema in the lower extremities ?Neuro-alert, oriented x3, no focal deficit noted ? ? ?Status is: Inpatient: Acute hypoxemic respiratory failure ? ? ? ?  ? ? ? ? ? ?  Oswald Hillock ?  ?[facility] ?If 7PM-7AM, please contact night-coverage at [email or web address], ?Office  [phone] ? ? ?04/25/2022, 3:53 PM  LOS: 0 days  ? ? ? ? ? ? ? ? ? ? ?  ?

## 2022-04-25 NOTE — Progress Notes (Signed)
PT Cancellation Note ? ?Patient Details ?Name: [name] ?MRN: [id number] ?DOB: [birth year] ? ? ?Cancelled Treatment:    Reason Eval/Treat Not Completed: Fatigue/lethargy limiting ability to participate (pt stated she didn't sleep at all last night and is too tired to do PT at present. Will follow.) ? ? ?Ralene Bathe Kistler PT 04/25/2022  ?Acute Rehabilitation Services ?Pager [phone] ?Office [phone] ? ?

## 2022-04-26 ENCOUNTER — Observation Stay (HOSPITAL_COMMUNITY): Payer: Medicare Other

## 2022-04-26 ENCOUNTER — Observation Stay (HOSPITAL_COMMUNITY): Payer: Medicare Other | Admitting: Anesthesiology

## 2022-04-26 ENCOUNTER — Encounter (HOSPITAL_COMMUNITY)
Admission: AD | Disposition: A | Discharge status: Home / Self Care | Payer: Self-pay | Source: Ambulatory Visit | Attending: Family Medicine

## 2022-04-26 ENCOUNTER — Encounter (HOSPITAL_COMMUNITY): Payer: Self-pay | Admitting: Internal Medicine

## 2022-04-26 ENCOUNTER — Telehealth: Payer: Self-pay | Admitting: Acute Care

## 2022-04-26 ENCOUNTER — Inpatient Hospital Stay (HOSPITAL_COMMUNITY): Payer: Medicare Other

## 2022-04-26 DIAGNOSIS — G4733 Obstructive sleep apnea (adult) (pediatric): Secondary | ICD-10-CM | POA: Diagnosis present

## 2022-04-26 DIAGNOSIS — Z885 Allergy status to narcotic agent status: Secondary | ICD-10-CM | POA: Diagnosis not present

## 2022-04-26 DIAGNOSIS — E43 Unspecified severe protein-calorie malnutrition: Secondary | ICD-10-CM | POA: Diagnosis present

## 2022-04-26 DIAGNOSIS — F32A Depression, unspecified: Secondary | ICD-10-CM | POA: Diagnosis present

## 2022-04-26 DIAGNOSIS — F418 Other specified anxiety disorders: Secondary | ICD-10-CM | POA: Diagnosis not present

## 2022-04-26 DIAGNOSIS — Z7951 Long term (current) use of inhaled steroids: Secondary | ICD-10-CM | POA: Diagnosis not present

## 2022-04-26 DIAGNOSIS — J9621 Acute and chronic respiratory failure with hypoxia: Secondary | ICD-10-CM | POA: Diagnosis present

## 2022-04-26 DIAGNOSIS — Z888 Allergy status to other drugs, medicaments and biological substances status: Secondary | ICD-10-CM | POA: Diagnosis not present

## 2022-04-26 DIAGNOSIS — Z880 Allergy status to penicillin: Secondary | ICD-10-CM | POA: Diagnosis not present

## 2022-04-26 DIAGNOSIS — Z8249 Family history of ischemic heart disease and other diseases of the circulatory system: Secondary | ICD-10-CM | POA: Diagnosis not present

## 2022-04-26 DIAGNOSIS — S82841A Displaced bimalleolar fracture of right lower leg, initial encounter for closed fracture: Secondary | ICD-10-CM

## 2022-04-26 DIAGNOSIS — E876 Hypokalemia: Secondary | ICD-10-CM | POA: Diagnosis not present

## 2022-04-26 DIAGNOSIS — J439 Emphysema, unspecified: Secondary | ICD-10-CM | POA: Diagnosis present

## 2022-04-26 DIAGNOSIS — K5790 Diverticulosis of intestine, part unspecified, without perforation or abscess without bleeding: Secondary | ICD-10-CM | POA: Diagnosis present

## 2022-04-26 DIAGNOSIS — R0609 Other forms of dyspnea: Secondary | ICD-10-CM

## 2022-04-26 DIAGNOSIS — F4310 Post-traumatic stress disorder, unspecified: Secondary | ICD-10-CM | POA: Diagnosis present

## 2022-04-26 DIAGNOSIS — R0902 Hypoxemia: Secondary | ICD-10-CM | POA: Diagnosis present

## 2022-04-26 DIAGNOSIS — J449 Chronic obstructive pulmonary disease, unspecified: Secondary | ICD-10-CM

## 2022-04-26 DIAGNOSIS — Z87891 Personal history of nicotine dependence: Secondary | ICD-10-CM | POA: Diagnosis not present

## 2022-04-26 DIAGNOSIS — Z681 Body mass index (BMI) 19 or less, adult: Secondary | ICD-10-CM | POA: Diagnosis not present

## 2022-04-26 DIAGNOSIS — G9589 Other specified diseases of spinal cord: Secondary | ICD-10-CM | POA: Diagnosis present

## 2022-04-26 DIAGNOSIS — Z79899 Other long term (current) drug therapy: Secondary | ICD-10-CM | POA: Diagnosis not present

## 2022-04-26 DIAGNOSIS — Z20822 Contact with and (suspected) exposure to covid-19: Secondary | ICD-10-CM | POA: Diagnosis present

## 2022-04-26 DIAGNOSIS — Z7982 Long term (current) use of aspirin: Secondary | ICD-10-CM | POA: Diagnosis not present

## 2022-04-26 DIAGNOSIS — J9601 Acute respiratory failure with hypoxia: Secondary | ICD-10-CM | POA: Diagnosis not present

## 2022-04-26 DIAGNOSIS — F410 Panic disorder [episodic paroxysmal anxiety] without agoraphobia: Secondary | ICD-10-CM | POA: Diagnosis present

## 2022-04-26 DIAGNOSIS — S82891G Other fracture of right lower leg, subsequent encounter for closed fracture with delayed healing: Secondary | ICD-10-CM | POA: Diagnosis not present

## 2022-04-26 HISTORY — PX: ORIF ANKLE FRACTURE: SHX5408

## 2022-04-26 LAB — SURGICAL PCR SCREEN
MRSA, PCR: NEGATIVE
Staphylococcus aureus: NEGATIVE

## 2022-04-26 LAB — ECHOCARDIOGRAM COMPLETE
AR max vel: 2.56 cm2
AV Peak grad: 10.5 mmHg
Ao pk vel: 1.62 m/s
Area-P 1/2: 2.81 cm2
S' Lateral: 2 cm
Weight: 1440 oz

## 2022-04-26 SURGERY — OPEN REDUCTION INTERNAL FIXATION (ORIF) ANKLE FRACTURE
Anesthesia: Regional | Site: Ankle | Laterality: Right

## 2022-04-26 MED ORDER — PROPOFOL 10 MG/ML IV BOLUS
INTRAVENOUS | Status: AC
  Filled 2022-04-26: qty 20

## 2022-04-26 MED ORDER — AMISULPRIDE (ANTIEMETIC) 5 MG/2ML IV SOLN: 10.0000 mg | Freq: Once | INTRAVENOUS | Status: DC | PRN

## 2022-04-26 MED ORDER — FENTANYL CITRATE PF 50 MCG/ML IJ SOSY
PREFILLED_SYRINGE | INTRAMUSCULAR | Status: AC
  Filled 2022-04-26: qty 1

## 2022-04-26 MED ORDER — DEXMEDETOMIDINE (PRECEDEX) IN NS 20 MCG/5ML (4 MCG/ML) IV SYRINGE
PREFILLED_SYRINGE | INTRAVENOUS | Status: DC | PRN
  Administered 2022-04-26: 8 ug via INTRAVENOUS
  Administered 2022-04-26: 4 ug via INTRAVENOUS
  Administered 2022-04-26: 8 ug via INTRAVENOUS

## 2022-04-26 MED ORDER — FENTANYL CITRATE PF 50 MCG/ML IJ SOSY
PREFILLED_SYRINGE | INTRAMUSCULAR | Status: AC
  Administered 2022-04-26: 25 ug via INTRAVENOUS
  Filled 2022-04-26: qty 1

## 2022-04-26 MED ORDER — 0.9 % SODIUM CHLORIDE (POUR BTL) OPTIME
TOPICAL | Status: DC | PRN
  Administered 2022-04-26: 1000 mL

## 2022-04-26 MED ORDER — MUPIROCIN 2 % EX OINT
1.0000 "application " | TOPICAL_OINTMENT | Freq: Two times a day (BID) | CUTANEOUS | Status: DC
  Administered 2022-04-26 – 2022-04-30 (×9): 1 via NASAL
  Filled 2022-04-26 (×2): qty 22

## 2022-04-26 MED ORDER — ROPIVACAINE HCL 5 MG/ML IJ SOLN
INTRAMUSCULAR | Status: DC | PRN
  Administered 2022-04-26: 10 mL via PERINEURAL

## 2022-04-26 MED ORDER — CIPROFLOXACIN IN D5W 400 MG/200ML IV SOLN
400.0000 mg | INTRAVENOUS | Status: AC
Start: 2022-04-26 — End: 2022-04-26
  Administered 2022-04-26: 400 mg via INTRAVENOUS
  Filled 2022-04-26: qty 200

## 2022-04-26 MED ORDER — ACETAMINOPHEN 325 MG PO TABS: 325.0000 mg | ORAL_TABLET | ORAL | Status: DC | PRN

## 2022-04-26 MED ORDER — PHENYLEPHRINE HCL-NACL 20-0.9 MG/250ML-% IV SOLN
INTRAVENOUS | Status: DC | PRN
  Administered 2022-04-26: 30 ug/min via INTRAVENOUS

## 2022-04-26 MED ORDER — BUPIVACAINE HCL (PF) 0.25 % IJ SOLN
INTRAMUSCULAR | Status: AC
  Filled 2022-04-26: qty 30

## 2022-04-26 MED ORDER — MIDAZOLAM HCL 2 MG/2ML IJ SOLN
INTRAMUSCULAR | Status: AC
  Filled 2022-04-26: qty 2

## 2022-04-26 MED ORDER — ACETAMINOPHEN 160 MG/5ML PO SOLN: 325.0000 mg | ORAL | Status: DC | PRN

## 2022-04-26 MED ORDER — PROPOFOL 10 MG/ML IV BOLUS
INTRAVENOUS | Status: DC | PRN
  Administered 2022-04-26: 20 mg via INTRAVENOUS
  Administered 2022-04-26: 40 mg via INTRAVENOUS
  Administered 2022-04-26: 20 mg via INTRAVENOUS
  Administered 2022-04-26: 10 mg via INTRAVENOUS
  Administered 2022-04-26 (×2): 20 mg via INTRAVENOUS

## 2022-04-26 MED ORDER — ACETAMINOPHEN 10 MG/ML IV SOLN: 1000.0000 mg | Freq: Once | INTRAVENOUS | Status: DC | PRN

## 2022-04-26 MED ORDER — FENTANYL CITRATE PF 50 MCG/ML IJ SOSY
25.0000 ug | PREFILLED_SYRINGE | INTRAMUSCULAR | Status: DC | PRN
  Administered 2022-04-26: 25 ug via INTRAVENOUS

## 2022-04-26 MED ORDER — VANCOMYCIN HCL 1000 MG IV SOLR
INTRAVENOUS | Status: DC | PRN
  Administered 2022-04-26: 1000 mg via TOPICAL

## 2022-04-26 MED ORDER — BUPIVACAINE-EPINEPHRINE (PF) 0.5% -1:200000 IJ SOLN
INTRAMUSCULAR | Status: DC | PRN
  Administered 2022-04-26: 15 mL

## 2022-04-26 MED ORDER — VANCOMYCIN HCL 1000 MG IV SOLR
INTRAVENOUS | Status: AC
  Filled 2022-04-26: qty 20

## 2022-04-26 MED ORDER — CLONAZEPAM 0.5 MG PO TABS
0.5000 mg | ORAL_TABLET | Freq: Three times a day (TID) | ORAL | Status: DC | PRN
  Administered 2022-04-26 – 2022-04-30 (×10): 0.5 mg via ORAL
  Filled 2022-04-26 (×10): qty 1

## 2022-04-26 MED ORDER — ONDANSETRON HCL 4 MG/2ML IJ SOLN
4.0000 mg | Freq: Four times a day (QID) | INTRAMUSCULAR | Status: DC | PRN
  Administered 2022-04-26: 4 mg via INTRAVENOUS
  Filled 2022-04-26: qty 2

## 2022-04-26 MED ORDER — PROPOFOL 500 MG/50ML IV EMUL
INTRAVENOUS | Status: DC | PRN
  Administered 2022-04-26: 50 ug/kg/min via INTRAVENOUS

## 2022-04-26 MED ORDER — MIDAZOLAM HCL 2 MG/2ML IJ SOLN
INTRAMUSCULAR | Status: AC
Start: 2022-04-26 — End: ?
  Filled 2022-04-26: qty 2

## 2022-04-26 MED ORDER — FENTANYL CITRATE (PF) 100 MCG/2ML IJ SOLN
INTRAMUSCULAR | Status: DC | PRN
  Administered 2022-04-26 (×2): 25 ug via INTRAVENOUS

## 2022-04-26 MED ORDER — FENTANYL CITRATE PF 50 MCG/ML IJ SOSY
12.5000 ug | PREFILLED_SYRINGE | Freq: Once | INTRAMUSCULAR | Status: AC
  Administered 2022-04-26: 25 ug via INTRAVENOUS

## 2022-04-26 MED ORDER — MIDAZOLAM HCL 2 MG/2ML IJ SOLN
INTRAMUSCULAR | Status: DC | PRN
  Administered 2022-04-26 (×2): 1 mg via INTRAVENOUS

## 2022-04-26 MED ORDER — PROPOFOL 500 MG/50ML IV EMUL
INTRAVENOUS | Status: AC
  Filled 2022-04-26: qty 50

## 2022-04-26 MED ORDER — LACTATED RINGERS IV SOLN: INTRAVENOUS | Status: DC

## 2022-04-26 MED ORDER — ACETAMINOPHEN 10 MG/ML IV SOLN
INTRAVENOUS | Status: AC
Start: 2022-04-26 — End: 2022-04-26
  Administered 2022-04-26: 1000 mg via INTRAVENOUS
  Filled 2022-04-26: qty 100

## 2022-04-26 MED ORDER — ENOXAPARIN SODIUM 30 MG/0.3ML IJ SOSY
30.0000 mg | PREFILLED_SYRINGE | INTRAMUSCULAR | Status: DC
  Administered 2022-04-27 – 2022-04-29 (×3): 30 mg via SUBCUTANEOUS
  Filled 2022-04-26 (×4): qty 0.3

## 2022-04-26 MED ORDER — CIPROFLOXACIN IN D5W 200 MG/100ML IV SOLN
200.0000 mg | Freq: Two times a day (BID) | INTRAVENOUS | Status: AC
  Administered 2022-04-27 (×2): 200 mg via INTRAVENOUS
  Filled 2022-04-26 (×2): qty 100

## 2022-04-26 MED ORDER — VANCOMYCIN HCL IN DEXTROSE 1-5 GM/200ML-% IV SOLN
1000.0000 mg | INTRAVENOUS | Status: AC
  Administered 2022-04-26: 1000 mg via INTRAVENOUS
  Filled 2022-04-26: qty 200

## 2022-04-26 MED ORDER — FENTANYL CITRATE PF 50 MCG/ML IJ SOSY: 25.0000 ug | PREFILLED_SYRINGE | INTRAMUSCULAR | Status: DC | PRN

## 2022-04-26 MED ORDER — PHENYLEPHRINE HCL (PRESSORS) 10 MG/ML IV SOLN
INTRAVENOUS | Status: AC
  Filled 2022-04-26: qty 1

## 2022-04-26 MED ORDER — PHENYLEPHRINE HCL (PRESSORS) 10 MG/ML IV SOLN
INTRAVENOUS | Status: DC | PRN
  Administered 2022-04-26: 200 ug via INTRAVENOUS

## 2022-04-26 MED ORDER — KETOROLAC TROMETHAMINE 30 MG/ML IJ SOLN
INTRAMUSCULAR | Status: AC
  Filled 2022-04-26: qty 1

## 2022-04-26 MED ORDER — MIDAZOLAM HCL 2 MG/2ML IJ SOLN
1.0000 mg | Freq: Once | INTRAMUSCULAR | Status: AC
  Administered 2022-04-26: 1 mg via INTRAVENOUS

## 2022-04-26 SURGICAL SUPPLY — 52 items
BAG COUNTER SPONGE SURGICOUNT (BAG) IMPLANT
BAG ZIPLOCK 12X15 (MISCELLANEOUS) ×2 IMPLANT
BIT DRILL 2.5X2.75 QC CALB (BIT) ×1 IMPLANT
BLADE SURG 15 STRL LF DISP TIS (BLADE) ×2 IMPLANT
BLADE SURG 15 STRL SS (BLADE) ×4
BNDG ELASTIC 4X5.8 VLCR STR LF (GAUZE/BANDAGES/DRESSINGS) ×2 IMPLANT
BNDG ELASTIC 6X5.8 VLCR STR LF (GAUZE/BANDAGES/DRESSINGS) ×2 IMPLANT
BNDG GAUZE ELAST 4 BULKY (GAUZE/BANDAGES/DRESSINGS) ×2 IMPLANT
CHLORAPREP W/TINT 26 (MISCELLANEOUS) ×4 IMPLANT
COVER SURGICAL LIGHT HANDLE (MISCELLANEOUS) ×2 IMPLANT
CUFF TOURN SGL QUICK 34 (TOURNIQUET CUFF) ×2
CUFF TRNQT CYL 34X4.125X (TOURNIQUET CUFF) ×1 IMPLANT
DRAPE C-ARM 42X120 X-RAY (DRAPES) IMPLANT
DRAPE C-ARMOR (DRAPES) ×2 IMPLANT
DRAPE EXTREMITY T 121X128X90 (DISPOSABLE) ×2 IMPLANT
DRAPE U-SHAPE 47X51 STRL (DRAPES) ×2 IMPLANT
DRSG PAD ABDOMINAL 8X10 ST (GAUZE/BANDAGES/DRESSINGS) ×10 IMPLANT
ELECT REM PT RETURN 15FT ADLT (MISCELLANEOUS) ×2 IMPLANT
GAUZE SPONGE 4X4 12PLY STRL (GAUZE/BANDAGES/DRESSINGS) ×4 IMPLANT
GAUZE XEROFORM 1X8 LF (GAUZE/BANDAGES/DRESSINGS) ×2 IMPLANT
GLOVE BIOGEL PI IND STRL 7.5 (GLOVE) ×2 IMPLANT
GLOVE BIOGEL PI INDICATOR 7.5 (GLOVE) ×2
GLOVE INDICATOR 7.5 STRL GRN (GLOVE) ×2 IMPLANT
GOWN STRL REUS W/ TWL LRG LVL3 (GOWN DISPOSABLE) ×1 IMPLANT
GOWN STRL REUS W/TWL LRG LVL3 (GOWN DISPOSABLE) ×2
K-WIRE ACE 1.6X6 (WIRE) ×4
KIT BASIN OR (CUSTOM PROCEDURE TRAY) ×2 IMPLANT
KIT TURNOVER KIT A (KITS) IMPLANT
KWIRE ACE 1.6X6 (WIRE) IMPLANT
NS IRRIG 1000ML POUR BTL (IV SOLUTION) ×2 IMPLANT
PACK TOTAL JOINT (CUSTOM PROCEDURE TRAY) ×2 IMPLANT
PLATE LOCK 7H 92 BILAT FIB (Plate) ×1 IMPLANT
PLATE MEDIAL MALLEOLUS 4H HOOK (Plate) ×1 IMPLANT
PROTECTOR NERVE ULNAR (MISCELLANEOUS) ×2 IMPLANT
SCREW LOCK 3.5X10 DIST TIB (Screw) ×1 IMPLANT
SCREW LOCK 3.5X12 DIST TIB (Screw) ×1 IMPLANT
SCREW LOCK CORT STAR 3.5X10 (Screw) ×2 IMPLANT
SCREW LOCK PLATE R3 3.5X26 (Screw) ×1 IMPLANT
SCREW LP NON LOCK 3.5X10MM (Screw) ×1 IMPLANT
SCREW RE3CON NL 3.5X30 (Screw) ×1 IMPLANT
SPLINT PLASTER CAST XFAST 5X30 (CAST SUPPLIES) IMPLANT
SPLINT PLASTER XFAST SET 5X30 (CAST SUPPLIES) ×20
SPONGE T-LAP 18X18 ~~LOC~~+RFID (SPONGE) ×2 IMPLANT
STOCKINETTE 4X48 STRL (DRAPES) ×2 IMPLANT
SUCTION FRAZIER HANDLE 10FR (MISCELLANEOUS) ×2
SUCTION TUBE FRAZIER 10FR DISP (MISCELLANEOUS) ×1 IMPLANT
SUT ETHILON 3 0 PS 1 (SUTURE) ×6 IMPLANT
SUT VIC AB 2-0 SH 27 (SUTURE) ×2
SUT VIC AB 2-0 SH 27XBRD (SUTURE) ×1 IMPLANT
SUT VIC AB 3-0 SH 27 (SUTURE) ×4
SUT VIC AB 3-0 SH 27X BRD (SUTURE) ×2 IMPLANT
WATER STERILE IRR 1000ML POUR (IV SOLUTION) ×2 IMPLANT

## 2022-04-26 NOTE — Progress Notes (Signed)
Assisted Dr. Hart [place] with right, ultrasound guided adductor canal and popliteal blocks. Side rails up, monitors on throughout procedure. See vital signs in flow sheet. Tolerated Procedure well. ? ?

## 2022-04-26 NOTE — Progress Notes (Signed)
Subjective: ?Day of Surgery Procedure(s) (LRB): ?OPEN REDUCTION INTERNAL FIXATION (ORIF) ANKLE FRACTURE (Right) ? ?Patient reports pain as mild to moderate.  She is eager to proceed with surgery. Her husband went home overnight and is now back in town.  ? ?Objective:  ? ?VITALS:  Temp:  [97.6 ?F (36.4 ?C)-98.9 ?F (37.2 ?C)] 98.9 ?F (37.2 ?C) (05/12 1542) ?Pulse Rate:  [72-97] 97 (05/12 1542) ?Resp:  [14-18] 16 (05/12 1542) ?BP: (121-153)/(71-83) 153/83 (05/12 1542) ?SpO2:  [90 %-99 %] 93 % (05/12 1542) ? ?Gen: AAOx3, NAD ?  ?Right lower extremity: ?Well padded short leg splint in place ?Wiggles toes ?SILT over toes ?CR<2s ? ? ?LABS ?Recent Labs  ?  04/24/22 ?1609 04/25/22 ?0540  ?HGB 14.9 14.0  ?WBC 9.1 6.9  ?PLT 243 218  ? ?Recent Labs  ?  04/24/22 ?1609 04/25/22 ?0540  ?NA 144 140  ?K 3.9 3.8  ?CL 111 106  ?CO2 28 28  ?BUN 15 12  ?CREATININE 0.51 0.54  ?GLUCOSE 87 91  ? ?No results for input(s): LABPT, INR in the last 72 hours. ? ? ?Assessment/Plan: ?Ortho consult for known right trimalleolar ankle fracture (29 weeks old) now admitted to [facility] with hypoxemic respiratory failure in setting of COPD and current daily smoking ?  ?-history, exam and imaging reviewed again at length with the patient ?-plan today for ORIF right ankle with possible syndesmosis and/or deltoid fixation, possible external fixation with delayed definitive fixation ?-she has been NPO and VTE ppx held from [place] ?-strict NWB RLE, max elevation ?-patient evaluated by IM, pulmonology and anesthesiology teams and cleared for surgery as planned ?  ?[name], MD ?Orthopaedic Surgeon ?[facility] ?[phone] ?  ?The risks and benefits were presented and reviewed. The risks due to hardware failure/irritation, new/persistent infection, stiffness, nerve/vessel/tendon injury, nonunion/malunion, wound healing issues, development of arthritis, failure of this surgery, possibility of external fixation with delayed definitive surgery, need for  further surgery, thromboembolic events, anesthesia/medical complications, amputation, death among others were discussed. The patient acknowledged the explanation, agreed to proceed with the plan and a consent was signed. ? ? ?

## 2022-04-26 NOTE — H&P (Signed)
H&P Update: ? ?-History and Physical Reviewed ? ?-Patient has been re-examined ? ?-No change in the plan of care ? ?-The risks and benefits were presented and reviewed. The risks due to hardware failure/irritation, new/persistent infection, stiffness, nerve/vessel/tendon injury, nonunion/malunion, wound healing issues, development of arthritis, failure of this surgery, possibility of external fixation with delayed definitive surgery, need for further surgery, thromboembolic events, anesthesia/medical complications, amputation, death among others were discussed. The patient acknowledged the explanation, agreed to proceed with the plan and a consent was signed. ? ?[name] ? ?

## 2022-04-26 NOTE — Progress Notes (Addendum)
? ?NAME:  [name], MRN:  [id number], DOB:  07-09-1956, LOS: 0 ?ADMISSION DATE:  04/24/2022, CONSULTATION DATE:  04/25/2022 ?REFERRING MD:  Dr. Sharl Ma, CHIEF COMPLAINT:  Surgical clearance   ? ?History of Present Illness:  ?[name] is a 66 y.o. female with COPD, daily tobacco use, transverse myelitis, depression, PTSD, panic disorder, and chronic pain who presented to [facility] ED 5/10 for after being referred from outpatient surgical center for hypoxia, Patient suffered a ankle fracture end of April and was planned to undergo ORIF 5/10 but during pre-op workup patient was found hypoxic with oxygen saturations mid 80's on room air. Per chart review patient states that she generally is mildly hypoxic on medical assessments.  ? ?PCCM was consulted 5/11 for assistance in workup and management of hypoxia  ? ?Additional pulmonary history  ?Patient states that she has a 50 pack per year smoking history, she states that she has decided to quit smoking officially after this hospitalization. She denise any chronic cough or congestion. She does report intermittent expiratory wheeze that seems to be worse at night. She states she utilizes her Symbicort BID and typically needs her recuse inhaler 2-3x per week. Recently she was referred for sleep study per neurology due to excessive sleepiness, she also reports occasional snoring.  ? ?Pertinent  Medical History  ?COPD, daily tobacco use, transverse myelitis, depression, PTSD, panic disorder, and chronic pain ? ?Significant Hospital Events: ?Including procedures, antibiotic start and stop dates in addition to other pertinent events   ?5/10 presented originally for outpatient surgery where she was seen hypoxic and sent to ED for further evlauation ?5/11 PCCM consulted for further workup and management  ?5/12 ABG with mild hypercapnia, ECHO remains pending   ? ?Interim History / Subjective:  ?No acute events overnight, verbalizes understand of pulmonary plan moving forward    ? ?Objective   ?Blood pressure 121/71, pulse 72, temperature 97.6 ?F (36.4 ?C), temperature source Axillary, resp. rate 14, weight 40.8 kg, SpO2 93 %. ?   ?   ?No intake or output data in the 24 hours ending 04/26/22 1227 ? ?Filed Weights  ? 04/24/22 1217  ?Weight: 40.8 kg  ? ? ?Examination: ?General: Acute on chronically ill appearing middle aged female lying in bed on, in NAD ?HEENT: [place]/AT, MM pink/moist, PERRL,  ?Neuro: Alert and oriented, non-focal  ?CV: s1s2 regular rate and rhythm, no murmur, rubs, or gallops,  ?PULM:  Clear to ascultation, no increased work of breathing, no added breath sounds ?GI: soft, bowel sounds active in all 4 quadrants, non-tender, non-distended, tolerating oral diet ?Extremities: warm/dry, no edema, right foot In cast  ?Skin: no rashes or lesions ? ?Resolved Hospital Problem list   ? ? 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  ?-Pre-op ABG revealed mild hypercapnia  ?-Possibly secondary to underlying OSA verses underlying muscular dysfunction in the setting of her myelomalacia  ?Plan: ?Continued recommendation for for short duration of surgery with avoidance of paralytics as possible  ?Avoid use of pancuronium if possible  ?DVT prophylaxis ?Continue both Symbicort and Incruse while inpatient  ?Depending on insurance cost please provide wither incruse or Spiriva upon discharge along with preexisting Symbicort  ?Continue aggressive pulmonary hygiene including frequent IS ?Mobilize as able  ?Encouraged continued smoking cessation  ?If possible obtain ECHO during admission but this should not prevent discharge, this can be done in the outpatient  setting and patient was made aware of need for ECHO  ?Will arrange for outpatient pulmonary follow up with our [place] office at patient request ? ? ? ?PCCM will sign off. Thank you for the opportunity to participate in this patient's  care. Please contact if we can be of further assistance. ? ? ?Best Practice (right click and "Reselect all SmartList Selections" daily)  ?Per primary  ? ?Critical care time: NA  ?Jaedan Schuman D. Harris, NP-C ?[facility] ?Personal contact information can be found on Amion  ?04/26/2022, 12:27 PM ? ? ? ? ? ? ?

## 2022-04-26 NOTE — Anesthesia Postprocedure Evaluation (Signed)
Anesthesia Post Note ? ?Patient: [name] ? ?Procedure(s) Performed: OPEN REDUCTION INTERNAL FIXATION (ORIF) ANKLE FRACTURE (Right: Ankle) ? ?  ? ?Patient location during evaluation: PACU ?Anesthesia Type: Regional ?Level of consciousness: awake and alert ?Pain management: pain level controlled ?Vital Signs Assessment: post-procedure vital signs reviewed and stable ?Respiratory status: spontaneous breathing, nonlabored ventilation, respiratory function stable and patient connected to nasal cannula oxygen ?Cardiovascular status: stable and blood pressure returned to baseline ?Postop Assessment: no apparent nausea or vomiting ?Anesthetic complications: no ? ? ?No notable events documented. ? ?Last Vitals:  ?Vitals:  ? 04/26/22 2115 04/26/22 2125  ?BP: 110/64   ?Pulse: 68 75  ?Resp: 13 17  ?Temp: (!) 36.4 ?C   ?SpO2: 93% 98%  ?  ?Last Pain:  ?Vitals:  ? 04/26/22 2115  ?TempSrc:   ?PainSc: 0-No pain  ? ? ?  ?  ?  ?  ?  ?  ? ?[name] ? ? ? ? ?

## 2022-04-26 NOTE — Plan of Care (Signed)

## 2022-04-26 NOTE — Transfer of Care (Signed)
Immediate Anesthesia Transfer of Care Note ? ?Patient: [name] ? ?Procedure(s) Performed: OPEN REDUCTION INTERNAL FIXATION (ORIF) ANKLE FRACTURE (Right: Ankle) ? ?Patient Location: PACU ? ?Anesthesia Type:Regional ? ?Level of Consciousness: awake and alert  ? ?Airway & Oxygen Therapy: Patient Spontanous Breathing and Patient connected to face mask oxygen ? ?Post-op Assessment: Report given to RN and Post -op Vital signs reviewed and stable ? ?Post vital signs: Reviewed and stable ? ?Last Vitals:  ?Vitals Value Taken Time  ?BP    ?Temp    ?Pulse 67 04/26/22 2009  ?Resp 11 04/26/22 2009  ?SpO2 91 % 04/26/22 2009  ?Vitals shown include unvalidated device data. ? ?Last Pain:  ?Vitals:  ? 04/26/22 1645  ?TempSrc:   ?PainSc: 8   ?   ? ?Patients Stated Pain Goal: 3 (04/26/22 1600) ? ?Complications: No notable events documented. ?

## 2022-04-26 NOTE — Anesthesia Procedure Notes (Signed)
Anesthesia Regional Block: Popliteal block  ? ?Pre-Anesthetic Checklist: , timeout performed,  Correct Patient, Correct Site, Correct Laterality,  Correct Procedure, Correct Position, site marked,  Risks and benefits discussed,  Surgical consent,  Pre-op evaluation,  At surgeon's request and post-op pain management ? ?Laterality: Right ? ?Prep: chloraprep     ?  ?Needles:  ?Injection technique: Single-shot ? ?Needle Type: Echogenic Stimulator Needle   ? ? ?Needle Length: 9cm  ?Needle Gauge: 21  ? ? ? ?Additional Needles: ? ? ?Procedures:,,,, ultrasound used (permanent image in chart),,    ?Narrative:  ?Start time: 04/26/2022 4:50 PM ?End time: 04/26/2022 4:55 PM ?Injection made incrementally with aspirations every 5 mL. ? ?Performed by: Personally  ?Anesthesiologist: Shelton Silvas, MD ? ?Additional Notes: ?Patient tolerated the procedure well. Local anesthetic introduced in an incremental fashion under minimal resistance after negative aspirations. No paresthesias were elicited. After completion of the procedure, no acute issues were identified and patient continued to be monitored by RN.  ? ? ? ? ? ?

## 2022-04-26 NOTE — Op Note (Addendum)
04/26/2022 ? ?8:14 PM ? ? ?PATIENT: [name]  66 y.o. female ? ?MRN: [id number] ? ? ?PRE-OPERATIVE DIAGNOSIS:   ?Closed displaced right bimalleolar ankle fracture ? ? ?POST-OPERATIVE DIAGNOSIS:   ?Same ? ? ?PROCEDURE: ?Open reduction internal fixation of right bimalleolar ankle fracture ? ? ?SURGEON:  [name], MD ? ? ?ASSISTANT: None ? ? ?ANESTHESIA: Regional with IV sedation ? ? ?EBL: 20 cc ? ? ?TOURNIQUET:   ?Not used ? ? ?COMPLICATIONS: None apparent ? ? ?DISPOSITION: Extubated, awake and stable to recovery. ? ? ?INDICATION FOR PROCEDURE: ?The patient presented with right trimalleolar ankle fracture now admitted to [facility] with hypoxemic respiratory failure in setting of COPD and current daily smoking. She was evaluated by IM, pulmonology and anesthesiology teams and cleared for surgery as planned. ? ?We discussed the diagnosis, alternative treatment options, risks and benefits of the above surgical intervention, as well as alternative non-operative treatments. All questions/concerns were addressed and the patient/family demonstrated appropriate understanding of the diagnosis, the procedure, the postoperative course, and overall prognosis. The patient wished to proceed with surgical intervention and signed an informed surgical consent as such, in each others presence prior to surgery. ? ? ?PROCEDURE IN DETAIL: ?After preoperative consent was obtained and the correct operative site was identified, the patient was brought to the operating room supine on stretcher and transferred onto operating table. General anesthesia was induced. Preoperative antibiotics were administered. Surgical timeout was taken. The patient was then positioned supine with an ipsilateral hip bump. The operative lower extremity was prepped and draped. No tourniquet was used for this procedure. ? ?A standard lateral incision was made over the distal fibula. Dissection was carried down to the level of the fibula and the  fracture site identified. The superficial peroneal nerve was identified and protected throughout the procedure. The fibula was noted to be shortened with interposed periosteum. The fibula was brought out to length. The fibula fracture was debrided and the edges defined to achieve cortical read. Reduction maneuver was performed using pointed reduction forceps and lobster forceps. In this manner, the fibula length was restored and fracture reduced. The anterior fragment was reduced as part of this reduction. A lag screw was not placed given the orientation of fracture lines and comminution. Due to poor bone quality and extensive comminution at the fracture site, it was decided to use a locking distal fibula plate. We then selected a Zimmer composite locking plate to match the anatomy of the distal fibula and placed it laterally. This was implanted under intraoperative fluoroscopy with a combination of distal locking screws and proximal cortical & locking screws. ? ?We then made a direct medial ankle approach and extended this proximally in anticipation of implanting a hook plate. Dissection was carried down to the level of the medial malleolar fragment. A dental pick and freer elevator were used to reduce the medial malleolar fragment. A Paragon28 medial distal tibia hook plate was utilized to fix the reduced medial malleolar fragment and the tines were carefully inserted into the distal tip of the malleolus. We implanted a non locking screw and then a locking screw proximally to secure the plate. ? ?A manual external rotation stress radiograph was obtained after bimalleolar fixation and demonstrated symmetric stability of the ankle mortise to testing. ? ?The surgical sites were thoroughly irrigated. The tourniquet was deflated and hemostasis achieved. The deep layers were closed using 2-0 vicryl and the subcutaneous tissues closed using 3-0 vicryl. The skin was closed without tension using 3-0  nylon suture.  ?  ?The  leg was cleaned with saline and sterile xeroform dressings with gauze were applied. A well padded bulky short leg splint was applied. The patient was awakened from anesthesia and transported to the recovery room in stable condition.  ? ? ?FOLLOW UP PLAN: ?-transfer to PACU, then RNF ?-strict NWB operative extremity, maximum elevation ?-maintain short leg splint until follow up ?-DVT Ppx: Lovenox 30 mg once daily (or per primary team preference) ?-homegoing rx have been sent electronically thru outside EMR to her preferred pharmacy including: Oxy, Zofran and Colace ?-follow up as outpatient in 7-10 days for wound check ?-sutures out in 2-3 weeks with exchange of short leg splint to short leg cast in outpatient office ? ? ?RADIOGRAPHS: ?AP, lateral, oblique and stress radiographs of the right ankle were obtained intraoperatively. These showed interval reduction and fixation of the fractures. Manual stress radiographs were taken and the ankle mortise and tibiofibular relationship were noted to be stable following fixation. All hardware is appropriately positioned and of the appropriate lengths. No other acute injuries are noted. ? ? ?[name] ?Orthopaedic Surgery ?[facility] ? ? ?

## 2022-04-26 NOTE — Evaluation (Addendum)
SLP Cancellation Note ? ?Patient Details ?Name: [name] ?MRN: [id number] ?DOB: December 24, 1955 ? ? ?Cancelled treatment:       Reason Eval/Treat Not Completed: Other (comment) (pt npo for surgery, will continue efforts)  Per RN, pt may be having surgery at approx 1600 today - thus will follow up next day 04/27/2021.  Thanks for this order. ? ? ?Macario Golds ?04/26/2022, 8:40 AM ? ?Kathleen Lime, MS CCC SLP ?[facility] ?Office [phone] ?Pager [phone] ? ? ?

## 2022-04-26 NOTE — Progress Notes (Signed)
PT Cancellation Note ? ?Patient Details ?Name: [name] ?MRN: [id number] ?DOB: [birth year] ? ? ?Cancelled Treatment:    Reason Eval/Treat Not Completed: Medical issues which prohibited therapy: pt is scheduled for R ankle ORIF @ 1600 today, will hold therapy until after surgery is complete, will follow up s/p procedure.  ? ?Jamesetta Geralds, PT, DPT ?[facility] ?Office: [phone] ?Pager: [phone] ? ?Jamesetta Geralds ?04/26/2022, 9:28 AM ?

## 2022-04-26 NOTE — Anesthesia Procedure Notes (Signed)
Anesthesia Regional Block: Adductor canal block  ? ?Pre-Anesthetic Checklist: , timeout performed,  Correct Patient, Correct Site, Correct Laterality,  Correct Procedure, Correct Position, site marked,  Risks and benefits discussed,  Surgical consent,  Pre-op evaluation,  At surgeon's request and post-op pain management ? ?Laterality: Right ? ?Prep: chloraprep     ?  ?Needles:  ?Injection technique: Single-shot ? ?Needle Type: Echogenic Stimulator Needle   ? ? ?Needle Length: 9cm  ?Needle Gauge: 21  ? ? ? ?Additional Needles: ? ? ?Procedures:,,,, ultrasound used (permanent image in chart),,    ?Narrative:  ?Start time: 04/26/2022 4:55 PM ?End time: 04/26/2022 5:00 PM ?Injection made incrementally with aspirations every 5 mL. ? ?Performed by: Personally  ?Anesthesiologist: Shelton Silvas, MD ? ?Additional Notes: ?Patient tolerated the procedure well. Local anesthetic introduced in an incremental fashion under minimal resistance after negative aspirations. No paresthesias were elicited. After completion of the procedure, no acute issues were identified and patient continued to be monitored by RN.  ? ? ? ? ? ?

## 2022-04-26 NOTE — Progress Notes (Signed)
[facility] ? ?PROGRESS NOTE ? ?[name] YDX:[id number] DOB: 12-14-1956 DOA: 04/24/2022 ?PCP: Smith Robert, MD ? ? ?Brief HPI:   ?66 year old female with history of COPD, not on oxygen at home, myelomalacia presented with hypoxia.  Patient has history of COPD, not on home oxygen, recently had ankle injury ? and was supposed to undergo operative intervention by orthopedics, but during preop evaluation was found to be hypoxemic with O2 sats in mid 80s on room air.  She was sent to ED for further evaluation. ?In the ED CTA chest was done which was negative for PE. ? ? ?Subjective  ? ?Patient seen and examined, denies shortness of breath.  Plan for surgery today. ? ? Assessment/Plan:  ? ? 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 ??  COPD/emphysema ?-CT chest shows emphysema ?-Not in exacerbation at this time ? ?Ankle fracture ?-ORIF today ?-Pulmonology was consulted, patient has been cleared for surgery ?-Continue Vicodin as needed/Dilaudid IV as needed ? ?Preop eval ?-Patient able to walk a flight of stairs or 100 feet ?-EKG showed no acute ischemia ?-Patient is moderate risk for surgery ? ?Anxiety ?-Restart clonazepam 0.5 mg p.o. twice daily as needed ?-Change 2.5 mg p.o. 3 times daily as needed as per patient request ? ? ? ?Medications ? ?  ? [MAR Hold] enoxaparin (LOVENOX) injection  30 mg Subcutaneous Q24H  ? [MAR Hold] feeding supplement  237 mL Oral TID BM  ? [MAR Hold] gabapentin  600 mg Oral Q lunch  ? [MAR Hold] gabapentin  900 mg Oral BID  ? [MAR Hold] mometasone-formoterol  2 puff Inhalation BID  ? [MAR Hold] multivitamin with minerals  1 tablet Oral Daily  ? [MAR Hold] mupirocin ointment  1 application. Nasal BID  ? [MAR Hold] nicotine  21 mg Transdermal Daily  ? [MAR Hold] umeclidinium bromide  1 puff Inhalation Daily  ? ? ? Data Reviewed:  ? ?CBG: ? ?No results for input(s): GLUCAP in the last 168 hours. ? ?SpO2: 93 % ?O2  Flow Rate (L/min): 4 L/min ?FiO2 (%): 32 %  ? ? ?Vitals:  ? 04/26/22 0440 04/26/22 0835 04/26/22 1359 04/26/22 1542  ?BP: 121/71  131/76 (!) 153/83  ?Pulse: 72  89 97  ?Resp: 14  16 16   ?Temp: 97.6 ?F (36.4 ?C)  98.5 ?F (36.9 ?C) 98.9 ?F (37.2 ?C)  ?TempSrc: Axillary  Oral Oral  ?SpO2: 92% 93% 99% 93%  ?Weight:      ? ? ? ? ?Data Reviewed: ? ?Basic Metabolic Panel: ?Recent Labs  ?Lab 04/24/22 ?1609 04/25/22 ?0540  ?NA 144 140  ?K 3.9 3.8  ?CL 111 106  ?CO2 28 28  ?GLUCOSE 87 91  ?BUN 15 12  ?CREATININE 0.51 0.54  ?CALCIUM 8.4* 9.0  ? ? ?CBC: ?Recent Labs  ?Lab 04/24/22 ?1609 04/25/22 ?0540  ?WBC 9.1 6.9  ?NEUTROABS 5.9  --   ?HGB 14.9 14.0  ?HCT 45.2 44.3  ?MCV 102.0* 102.8*  ?PLT 243 218  ? ? ?LFT ?Recent Labs  ?Lab 04/25/22 ?0540  ?AST 18  ?ALT 12  ?ALKPHOS 66  ?BILITOT 0.9  ?PROT 6.2*  ?ALBUMIN 3.3*  ? ?  ?Antibiotics: ?Anti-infectives (From admission, onward)  ? ? Start     Dose/Rate Route Frequency Ordered Stop  ? 04/26/22 0600  vancomycin (VANCOCIN) IVPB 1000 mg/200 mL premix       ? 1,000 mg ?200 mL/hr over 60 Minutes Intravenous On call  to O.R. 04/26/22 0104 04/27/22 0559  ? 04/26/22 0600  ciprofloxacin (CIPRO) IVPB 400 mg       ? 400 mg ?200 mL/hr over 60 Minutes Intravenous On call to O.R. 04/26/22 0104 04/27/22 0559  ? ?  ? ? ? ?DVT prophylaxis: Lovenox ? ?Code Status: Full code ? ?Family Communication: Discussed with husband at bedside ? ? ?CONSULTS  ? ? ?Objective  ? ? ?Physical Examination: ? ? ?General-appears in no acute distress ?Heart-S1-S2, regular, no murmur auscultated ?Lungs-clear to auscultation bilaterally, no wheezing or crackles auscultated ?Abdomen-soft, nontender, no organomegaly ?Extremities-no edema in the lower extremities ?Neuro-alert, oriented x3, no focal deficit noted ? ? ?Status is: Inpatient: Acute hypoxemic respiratory failure ? ? ? ?  ? ? ? ? ? ?Meredeth Ide ?  ?[facility] ?If 7PM-7AM, please contact night-coverage at [email or web address], ?Office  [phone] ? ? ?04/26/2022,  3:51 PM  LOS: 0 days  ? ? ? ? ? ? ? ? ? ? ?  ?

## 2022-04-26 NOTE — Telephone Encounter (Signed)
Patient has been added for first week Dr. Craige Cotta is back in [facility] - 05/22/22 at 3:00 pm. Patient currently admitted so appt with show on AVS at D/C. Nothing further needed.  ?

## 2022-04-26 NOTE — Anesthesia Preprocedure Evaluation (Addendum)
Anesthesia Evaluation  ?Patient identified by MRN, date of birth, ID band ?Patient awake ? ? ? ?Reviewed: ?Allergy & Precautions, NPO status , Patient's Chart, lab work & pertinent test results ? ?Airway ?Mallampati: II ? ?TM Distance: >3 FB ?Neck ROM: Full ? ? ? Dental ? ?(+) Teeth Intact, Dental Advisory Given ?  ?Pulmonary ?COPD,  COPD inhaler, Patient abstained from smoking., former smoker,  ?  ? ?+ decreased breath sounds ? ? ? ? ? Cardiovascular ?negative cardio ROS ? ? ?Rhythm:Regular Rate:Normal ? ? ?  ?Neuro/Psych ?PSYCHIATRIC DISORDERS Anxiety Depression negative neurological ROS ?   ? GI/Hepatic ?negative GI ROS, Neg liver ROS,   ?Endo/Other  ?negative endocrine ROS ? Renal/GU ?negative Renal ROS  ? ?  ?Musculoskeletal ?negative musculoskeletal ROS ?(+)  ? Abdominal ?Normal abdominal exam  (+)   ?Peds ? Hematology ?negative hematology ROS ?(+)   ?Anesthesia Other Findings ? ? Reproductive/Obstetrics ? ?  ? ? ? ? ? ? ? ? ? ? ? ? ? ?  ?  ? ? ? ? ? ? ? ?Anesthesia Physical ?Anesthesia Plan ? ?ASA: 3 ? ?Anesthesia Plan: Regional  ? ?Post-op Pain Management: Regional block*  ? ?Induction: Intravenous ? ?PONV Risk Score and Plan: 3 and Ondansetron, Propofol infusion and Midazolam ? ?Airway Management Planned: Natural Airway and Simple Face Mask ? ?Additional Equipment: None ? ?Intra-op Plan:  ? ?Post-operative Plan:  ? ?Informed Consent: I have reviewed the patients History and Physical, chart, labs and discussed the procedure including the risks, benefits and alternatives for the proposed anesthesia with the patient or authorized representative who has indicated his/her understanding and acceptance.  ? ? ? ? ? ?Plan Discussed with: CRNA ? ?Anesthesia Plan Comments: (Lab Results ?     Component                Value               Date                 ?     WBC                      6.9                 04/25/2022           ?     HGB                      14.0                 04/25/2022           ?     HCT                      44.3                04/25/2022           ?     MCV                      102.8 (H)           04/25/2022           ?     PLT                      218  04/25/2022           ?)  ? ? ? ? ? ?Anesthesia Quick Evaluation ? ?

## 2022-04-26 NOTE — Telephone Encounter (Signed)
I am okay to double book or plug her in for a 3 pm appointment when I am in [place]. ?

## 2022-04-27 DIAGNOSIS — J9601 Acute respiratory failure with hypoxia: Secondary | ICD-10-CM | POA: Diagnosis not present

## 2022-04-27 DIAGNOSIS — J439 Emphysema, unspecified: Secondary | ICD-10-CM | POA: Diagnosis not present

## 2022-04-27 DIAGNOSIS — S82891G Other fracture of right lower leg, subsequent encounter for closed fracture with delayed healing: Secondary | ICD-10-CM | POA: Diagnosis not present

## 2022-04-27 LAB — ALPHA-1-ANTITRYPSIN: A-1 Antitrypsin, Ser: 180 mg/dL (ref 101–187)

## 2022-04-27 MED ORDER — WHITE PETROLATUM EX OINT
TOPICAL_OINTMENT | CUTANEOUS | Status: DC | PRN
  Filled 2022-04-27 (×2): qty 5

## 2022-04-27 MED ORDER — CLONAZEPAM 0.5 MG PO TABS
0.5000 mg | ORAL_TABLET | Freq: Once | ORAL | Status: AC
  Administered 2022-04-27: 0.5 mg via ORAL
  Filled 2022-04-27: qty 1

## 2022-04-27 MED ORDER — KETOROLAC TROMETHAMINE 30 MG/ML IJ SOLN
30.0000 mg | Freq: Once | INTRAMUSCULAR | Status: DC
Start: 2022-04-27 — End: 2022-04-27

## 2022-04-27 MED ORDER — POLYVINYL ALCOHOL 1.4 % OP SOLN
1.0000 [drp] | OPHTHALMIC | Status: DC | PRN
  Filled 2022-04-27 (×2): qty 15

## 2022-04-27 MED ORDER — BACLOFEN 10 MG PO TABS
10.0000 mg | ORAL_TABLET | Freq: Three times a day (TID) | ORAL | Status: DC
  Administered 2022-04-27 – 2022-04-30 (×10): 10 mg via ORAL
  Filled 2022-04-27 (×10): qty 1

## 2022-04-27 NOTE — Progress Notes (Signed)
[facility] ? ?PROGRESS NOTE ? ?[name] [id number] DOB: 1955/12/27 DOA: 04/24/2022 ?PCP: Vidal Schwalbe, MD ? ? ?Brief HPI:   ?66 year old female with history of COPD, not on oxygen at home, myelomalacia presented with hypoxia.  Patient has history of COPD, not on home oxygen, recently had ankle injury ? and was supposed to undergo operative intervention by orthopedics, but during preop evaluation was found to be hypoxemic with O2 sats in mid 80s on room air.  She was sent to ED for further evaluation. ?In the ED CTA chest was done which was negative for PE. ? ? ?Subjective  ? ?Patient seen and examined, underwent ORIF for closed displaced right bimalleolar ankle fracture.  Complains of pain. ? ? Assessment/Plan:  ? ? 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 ??  COPD/emphysema ?-CT chest shows emphysema ?-Not in exacerbation at this time ? ?S/p ORIF for right bimalleolar ankle fracture  ?-Continue Vicodin as needed/Dilaudid IV as needed ? ?Preop eval ?-Patient able to walk a flight of stairs or 100 feet ?-EKG showed no acute ischemia ?-Patient is moderate risk for surgery ? ?Anxiety ?-Restart clonazepam 0.5 mg p.o. twice daily as needed ?-Changed to 0.5 mg p.o. 3 times daily as needed as per patient request ? ? ? ?Medications ? ?  ? baclofen  10 mg Oral TID  ? enoxaparin (LOVENOX) injection  30 mg Subcutaneous Q24H  ? feeding supplement  237 mL Oral TID BM  ? gabapentin  600 mg Oral Q lunch  ? gabapentin  900 mg Oral BID  ? mometasone-formoterol  2 puff Inhalation BID  ? multivitamin with minerals  1 tablet Oral Daily  ? mupirocin ointment  1 application. Nasal BID  ? nicotine  21 mg Transdermal Daily  ? umeclidinium bromide  1 puff Inhalation Daily  ? ? ? Data Reviewed:  ? ?CBG: ? ?No results for input(s): GLUCAP in the last 168 hours. ? ?SpO2: 95 % ?O2 Flow Rate (L/min): 2 L/min ?FiO2 (%): 32 %  ? ? ?Vitals:  ? 04/26/22 2328  04/27/22 0502 04/27/22 0858 04/27/22 1407  ?BP: 114/65 (!) 142/71  (!) 155/73  ?Pulse:  72  91  ?Resp:    16  ?Temp: 97.8 ?F (36.6 ?C) 97.9 ?F (36.6 ?C)  98.2 ?F (36.8 ?C)  ?TempSrc: Oral Oral  Oral  ?SpO2: 93% 95% 94% 95%  ?Weight:      ? ? ? ? ?Data Reviewed: ? ?Basic Metabolic Panel: ?Recent Labs  ?Lab 04/24/22 ?1609 04/25/22 ?0540  ?NA 144 140  ?K 3.9 3.8  ?CL 111 106  ?CO2 28 28  ?GLUCOSE 87 91  ?BUN 15 12  ?CREATININE 0.51 0.54  ?CALCIUM 8.4* 9.0  ? ? ?CBC: ?Recent Labs  ?Lab 04/24/22 ?1609 04/25/22 ?0540  ?WBC 9.1 6.9  ?NEUTROABS 5.9  --   ?HGB 14.9 14.0  ?HCT 45.2 44.3  ?MCV 102.0* 102.8*  ?PLT 243 218  ? ? ?LFT ?Recent Labs  ?Lab 04/25/22 ?0540  ?AST 18  ?ALT 12  ?ALKPHOS 66  ?BILITOT 0.9  ?PROT 6.2*  ?ALBUMIN 3.3*  ? ?  ?Antibiotics: ?Anti-infectives (From admission, onward)  ? ? Start     Dose/Rate Route Frequency Ordered Stop  ? 04/27/22 0400  ciprofloxacin (CIPRO) IVPB 200 mg       ? 200 mg ?100 mL/hr over 60 Minutes Intravenous Every 12 hours 04/26/22 2026 04/28/22 0359  ? 04/26/22 1910  vancomycin (  VANCOCIN) powder  Status:  Discontinued       ?   As needed 04/26/22 1929 04/26/22 2128  ? 04/26/22 0600  vancomycin (VANCOCIN) IVPB 1000 mg/200 mL premix       ? 1,000 mg ?200 mL/hr over 60 Minutes Intravenous On call to O.R. 04/26/22 0104 04/26/22 1659  ? 04/26/22 0600  ciprofloxacin (CIPRO) IVPB 400 mg       ? 400 mg ?200 mL/hr over 60 Minutes Intravenous On call to O.R. 04/26/22 0104 04/26/22 1835  ? ?  ? ? ? ?DVT prophylaxis: Lovenox ? ?Code Status: Full code ? ?Family Communication: Discussed with husband at bedside ? ? ?CONSULTS  ? ? ?Objective  ? ? ?Physical Examination: ? ?General-appears in no acute distress ?Heart-S1-S2, regular, no murmur auscultated ?Lungs-clear to auscultation bilaterally, no wheezing or crackles auscultated ?Abdomen-soft, nontender, no organomegaly ?Extremities-right lower extremity in dressing ?Neuro-alert, oriented x3, no focal deficit noted ? ? ?Status is: Inpatient: Acute  hypoxemic respiratory failure ? ? ? ?  ? ? ? ? ? ?[name] ?  ?[facility] ?If 7PM-7AM, please contact night-coverage at [email or web address], ?Office  [phone] ? ? ?04/27/2022, 5:54 PM  LOS: 1 day  ? ? ? ? ? ? ? ? ? ? ?  ?

## 2022-04-27 NOTE — Evaluation (Signed)
Physical Therapy Evaluation ?Patient Details ?Name: [name] ?MRN: [id number] ?DOB: 04-12-1956 ?Today's Date: 04/27/2022 ? ?History of Present Illness ? Pt is a 66yo female presenting  to [facility] ED on 04/24/22 from sugery secondary to hypoxia while in pre-op for R ankle ORIF, satting mid-80s on RA.  PMH: COPD, myelomalacia, PTSD, panic disorder, depression, IBS. ? ?  ?Clinical Impression ? [name] is a 66 y.o. female POD 1 s/p Rt ankle ORIF. Patient reports independence with mobility at baseline. Patient is now limited by functional impairments (see PT problem list below) and requires min assist for transfers with RW. Patient was able to transfer from bed>BSC>recliner with RW and min assist. She took small steps/hops to move to recliner with min assist to steady and maintain NWB on Rt LE. Patient will benefit from continued skilled PT interventions to address impairments and progress towards PLOF. Acute PT will follow to progress mobility and stair training in preparation for safe discharge home.    ?   ? ?Recommendations for follow up therapy are one component of a multi-disciplinary discharge planning process, led by the attending physician.  Recommendations may be updated based on patient status, additional functional criteria and insurance authorization. ?PT Recommendation   ?Follow Up Recommendations Home health PT  [HHPT] Filed 04/27/2022 1126  ?Assistance recommended at discharge Frequent or constant Supervision/Assistance Filed 04/27/2022 1126  ?Patient can return home with the following A little help with walking and/or transfers, A little help with bathing/dressing/bathroom, Assistance with cooking/housework, Assist for transportation, Help with stairs or ramp for entrance, Direct supervision/assist for medications management Filed 04/27/2022 1126  ?Functional Status Assessment Patient has had a recent decline in their functional status and demonstrates the ability to make significant improvements  in function in a reasonable and predictable amount of time. Filed 04/27/2022 1126  ?PT equipment Rolling walker (2 wheels), Wheelchair (measurements PT), Wheelchair cushion (measurements PT)  [WC order to be sent to [facility] rather than filled at hospital] Filed 04/27/2022 1126  ? ? ?Precautions / Restrictions Precautions ?Precautions: Fall ?Restrictions ?Weight Bearing Restrictions: Yes ?RLE Weight Bearing: Non weight bearing  ? ?  ? ?Mobility ? Bed Mobility ?Overal bed mobility: Needs Assistance ?Bed Mobility: Supine to Sit ?  ?  ?Supine to sit: HOB elevated ?  ?  ?General bed mobility comments: assist to bring Rt LE off EOB and keep elevated for pain management. ?  ? ?Transfers ?Overall transfer level: Needs assistance ?Equipment used: Rolling walker (2 wheels) ?Transfers: Sit to/from Stand, Bed to chair/wheelchair/BSC ?Sit to Stand: Min assist ?Stand pivot transfers: Min assist ?  ?  ?  ?  ?General transfer comment: cues for hand placement on RW and NWB on Rt LE during stand. min assist to steady and complete power up . Min assist to pivot walker with bed>BSC transfer and therapist facilitated NWB on Rt LE with assist to lift LE. ?  ? ?Ambulation/Gait ?Ambulation/Gait assistance: Min assist ?Gait Distance (Feet): 4 Feet ?Assistive device: Rolling walker (2 wheels) ?Gait Pattern/deviations: Step-through pattern, Decreased weight shift to right (hop through) ?Gait velocity: decr ?  ?  ?General Gait Details: pt able to use UE's on RW and hop Lt foot forward to move BSC>recliner. min assist to steady, direct and advance walker, and lift Rt LE off ground to maintain NWB. ? ?Stairs ?  ?  ?  ?  ?  ? ?Wheelchair Mobility ?  ? ?Modified Rankin (Stroke Patients Only) ?  ? ?  ? ?Balance  Overall balance assessment: Needs assistance ?Sitting-balance support: Feet supported ?Sitting balance-Leahy Scale: Good ?  ?  ?Standing balance support: Reliant on assistive device for balance, During functional activity,  Bilateral upper extremity supported ?Standing balance-Leahy Scale: Poor ?  ?  ?  ?  ?  ?  ?  ?  ?  ?  ?  ?  ?   ? ? ? ?Pertinent Vitals/Pain Pain Assessment ?Pain Assessment: Faces ?Faces Pain Scale: Hurts whole lot ?Pain Location: Rt ankle ?Pain Descriptors / Indicators: Discomfort, Sore ?Pain Intervention(s): Limited activity within patient's tolerance, Monitored during session, Premedicated before session, Repositioned  ? ? ?Home Living Family/patient expects to be discharged to:: Private residence ?Living Arrangements: Spouse/significant other ?Available Help at Discharge: Family ?Type of Home: House ?Home Access: Ramped entrance ?  ?  ?  ?Home Layout: One level ?Home Equipment: Grab bars - toilet;Grab bars - tub/shower;Shower seat;BSC/3in1 ?   ?  ?Prior Function Prior Level of Function : Independent/Modified Independent ?  ?  ?  ?  ?  ?  ?  ?  ?  ? ? ?Hand Dominance  ? Dominant Hand: Right ? ?  ?Extremity/Trunk Assessment  ? Upper Extremity Assessment ?Upper Extremity Assessment: Overall WFL for tasks assessed ?  ? ?Lower Extremity Assessment ?Lower Extremity Assessment: Defer to PT evaluation ?  ? ?Cervical / Trunk Assessment ?Cervical / Trunk Assessment: Normal  ?Communication  ? Communication: No difficulties  ?Cognition Arousal/Alertness: Awake/alert ?Behavior During Therapy: [facility] for tasks assessed/performed ?Overall Cognitive Status: Within Functional Limits for tasks assessed ?  ?  ?  ?  ?  ?  ?  ?  ?  ?  ?  ?  ?  ?  ?  ?  ?  ?  ?  ? ?  ?General Comments   ? ?  ?Exercises    ? ? ? 04/27/22 1126  ?PT - End of Session  ?Equipment Utilized During Treatment Gait belt  ?Activity Tolerance Patient tolerated treatment well  ?Patient left with call bell/phone within reach;in chair;with chair alarm set;with family/visitor present  ?Nurse Communication Mobility status  ?PT Assessment  ?PT Recommendation/Assessment Patient needs continued PT services  ?PT Visit Diagnosis Muscle weakness (generalized)  (M62.81);Difficulty in walking, not elsewhere classified (R26.2);Other abnormalities of gait and mobility (R26.89);Pain  ?Pain - Right/Left Right  ?Pain - part of body Ankle and joints of foot  ?PT Problem List Decreased strength;Decreased range of motion;Decreased activity tolerance;Decreased balance;Decreased mobility;Decreased knowledge of use of DME;Decreased safety awareness;Decreased knowledge of precautions;Pain  ?PT Plan  ?PT Frequency (ACUTE ONLY) Min 5X/week  ?PT Treatment/Interventions (ACUTE ONLY) DME instruction;Stair training;Functional mobility training;Gait training;Therapeutic activities;Balance training;Therapeutic exercise;Patient/family education;Wheelchair mobility training  ?AM-PAC PT "6 Clicks" Mobility Outcome Measure (Version 2)  ?Help needed turning from your back to your side while in a flat bed without using bedrails? 3  ?Help needed moving from lying on your back to sitting on the side of a flat bed without using bedrails? 3  ?Help needed moving to and from a bed to a chair (including a wheelchair)? 3  ?Help needed standing up from a chair using your arms (e.g., wheelchair or bedside chair)? 3  ?Help needed to walk in hospital room? 3  ?Help needed climbing 3-5 steps with a railing?  2  ?6 Click Score 17  ?Consider Recommendation of Discharge To: Home with HH  ? ? ? ? ? 04/27/22 1126  ?PT Time Calculation  ?PT Start Time (ACUTE ONLY) 1122  ?PT Stop  Time (ACUTE ONLY) 1205  ?PT Time Calculation (min) (ACUTE ONLY) 43 min  ?PT General Charges  ?$$ ACUTE PT VISIT 1 Visit  ?PT Evaluation  ?$PT Eval Low Complexity 1 Low  ? ? ? ? ?Renaldo Fiddler PT, DPT ?Acute Rehabilitation Services ?Office [phone] ?Pager [phone]  ? ?Anitra Lauth ?04/27/2022, 3:42 PM ? ?

## 2022-04-27 NOTE — Evaluation (Signed)
Clinical/Bedside Swallow Evaluation ?Patient Details  ?Name: [name] ?MRN: [id number] ?Date of Birth: 1956-02-06 ? ?Today's Date: 04/27/2022 ?Time: SLP Start Time (ACUTE ONLY): 1424 SLP Stop Time (ACUTE ONLY): [id number] ?SLP Time Calculation (min) (ACUTE ONLY): 10 min ? ?Past Medical History:  ?Past Medical History:  ?Diagnosis Date  ? Chronic pain   ? gneralized denies fibromyalgia  ? COPD (chronic obstructive pulmonary disease) ([place])   ? Depression   ? Diverticulosis   ? Dyspnea   ? IBS (irritable bowel syndrome)   ? Myelomalacia ([place])   ? Panic disorder   ? Panic disorder   ? PTSD (post-traumatic stress disorder)   ? PTSD (post-traumatic stress disorder) 01/08/2019  ? Temporomandibular joint disorder (TMJ)   ? age 22 fractured TMJ - repaired by oral surgeon  ? ?Past Surgical History:  ?Past Surgical History:  ?Procedure Laterality Date  ? CHOLECYSTECTOMY    ? EYE SURGERY    ? FRACTURE SURGERY    ? LAPAROSCOPIC APPENDECTOMY N/A 01/08/2019  ? Procedure: APPENDECTOMY LAPAROSCOPIC;  Surgeon: Virl Cagey, MD;  Location: [facility];  Service: General;  Laterality: N/A;  ? ?HPI:  ?66 year old female presented with hypoxia.  Patient has history of COPD, not on home oxygen, recently had ankle injury and was supposed to undergo operative intervention by orthopedics, but during preop evaluation was found to be hypoxemic with O2 sats in mid 80s on room air.  She was sent to ED for further evaluation.  In the ED CTA showed "No definite evidence of pulmonary embolus. Mild bibasilar subsegmental atelectasis." Pt with history of COPD, not on oxygen at home, myelomalacia.  ?  ?Assessment / Plan / Recommendation  ?Clinical Impression ? Pt presents with functional swallowing as assessed clinically.  There were no clinical s/s of aspiration with any consistencies trialed and pt exhibited good oral clearance of solids.  Pt with recent dental surger in past month and has had some upper dentition removed.  She states this has  impacted her mastication and does choose softer foods at home at times.  Discussed diet preference pt who would like a mechanical soft diet; however, if she finds it unpalatable, she is safe to advance to a regular texture diet and choose softer, easier to choose foods as needed.   ? ?Recommend mechanical soft solids and thin liquids. ? ? ?SLP Visit Diagnosis: Dysphagia, oral phase (R13.11) ?   ?Aspiration Risk ? No limitations  ?  ?Diet Recommendation Dysphagia 3 (Mech soft);Thin liquid  ? ?Liquid Administration via: Cup;Straw ?Medication Administration: Whole meds with liquid ?Supervision: Patient able to self feed ?Compensations: Slow rate;Small sips/bites ?Postural Changes: Seated upright at 90 degrees  ?  ?Other  Recommendations Oral Care Recommendations: Oral care BID   ? ?Recommendations for follow up therapy are one component of a multi-disciplinary discharge planning process, led by the attending physician.  Recommendations may be updated based on patient status, additional functional criteria and insurance authorization. ? ?Follow up Recommendations No SLP follow up  ? ? ?  ?Assistance Recommended at Discharge None  ?Functional Status Assessment Patient has not had a recent decline in their functional status  ?Frequency and Duration min 1 x/week  ?1 week ?  ?   ? ?Prognosis Prognosis for Safe Diet Advancement: Good  ? ?  ? ?Swallow Study   ?General HPI: 66 year old female presented with hypoxia.  Patient has history of COPD, not on home oxygen, recently had ankle injury and was supposed to undergo operative intervention by  orthopedics, but during preop evaluation was found to be hypoxemic with O2 sats in mid 80s on room air.  She was sent to ED for further evaluation.  In the ED CTA showed "No definite evidence of pulmonary embolus. Mild bibasilar subsegmental atelectasis." Pt with history of COPD, not on oxygen at home, myelomalacia. ?Type of Study: Bedside Swallow Evaluation ?Diet Prior to this Study:  NPO ?Temperature Spikes Noted: No ?Respiratory Status: Room air ?History of Recent Intubation: No ?Behavior/Cognition: Alert;Cooperative;Pleasant mood ?Oral Cavity Assessment: Within Functional Limits ?Oral Care Completed by SLP: No ?Oral Cavity - Dentition: Adequate natural dentition;Missing dentition ?Vision: Functional for self-feeding ?Self-Feeding Abilities: Able to feed self ?Patient Positioning: Upright in bed ?Baseline Vocal Quality: Normal ?Volitional Cough: Strong ?Volitional Swallow: Able to elicit  ?  ?Oral/Motor/Sensory Function Overall Oral Motor/Sensory Function: Within functional limits ?Facial ROM: Within Functional Limits ?Facial Symmetry: Within Functional Limits ?Lingual ROM: Within Functional Limits ?Lingual Symmetry: Within Functional Limits ?Lingual Strength: Within Functional Limits ?Velum: Within Functional Limits ?Mandible: Within Functional Limits   ?Ice Chips Ice chips: Not tested   ?Thin Liquid Thin Liquid: Within functional limits ?Presentation: Straw  ?  ?Nectar Thick Nectar Thick Liquid: Not tested   ?Honey Thick Honey Thick Liquid: Not tested   ?Puree Puree: Not tested   ?Solid ? ? ?  Solid: Within functional limits ?Presentation: Self Fed  ? ?  ? ?Corbyn Steedman E Anadelia Kintz, MA, CCC-SLP ?Acute Rehabilitation Services ?Office: [phone] ?04/27/2022,2:45 PM ? ? ? ?

## 2022-04-27 NOTE — Progress Notes (Signed)
? ?  Subjective: ?1 Day Post-Op Procedure(s) (LRB): ?OPEN REDUCTION INTERNAL FIXATION (ORIF) ANKLE FRACTURE (Right)  ?C/o severe pain with mild tingling in the toes ?Denies any other symptoms ?Otherwise doing fair ?Patient reports pain as moderate. ? ?Objective:  ? ?VITALS:   ?Vitals:  ? 04/26/22 2328 04/27/22 0502  ?BP: 114/65 (!) 142/71  ?Pulse:  72  ?Resp:    ?Temp: 97.8 ?F (36.6 ?C) 97.9 ?F (36.6 ?C)  ?SpO2: 93% 95%  ? ? ?Right ankle: splint in place ?Nv intact distally ?No drainage or signs of infection ? ?LABS ?Recent Labs  ?  04/24/22 ?1609 04/25/22 ?0540  ?HGB 14.9 14.0  ?HCT 45.2 44.3  ?WBC 9.1 6.9  ?PLT 243 218  ? ? ?Recent Labs  ?  04/24/22 ?1609 04/25/22 ?0540  ?NA 144 140  ?K 3.9 3.8  ?BUN 15 12  ?CREATININE 0.51 0.54  ?GLUCOSE 87 91  ? ? ? ?Assessment/Plan: ?1 Day Post-Op Procedure(s) (LRB): ?OPEN REDUCTION INTERNAL FIXATION (ORIF) ANKLE FRACTURE (Right) ?Pain management ?Strict non weight bearing right lower extremity ?PT/OT as able ?Will continue to monitor her progress ? ? ? ?Brad Napolean Sia PA-C, MPAS ?[facility]  [place] ?[address], [address], [place], [place] [address] ?Phone: [phone] ?[email or web address] ?Facebook  [employer], broadcasting/film/video  ? ? ? ?  ?

## 2022-04-27 NOTE — Evaluation (Signed)
Occupational Therapy Evaluation ?Patient Details ?Name: [name] ?MRN: [id number] ?DOB: 09-Oct-1956 ?Today's Date: 04/27/2022 ? ? ?History of Present Illness Pt is a 66yo female presenting  to [facility] ED on 04/24/22 from sugery secondary to hypoxia while in pre-op for R ankle ORIF, satting mid-80s on RA.  PMH: COPD, myelomalacia, PTSD, panic disorder, depression, IBS.  ? ?Clinical Impression ?  ?Patient is a 66 year old female who was admitted for above. Patient was independent prior to fall. Patient is currently limited by pain and fatigue from dietary restrictions on this date limiting evaluation. Patient was noted to have increased fear of falling, increased pain in RLE, decreased endurance, decreased functional activity tolerance, decreased standing balance and decreased ability to maintain NWB restrictions during ADLs impacting participation. Patient would continue to benefit from skilled OT services at this time while admitted and after d/c to address noted deficits in order to improve overall safety and independence in ADLs.  ?  ?   ? ?Recommendations for follow up therapy are one component of a multi-disciplinary discharge planning process, led by the attending physician.  Recommendations may be updated based on patient status, additional functional criteria and insurance authorization.  ? ?Follow Up Recommendations ? Skilled nursing-short term rehab (<3 hours/day)  ?  ?Assistance Recommended at Discharge Frequent or constant Supervision/Assistance  ?Patient can return home with the following A lot of help with walking and/or transfers;A lot of help with bathing/dressing/bathroom;Assistance with cooking/housework;Direct supervision/assist for financial management;Assist for transportation;Help with stairs or ramp for entrance;Direct supervision/assist for medications management ? ?  ?Functional Status Assessment ? Patient has had a recent decline in their functional status and demonstrates the ability to make  significant improvements in function in a reasonable and predictable amount of time.  ?Equipment Recommendations ? BSC/3in1  ?  ?Recommendations for Other Services   ? ? ?  ?Precautions / Restrictions Precautions ?Precautions: Fall ?Restrictions ?Weight Bearing Restrictions: Yes ?RLE Weight Bearing: Non weight bearing  ? ?  ? ?Mobility Bed Mobility ?Overal bed mobility: Needs Assistance ?Bed Mobility: Supine to Sit ?  ?  ?Supine to sit: Min assist, HOB elevated ?  ?  ?General bed mobility comments: with increased time with physical assist to move RLE ?  ? ?Transfers ?  ?  ?  ?  ?  ?  ?  ?  ?  ?  ?  ? ?  ?Balance Overall balance assessment: Needs assistance ?Sitting-balance support: Feet supported, No upper extremity supported ?Sitting balance-Leahy Scale: Fair ?  ?  ?  ?Standing balance-Leahy Scale: Zero ?Standing balance comment: unable to assess today ?  ?  ?  ?  ?  ?  ?  ?  ?  ?  ?  ?   ? ?ADL either performed or assessed with clinical judgement  ? ?ADL Overall ADL's : Needs assistance/impaired ?Eating/Feeding: Sitting ?Eating/Feeding Details (indicate cue type and reason): patient was just evaluated by SLP to be taken off liquid diet just prior to OT session ?Grooming: Set up;Sitting ?Grooming Details (indicate cue type and reason): patient noted to have IV placed on L distal hand with patient reporting increased pain with use. ?Upper Body Bathing: Minimal assistance;Sitting ?  ?Lower Body Bathing: Bed level;Moderate assistance ?  ?Upper Body Dressing : Set up;Sitting ?  ?Lower Body Dressing: Bed level;Maximal assistance ?  ?Toilet Transfer: Maximal assistance ?Toilet Transfer Details (indicate cue type and reason): patient declined to transfer at this time reporting that she had increased pain with foot  in dependent position. paitent was min A to scoot from middle of bed to head of bed with increased time ?Toileting- Clothing Manipulation and Hygiene: Total assistance;Bed level ?  ?  ?  ?  ?   ? ? ? ?Vision  Patient Visual Report: No change from baseline ?   ?   ?Perception   ?  ?Praxis   ?  ? ?Pertinent Vitals/Pain Pain Assessment ?Pain Assessment: Faces ?Pain Location: RLE ?Pain Descriptors / Indicators: Discomfort, Grimacing, Throbbing ?Pain Intervention(s): Limited activity within patient's tolerance, Monitored during session, Repositioned, Premedicated before session  ? ? ? ?Hand Dominance Right ?  ?Extremity/Trunk Assessment Upper Extremity Assessment ?Upper Extremity Assessment: Overall WFL for tasks assessed ?  ?Lower Extremity Assessment ?Lower Extremity Assessment: Defer to PT evaluation ?  ?Cervical / Trunk Assessment ?Cervical / Trunk Assessment: Normal ?  ?Communication Communication ?Communication: No difficulties ?  ?Cognition Arousal/Alertness: Awake/alert ?Behavior During Therapy: [facility] IncWFL for tasks assessed/performed ?Overall Cognitive Status: Within Functional Limits for tasks assessed ?  ?  ?  ?  ?  ?  ?  ?  ?  ?  ?  ?  ?  ?  ?  ?  ?General Comments: patient was noted to be tearful at times during session with adjustment to current status and fear of falling. ?  ?  ?General Comments    ? ?  ?Exercises   ?  ?Shoulder Instructions    ? ? ?Home Living Family/patient expects to be discharged to:: Private residence ?Living Arrangements: Spouse/significant other ?Available Help at Discharge: Family ?Type of Home: House ?Home Access: Ramped entrance ?  ?  ?Home Layout: One level ?  ?  ?Bathroom Shower/Tub: Walk-in shower ?  ?Bathroom Toilet: Standard ?Bathroom Accessibility: Yes ?  ?Home Equipment: Grab bars - toilet;Grab bars - tub/shower;Shower seat;BSC/3in1 ?  ?  ?  ? ?  ?Prior Functioning/Environment Prior Level of Function : Independent/Modified Independent ?  ?  ?  ?  ?  ?  ?  ?  ?  ? ?  ?  ?OT Problem List: Decreased strength;Decreased activity tolerance;Impaired balance (sitting and/or standing);Decreased safety awareness;Decreased cognition;Cardiopulmonary status limiting activity;Decreased knowledge of  precautions;Decreased knowledge of use of DME or AE;Pain ?  ?   ?OT Treatment/Interventions: Self-care/ADL training;Therapeutic exercise;Neuromuscular education;Energy conservation;DME and/or AE instruction;Therapeutic activities;Balance training;Patient/family education  ?  ?OT Goals(Current goals can be found in the care plan section) Acute Rehab OT Goals ?Patient Stated Goal: to get better ?OT Goal Formulation: With patient ?Time For Goal Achievement: 05/11/22 ?Potential to Achieve Goals: Good  ?OT Frequency: Min 2X/week ?  ? ?Co-evaluation   ?  ?  ?  ?  ? ?  ?AM-PAC OT "6 Clicks" Daily Activity     ?Outcome Measure Help from another person eating meals?: A Little ?Help from another person taking care of personal grooming?: A Little ?Help from another person toileting, which includes using toliet, bedpan, or urinal?: A Lot ?Help from another person bathing (including washing, rinsing, drying)?: A Lot ?Help from another person to put on and taking off regular upper body clothing?: A Little ?Help from another person to put on and taking off regular lower body clothing?: A Lot ?6 Click Score: 15 ?  ?End of Session Nurse Communication: Other (comment) (ok to participate in session) ? ?Activity Tolerance: Patient tolerated treatment well ?Patient left: in bed;with call bell/phone within reach ? ?OT Visit Diagnosis: Unsteadiness on feet (R26.81);Pain;History of falling (Z91.81)  ?              ?  Time: 7412-8786 ?OT Time Calculation (min): 23 min ?Charges:  OT General Charges ?$OT Visit: 1 Visit ?OT Evaluation ?$OT Eval Moderate Complexity: 1 Mod ?OT Treatments ?$Self Care/Home Management : 8-22 mins ? ?[name] OTR/L, MS ?Acute Rehabilitation Department ?Office# [phone] ?Pager# [phone] ? ? ?Barnabas Lister Ahuva Poynor ?04/27/2022, 4:09 PM ?

## 2022-04-28 DIAGNOSIS — J9601 Acute respiratory failure with hypoxia: Secondary | ICD-10-CM | POA: Diagnosis not present

## 2022-04-28 DIAGNOSIS — J439 Emphysema, unspecified: Secondary | ICD-10-CM | POA: Diagnosis not present

## 2022-04-28 DIAGNOSIS — S82891G Other fracture of right lower leg, subsequent encounter for closed fracture with delayed healing: Secondary | ICD-10-CM | POA: Diagnosis not present

## 2022-04-28 LAB — CBC
HCT: 46.1 % — ABNORMAL HIGH (ref 36.0–46.0)
Hemoglobin: 15.2 g/dL — ABNORMAL HIGH (ref 12.0–15.0)
MCH: 32.5 pg (ref 26.0–34.0)
MCHC: 33 g/dL (ref 30.0–36.0)
MCV: 98.5 fL (ref 80.0–100.0)
Platelets: 220 10*3/uL (ref 150–400)
RBC: 4.68 MIL/uL (ref 3.87–5.11)
RDW: 13.6 % (ref 11.5–15.5)
WBC: 10.1 10*3/uL (ref 4.0–10.5)
nRBC: 0 % (ref 0.0–0.2)

## 2022-04-28 LAB — BASIC METABOLIC PANEL
Anion gap: 10 (ref 5–15)
BUN: 8 mg/dL (ref 8–23)
CO2: 28 mmol/L (ref 22–32)
Calcium: 9.3 mg/dL (ref 8.9–10.3)
Chloride: 104 mmol/L (ref 98–111)
Creatinine, Ser: 0.42 mg/dL — ABNORMAL LOW (ref 0.44–1.00)
GFR, Estimated: >60 mL/min (ref >60)
Glucose, Bld: 95 mg/dL (ref 70–99)
Potassium: 3.1 mmol/L — ABNORMAL LOW (ref 3.5–5.1)
Sodium: 142 mmol/L (ref 135–145)

## 2022-04-28 MED ORDER — HYDROMORPHONE HCL 1 MG/ML IJ SOLN
0.5000 mg | Freq: Once | INTRAMUSCULAR | Status: AC
  Administered 2022-04-28: 0.5 mg via INTRAVENOUS
  Filled 2022-04-28: qty 0.5

## 2022-04-28 MED ORDER — POTASSIUM CHLORIDE 10 MEQ/100ML IV SOLN
10.0000 meq | INTRAVENOUS | Status: AC
  Administered 2022-04-28 (×3): 10 meq via INTRAVENOUS
  Filled 2022-04-28 (×3): qty 100

## 2022-04-28 MED ORDER — OXYCODONE-ACETAMINOPHEN 5-325 MG PO TABS
1.0000 | ORAL_TABLET | Freq: Four times a day (QID) | ORAL | Status: DC | PRN
  Administered 2022-04-28 – 2022-04-30 (×8): 1 via ORAL
  Filled 2022-04-28 (×8): qty 1

## 2022-04-28 NOTE — Plan of Care (Signed)

## 2022-04-28 NOTE — Progress Notes (Addendum)
Patient requested to have pain medication every 2 hours. RN assessed patient and gave pain medication PRN as MD ordered. Patient requested RN to call surgeon at 0230 to asked for pain medication as she stated she needs more than the order right now. Patient was irritated when RN said it is night time and the office is closed, RN left a message as patient requested.  ?RN paged MD on call as well. Waiting for new order. ?

## 2022-04-28 NOTE — Progress Notes (Addendum)
[facility] ? ?PROGRESS NOTE ? ?[name] VPX:[id number] DOB: 07-06-1956 DOA: 04/24/2022 ?PCP: Smith Robert, MD ? ? ?Brief HPI:   ?66 year old female with history of COPD, not on oxygen at home, myelomalacia presented with hypoxia.  Patient has history of COPD, not on home oxygen, recently had ankle injury ? and was supposed to undergo operative intervention by orthopedics, but during preop evaluation was found to be hypoxemic with O2 sats in mid 80s on room air.  She was sent to ED for further evaluation. ?In the ED CTA chest was done which was negative for PE. ? ? ?Subjective  ? ?Patient seen and examined, still complains of pain in the right lower extremity.  She is on IV Dilaudid and Vicodin as needed ? ? Assessment/Plan:  ? ? 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 ??  COPD/emphysema ?-CT chest shows emphysema ?-Not in exacerbation at this time ? ?S/p ORIF for right bimalleolar ankle fracture  ?-We will switch from Vicodin to Percocet ?-Continue IV Dilaudid as needed for pain ? ? ?Preop eval ?-Patient able to walk a flight of stairs or 100 feet ?-EKG showed no acute ischemia ?-Patient is moderate risk for surgery ? ?Anxiety ?-Restart clonazepam 0.5 mg p.o. twice daily as needed ?-Changed to 0.5 mg p.o. 3 times daily as needed as per patient request ? ?Hypokalemia ?-Potassium was 3.1 ?-Replace potassium and follow BMP in am ? ?Medications ? ?  ? baclofen  10 mg Oral TID  ? enoxaparin (LOVENOX) injection  30 mg Subcutaneous Q24H  ? feeding supplement  237 mL Oral TID BM  ? gabapentin  600 mg Oral Q lunch  ? gabapentin  900 mg Oral BID  ? mometasone-formoterol  2 puff Inhalation BID  ? multivitamin with minerals  1 tablet Oral Daily  ? mupirocin ointment  1 application. Nasal BID  ? nicotine  21 mg Transdermal Daily  ? umeclidinium bromide  1 puff Inhalation Daily  ? ? ? Data Reviewed:   ? ?CBG: ? ?No results for input(s): GLUCAP in the last 168 hours. ? ?SpO2: 97 % ?O2 Flow Rate (L/min): 2 L/min ?FiO2 (%): 32 %  ? ? ?Vitals:  ? 04/27/22 1951 04/27/22 2017 04/28/22 0627 04/28/22 1304  ?BP: (!) 145/91  (!) 161/84 (!) 163/80  ?Pulse: 92  79 (!) 53  ?Resp: 16  16 20   ?Temp: 98.7 ?F (37.1 ?C)  98 ?F (36.7 ?C) 98 ?F (36.7 ?C)  ?TempSrc: Oral  Oral   ?SpO2: 94% 94% 94% 97%  ?Weight:      ? ? ? ? ?Data Reviewed: ? ?Basic Metabolic Panel: ?Recent Labs  ?Lab 04/24/22 ?1609 04/25/22 ?06/25/22 04/28/22 ?04/30/22  ?NA 144 140 142  ?K 3.9 3.8 3.1*  ?CL 111 106 104  ?CO2 28 28 28   ?GLUCOSE 87 91 95  ?BUN 15 12 8   ?CREATININE 0.51 0.54 0.42*  ?CALCIUM 8.4* 9.0 9.3  ? ? ?CBC: ?Recent Labs  ?Lab 04/24/22 ?1609 04/25/22 ? 04/28/22 ?06/25/22  ?WBC 9.1 6.9 10.1  ?NEUTROABS 5.9  --   --   ?HGB 14.9 14.0 15.2*  ?HCT 45.2 44.3 46.1*  ?MCV 102.0* 102.8* 98.5  ?PLT 243 218 220  ? ? ?LFT ?Recent Labs  ?Lab 04/25/22 ?0540  ?AST 18  ?ALT 12  ?ALKPHOS 66  ?BILITOT 0.9  ?PROT 6.2*  ?ALBUMIN 3.3*  ? ?  ?Antibiotics: ?Anti-infectives (  From admission, onward)  ? ? Start     Dose/Rate Route Frequency Ordered Stop  ? 04/27/22 0400  ciprofloxacin (CIPRO) IVPB 200 mg       ? 200 mg ?100 mL/hr over 60 Minutes Intravenous Every 12 hours 04/26/22 2026 04/27/22 1825  ? 04/26/22 1910  vancomycin (VANCOCIN) powder  Status:  Discontinued       ?   As needed 04/26/22 1929 04/26/22 2128  ? 04/26/22 0600  vancomycin (VANCOCIN) IVPB 1000 mg/200 mL premix       ? 1,000 mg ?200 mL/hr over 60 Minutes Intravenous On call to O.R. 04/26/22 0104 04/26/22 1659  ? 04/26/22 0600  ciprofloxacin (CIPRO) IVPB 400 mg       ? 400 mg ?200 mL/hr over 60 Minutes Intravenous On call to O.R. 04/26/22 0104 04/26/22 1835  ? ?  ? ? ? ?DVT prophylaxis: Lovenox ? ?Code Status: Full code ? ?Family Communication: Discussed with husband at bedside ? ? ?CONSULTS  ? ? ?Objective  ? ? ?Physical Examination: ? ?General-appears in no acute distress ?Heart-S1-S2, regular, no murmur  auscultated ?Lungs-clear to auscultation bilaterally, no wheezing or crackles auscultated ?Abdomen-soft, nontender, no organomegaly ?Extremities-right lower extremity in splint  ?Neuro-alert, oriented x3, no focal deficit noted ? ? ?Status is: Inpatient: Acute hypoxemic respiratory failure ? ? ? ?  ? ? ?Meredeth Ide ?  ?[facility] ?If 7PM-7AM, please contact night-coverage at [email or web address], ?Office  [phone] ? ? ?04/28/2022, 3:33 PM  LOS: 2 days  ? ? ? ? ? ? ? ? ? ? ?  ?

## 2022-04-29 ENCOUNTER — Other Ambulatory Visit: Payer: Self-pay

## 2022-04-29 ENCOUNTER — Encounter (HOSPITAL_COMMUNITY): Payer: Self-pay | Admitting: Orthopaedic Surgery

## 2022-04-29 DIAGNOSIS — S82891G Other fracture of right lower leg, subsequent encounter for closed fracture with delayed healing: Secondary | ICD-10-CM | POA: Diagnosis not present

## 2022-04-29 DIAGNOSIS — J439 Emphysema, unspecified: Secondary | ICD-10-CM | POA: Diagnosis not present

## 2022-04-29 DIAGNOSIS — J9601 Acute respiratory failure with hypoxia: Secondary | ICD-10-CM | POA: Diagnosis not present

## 2022-04-29 LAB — BASIC METABOLIC PANEL
Anion gap: 11 (ref 5–15)
BUN: 11 mg/dL (ref 8–23)
CO2: 28 mmol/L (ref 22–32)
Calcium: 9.4 mg/dL (ref 8.9–10.3)
Chloride: 101 mmol/L (ref 98–111)
Creatinine, Ser: 0.47 mg/dL (ref 0.44–1.00)
GFR, Estimated: >60 mL/min (ref >60)
Glucose, Bld: 99 mg/dL (ref 70–99)
Potassium: 3.4 mmol/L — ABNORMAL LOW (ref 3.5–5.1)
Sodium: 140 mmol/L (ref 135–145)

## 2022-04-29 MED ORDER — POTASSIUM CHLORIDE CRYS ER 20 MEQ PO TBCR
40.0000 meq | EXTENDED_RELEASE_TABLET | Freq: Once | ORAL | Status: AC
  Administered 2022-04-29: 40 meq via ORAL
  Filled 2022-04-29: qty 2

## 2022-04-29 NOTE — Progress Notes (Addendum)
EQUIPMENT NEEDS for home ? ?  16 inch wheelchair + cushion ?Patient suffers from a Fx ankle which impairs their ability to perform daily activities like walking in the home.  A walker alone will not resolve the issues with performing activities of daily living. A wheelchair will allow patient to safely perform daily activities.  The patient can self propel in the home or has a caregiver who can provide assistance.    ? ?2.    BSC ?3.    Youth RW ? ?Felecia Shelling  PTA ?Acute  Rehabilitation Services ?Pager      [phone] ?Office      [phone] ? ?

## 2022-04-29 NOTE — Progress Notes (Signed)
Speech Language Pathology Treatment: Dysphagia  ?Patient Details ?Name: [name] ?MRN: [id number] ?DOB: 1956/07/12 ?Today's Date: 04/29/2022 ?Time: 3893-7342 ?SLP Time Calculation (min) (ACUTE ONLY): 15 min ? ?Assessment / Plan / Recommendation ?Clinical Impression ? Patient seen by SLP for skilled therapy session focused on dysphagia goals. Patient awake and alert with lunch tray on table but patient reporting the aroma of the food turned her off to trying it. She also reported that she has not been wanting most meats. SLP discussed with patient options regarding solid textures as currently she is on Dys 3 (mechanical soft) but she could be upgraded to regular texture solids if she felt comfortable with ordering foods that she feels she could tolerate. Patient in agreement with this and SLP upgraded her to regular solids. Patient did drink a small amount of liquids and SLP did not observe any overt s/s aspiration or penetration. Patient is fully capable of ordering/choosing food textures that she can tolerate. SLP to s/o at this time. ? ?  ?HPI HPI: 66 year old female presented with hypoxia.  Patient has history of COPD, not on home oxygen, recently had ankle injury and was supposed to undergo operative intervention by orthopedics, but during preop evaluation was found to be hypoxemic with O2 sats in mid 80s on room air.  She was sent to ED for further evaluation.  In the ED CTA showed "No definite evidence of pulmonary embolus. Mild bibasilar subsegmental atelectasis." Pt with history of COPD, not on oxygen at home, myelomalacia. ?  ?   ?SLP Plan ? All goals met;Discharge SLP treatment due to (comment) ? ?  ?  ?Recommendations for follow up therapy are one component of a multi-disciplinary discharge planning process, led by the attending physician.  Recommendations may be updated based on patient status, additional functional criteria and insurance authorization. ?  ? ?Recommendations  ?Diet recommendations:  Regular;Thin liquid ?Liquids provided via: Cup;Straw ?Medication Administration: Whole meds with liquid ?Supervision: Patient able to self feed ?Compensations: Slow rate;Small sips/bites ?Postural Changes and/or Swallow Maneuvers: Seated upright 90 degrees  ?   ?    ?   ? ? ? ? Oral Care Recommendations: Oral care BID;Patient independent with oral care ?Follow Up Recommendations: No SLP follow up ?Assistance recommended at discharge: None ?SLP Visit Diagnosis: Dysphagia, oral phase (R13.11) ?Plan: All goals met;Discharge SLP treatment due to (comment) ? ? ? ? ?  ?  ? ?Sonia Baller, MA, CCC-SLP ?Speech Therapy ? ?

## 2022-04-29 NOTE — Progress Notes (Signed)
Physical Therapy Treatment ?Patient Details ?Name: [name] ?MRN: [id number] ?DOB: 1956-09-28 ?Today's Date: 04/29/2022 ? ? ?History of Present Illness Pt is a 66yo female presenting  to [facility] ED on 04/24/22 from sugery secondary to hypoxia while in pre-op for R ankle ORIF, satting mid-80s on RA.  PMH: COPD, myelomalacia, PTSD, panic disorder, depression, IBS. ? ?  ?PT Comments  ? ? 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  "I have a high bed at home". ?Assisted OOB to [facility] then amb.  General bed mobility comments: with increased time with physical assist to move RLE.  General transfer comment: 50% VC's on proper hand placement then 75% VC's on proper hand transfer and turn completion all while maintaining NWB R LE. General Gait Details: first assisted from elevated bed to [facility] 1/4 turn without walker at 75% VC's on proper hand placement/transfer/turn completion.  Second, assisted off BSC sit to stand onto walker as Therapist provided peri care.  Pt unable to self perform and maintain NWB.  Then amb a short distand to recliner approx 2 feet with 180 degree turn.  75% VC's on safety with turns and back gait. ?Pt would like a SMALL wc 16 inch, + cushion and a RW. ?Pt plans to D/C back home with Spouse.   ?  ?Recommendations for follow up therapy are one component of a multi-disciplinary discharge planning process, led by the attending physician.  Recommendations may be updated based on patient status, additional functional criteria and insurance authorization. ? ?Follow Up Recommendations ? Home health PT ?  ?  ?Assistance Recommended at Discharge Frequent or constant Supervision/Assistance  ?Patient can return home with the following A little help with walking and/or transfers;A little help with bathing/dressing/bathroom;Assistance with cooking/housework;Assist for transportation;Help with stairs or ramp for entrance;Direct supervision/assist for medications management ?  ?Equipment  Recommendations ? Rolling walker (2 wheels);Wheelchair (measurements PT);Wheelchair cushion (measurements PT)  ?  ?Recommendations for Other Services   ? ? ?  ?Precautions / Restrictions Precautions ?Precautions: Fall ?Restrictions ?Weight Bearing Restrictions: Yes ?RLE Weight Bearing: Non weight bearing  ?  ? ?Mobility ? Bed Mobility ?Overal bed mobility: Needs Assistance ?Bed Mobility: Supine to Sit ?  ?  ?Supine to sit: Min assist, HOB elevated ?  ?  ?General bed mobility comments: with increased time with physical assist to move RLE ?  ? ?Transfers ?Overall transfer level: Needs assistance ?Equipment used: Rolling walker (2 wheels), None ?Transfers: Bed to chair/wheelchair/BSC, Sit to/from Stand ?Sit to Stand: Min guard, Min assist ?Stand pivot transfers: Min guard, Min assist ?  ?  ?  ?  ?General transfer comment: 50% VC's on proper hand placement then 75% VC's on proper hand transfer and turn completion all while maintaining NWB R LE. ?  ? ?Ambulation/Gait ?Ambulation/Gait assistance: Min assist ?Gait Distance (Feet): 2 Feet ?Assistive device: Rolling walker (2 wheels) ?Gait Pattern/deviations: Step-through pattern, Decreased weight shift to right ?Gait velocity: decreased ?Gait velocity interpretation: <1.31 ft/sec, indicative of household ambulator ?  ?General Gait Details: first assisted from elevated bed to [facility] 1/4 turn without walker at 75% VC's on proper hand placement/transfer/turn completion.  Second, assisted off BSC sit to stand onto walker as Therapist provided peri care.  Pt unable to self perform and maintain NWB.  Then amb a short distand to recliner approx 2 feet with 180 degree turn.  75% VC's on safety with turns and back gait. ? ? ?Stairs ?  ?  ?  ?  ?  ? ? ?  Wheelchair Mobility ?  ? ?Modified Rankin (Stroke Patients Only) ?  ? ? ?  ?Balance   ?  ?  ?  ?  ?  ?  ?  ?  ?  ?  ?  ?  ?  ?  ?  ?  ?  ?  ?  ? ?  ?Cognition Arousal/Alertness: Awake/alert ?Behavior During Therapy: [facility] for tasks  assessed/performed ?Overall Cognitive Status: Within Functional Limits for tasks assessed ?  ?  ?  ?  ?  ?  ?  ?  ?  ?  ?  ?  ?  ?  ?  ?  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  "I have a high bed at home". ?  ?  ? ?  ?Exercises   ? ?  ?General Comments   ?  ?  ? ?Pertinent Vitals/Pain Pain Assessment ?Pain Assessment: Faces ?Faces Pain Scale: Hurts a little bit ?Pain Descriptors / Indicators: Grimacing, Discomfort ?Pain Intervention(s): Monitored during session, Premedicated before session, Repositioned  ? ? ?Home Living   ?  ?  ?  ?  ?  ?  ?  ?  ?  ?   ?  ?Prior Function    ?  ?  ?   ? ?PT Goals (current goals can now be found in the care plan section) Progress towards PT goals: Progressing toward goals ? ?  ?Frequency ? ? ? Min 5X/week ? ? ? ?  ?PT Plan Current plan remains appropriate  ? ? ?Co-evaluation   ?  ?  ?  ?  ? ?  ?AM-PAC PT "6 Clicks" Mobility   ?Outcome Measure ? Help needed turning from your back to your side while in a flat bed without using bedrails?: A Little ?Help needed moving from lying on your back to sitting on the side of a flat bed without using bedrails?: A Little ?Help needed moving to and from a bed to a chair (including a wheelchair)?: A Little ?Help needed standing up from a chair using your arms (e.g., wheelchair or bedside chair)?: A Little ?Help needed to walk in hospital room?: A Little ?Help needed climbing 3-5 steps with a railing? : A Lot ?6 Click Score: 17 ? ?  ?End of Session Equipment Utilized During Treatment: Gait belt ?Activity Tolerance: Patient tolerated treatment well ?Patient left: with call bell/phone within reach;in chair;with chair alarm set ?Nurse Communication: Mobility status ?PT Visit Diagnosis: Muscle weakness (generalized) (M62.81);Difficulty in walking, not elsewhere classified (R26.2);Other abnormalities of gait and mobility (R26.89);Pain ?Pain - Right/Left: Right ?Pain - part of body: Ankle and joints of foot ?   ? ? ?Time: 5638-7564 ?PT Time Calculation (min) (ACUTE ONLY): 23 min ? ?Charges:  $Gait Training: 8-22 mins ?$Therapeutic Activity: 8-22 mins          ?          ? ?Felecia Shelling  PTA ?Acute  Rehabilitation Services ?Pager      [phone] ?Office      [phone] ? ? ?

## 2022-04-29 NOTE — Progress Notes (Addendum)
[facility] ? ?PROGRESS NOTE ? ?[name] RJJ:[id number] DOB: Sep 17, 1956 DOA: 04/24/2022 ?PCP: Smith Robert, MD ? ? ?Brief HPI:   ?66 year old female with history of COPD, not on oxygen at home, myelomalacia presented with hypoxia.  Patient has history of COPD, not on home oxygen, recently had ankle injury ? and was supposed to undergo operative intervention by orthopedics, but during preop evaluation was found to be hypoxemic with O2 sats in mid 80s on room air.  She was sent to ED for further evaluation. ?In the ED CTA chest was done which was negative for PE. ? ? ?Subjective  ? ?Patient seen and examined, denies any complaints.  Pain well controlled. ? ? Assessment/Plan:  ? ? 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 ??  COPD/emphysema ?-CT chest shows emphysema ?-Not in exacerbation at this time ? ?S/p ORIF for right bimalleolar ankle fracture  ?-We will switch from Vicodin to Percocet ?-Continue IV Dilaudid as needed for pain ?-We will discharge home with home health PT in a.m. ? ? ?Preop eval ?-Patient able to walk a flight of stairs or 100 feet ?-EKG showed no acute ischemia ?-Patient is moderate risk for surgery ? ?Anxiety ?-Restart clonazepam 0.5 mg p.o. twice daily as needed ?-Changed to 0.5 mg p.o. 3 times daily as needed as per patient request ? ?Hypokalemia ?-Potassium is 3.4 ?-We will give K-Dur 40 mEq p.o. x1 ?-Check serum potassium in a.m. ? ?Medications ? ?  ? baclofen  10 mg Oral TID  ? enoxaparin (LOVENOX) injection  30 mg Subcutaneous Q24H  ? feeding supplement  237 mL Oral TID BM  ? gabapentin  600 mg Oral Q lunch  ? gabapentin  900 mg Oral BID  ? mometasone-formoterol  2 puff Inhalation BID  ? multivitamin with minerals  1 tablet Oral Daily  ? mupirocin ointment  1 application. Nasal BID  ? nicotine  21 mg Transdermal Daily  ? umeclidinium bromide  1 puff Inhalation Daily  ? ? ?  Data Reviewed:  ? ?CBG: ? ?No results for input(s): GLUCAP in the last 168 hours. ? ?SpO2: 94 % ?O2 Flow Rate (L/min): 2 L/min ?FiO2 (%): 32 %  ? ? ?Vitals:  ? 04/28/22 2116 04/29/22 0515 04/29/22 0903 04/29/22 1426  ?BP: (!) 145/82 (!) 156/83  133/83  ?Pulse: 89 78  100  ?Resp: 18 18  19   ?Temp: 98.4 ?F (36.9 ?C) 98 ?F (36.7 ?C)  98.1 ?F (36.7 ?C)  ?TempSrc: Oral Oral  Oral  ?SpO2: 97% 95% 96% 94%  ?Weight:      ? ? ? ? ?Data Reviewed: ? ?Basic Metabolic Panel: ?Recent Labs  ?Lab 04/24/22 ?1609 04/25/22 ?06/25/22 04/28/22 ?04/30/22 04/29/22 ?0455  ?NA 144 140 142 140  ?K 3.9 3.8 3.1* 3.4*  ?CL 111 106 104 101  ?CO2 28 28 28 28   ?GLUCOSE 87 91 95 99  ?BUN 15 12 8 11   ?CREATININE 0.51 0.54 0.42* 0.47  ?CALCIUM 8.4* 9.0 9.3 9.4  ? ? ?CBC: ?Recent Labs  ?Lab 04/24/22 ?1609 04/25/22 ? 04/28/22 ?06/25/22  ?WBC 9.1 6.9 10.1  ?NEUTROABS 5.9  --   --   ?HGB 14.9 14.0 15.2*  ?HCT 45.2 44.3 46.1*  ?MCV 102.0* 102.8* 98.5  ?PLT 243 218 220  ? ? ?LFT ?Recent Labs  ?Lab 04/25/22 ?0540  ?AST 18  ?ALT 12  ?ALKPHOS 66  ?BILITOT 0.9  ?PROT  6.2*  ?ALBUMIN 3.3*  ? ?  ?Antibiotics: ?Anti-infectives (From admission, onward)  ? ? Start     Dose/Rate Route Frequency Ordered Stop  ? 04/27/22 0400  ciprofloxacin (CIPRO) IVPB 200 mg       ? 200 mg ?100 mL/hr over 60 Minutes Intravenous Every 12 hours 04/26/22 2026 04/27/22 1825  ? 04/26/22 1910  vancomycin (VANCOCIN) powder  Status:  Discontinued       ?   As needed 04/26/22 1929 04/26/22 2128  ? 04/26/22 0600  vancomycin (VANCOCIN) IVPB 1000 mg/200 mL premix       ? 1,000 mg ?200 mL/hr over 60 Minutes Intravenous On call to O.R. 04/26/22 0104 04/26/22 1659  ? 04/26/22 0600  ciprofloxacin (CIPRO) IVPB 400 mg       ? 400 mg ?200 mL/hr over 60 Minutes Intravenous On call to O.R. 04/26/22 0104 04/26/22 1835  ? ?  ? ? ? ?DVT prophylaxis: Lovenox ? ?Code Status: Full code ? ?Family Communication: Discussed with husband at bedside ? ? ?CONSULTS  ? ? ?Objective  ? ? ?Physical  Examination: ? ?General-appears in no acute distress ?Heart-S1-S2, regular, no murmur auscultated ?Lungs-clear to auscultation bilaterally, no wheezing or crackles auscultated ?Abdomen-soft, nontender, no organomegaly ?Extremities-right lower extremity in splint ?Neuro-alert, oriented x3, no focal deficit noted ? ? ?Status is: Inpatient: Acute hypoxemic respiratory failure ? ? ? ?  ? ? ?Meredeth Ide ?  ?[facility] ?If 7PM-7AM, please contact night-coverage at [email or web address], ?Office  [phone] ? ? ?04/29/2022, 2:39 PM  LOS: 3 days  ? ? ? ? ? ? ? ? ? ? ?  ?

## 2022-04-29 NOTE — Care Management Important Message (Signed)
Important Message ? ?Patient Details IM Letter given to the Patient. ?Name: [name] ?MRN: [id number] ?Date of Birth: 06-Apr-1956 ? ? ?Medicare Important Message Given:  Yes ? ? ? ? ?Caren Macadam ?04/29/2022, 11:36 AM ?

## 2022-04-29 NOTE — Plan of Care (Signed)

## 2022-04-30 DIAGNOSIS — R0902 Hypoxemia: Secondary | ICD-10-CM | POA: Diagnosis not present

## 2022-04-30 DIAGNOSIS — J9601 Acute respiratory failure with hypoxia: Secondary | ICD-10-CM | POA: Diagnosis not present

## 2022-04-30 DIAGNOSIS — S82891G Other fracture of right lower leg, subsequent encounter for closed fracture with delayed healing: Secondary | ICD-10-CM | POA: Diagnosis not present

## 2022-04-30 DIAGNOSIS — J439 Emphysema, unspecified: Secondary | ICD-10-CM | POA: Diagnosis not present

## 2022-04-30 LAB — POTASSIUM: Potassium: 3.9 mmol/L (ref 3.5–5.1)

## 2022-04-30 NOTE — TOC Transition Note (Signed)
Transition of Care (TOC) - CM/SW Discharge Note ? ? ?Patient Details  ?Name: [name] ?MRN: [id number] ?Date of Birth: [birth year] ? ?Transition of Care (TOC) CM/SW Contact:  ?Darleene Cleaver, LCSW ?Phone Number: ?04/30/2022, 10:48 AM ? ? ?Clinical Narrative:    ? ?CSW spoke to patient and asked her about DME.  Patient has orders for a youth walker, bedside commode, and a wheelchair.  CSW informed her that insurance will not pay for a walker and a wheelchair, patient stated she would rather use insurance for the walker, and she will look for a wheelchair on her own.  CSW asked patient if she would like home health and patient stated that she does not feel that she needs it.  Patient stated she does not want home health.  CSW spoke to [place] at [facility] in regards to equipment needs, and she will deliver to the room before patient discharges today.  Patient is currently being followed by outpatient palliative through [facility].  CSW signing off, please reconsult if other TOC needs arise.  ? ? ?Final next level of care: Home/Self Care ?Barriers to Discharge: Barriers Resolved ? ? ?Patient Goals and CMS Choice ?Patient states their goals for this hospitalization and ongoing recovery are:: To return back home. ?CMS Medicare.gov Compare Post Acute Care list provided to:: Patient ?Choice offered to / list presented to : Patient ? ?Discharge Placement ?  ?           ?  ?  ?  ?  ? ?Discharge Plan and Services ?  ?  ?           ?DME Arranged: Walker youth, Bedside commode, Wheelchair manual ?DME Agency: [facility] ?Date DME Agency Contacted: 04/30/22 ?Time DME Agency Contacted: 1047 ?Representative spoke with at DME Agency: Duwayne Heck ?HH Arranged: Refused HH ?  ?  ?  ?  ? ?Social Determinants of Health (SDOH) Interventions ?  ? ? ?Readmission Risk Interventions ?   ? View : No data to display.  ?  ?  ?  ? ? ? ? ? ?

## 2022-04-30 NOTE — Discharge Summary (Addendum)
?Physician Discharge Summary ?  ?Patient: [name] MRN: [id number] DOB: Oct 05, 1956  ?Admit date:     04/24/2022  ?Discharge date: 04/30/22  ?Discharge Physician: Meredeth Ide  ? ?PCP: Smith Robert, MD  ? ?Recommendations at discharge:  ? ?Follow-up orthopedic surgery in 1 week ?Continue taking Xarelto 10 mg daily for 2 weeks for DVT prophylaxis ?Follow-up [facility] pulmonary as outpatient ? ?Discharge Diagnoses: ?Principal Problem: ?  Acute respiratory failure with hypoxia (HCC) ?Active Problems: ?  COPD with emphysema (HCC) ?  Tobacco abuse ?  Closed right ankle fracture ?  Protein-calorie malnutrition, severe ?  Hypoxia ? ?Resolved Problems: ?  * No resolved hospital problems. * ? ?Hospital Course: ? ?66 year old female with history of COPD, not on oxygen at home, myelomalacia presented with hypoxia.  Patient has history of COPD, not on home oxygen, recently had ankle injury ? and was supposed to undergo operative intervention by orthopedics, but during preop evaluation was found to be hypoxemic with O2 sats in mid 80s on room air.  She was sent to ED for further evaluation. ?In the ED CTA chest was done which was negative for PE. ? ?Assessment and Plan: ? 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  ??  COPD/emphysema ?-CT chest shows emphysema ?-Not in exacerbation at this time ?  ?S/p ORIF for right bimalleolar ankle fracture  ?-We will discharge with home health PT and OT ?-Patient has been prescribed Xarelto 10 mg daily for 2 weeks by her orthopedic surgeon for DVT prophylaxis ?  ? ? Anxiety ?-Continue clonazepam ?  ?Hypokalemia ?-Replete ? ? ?  ? ? ?Consultants: Orthopedics ?Procedures performed: ORIF right ankle fracture ?Disposition: Home ?Diet recommendation:  ?Discharge Diet Orders (From admission, onward)  ? ?  Start     Ordered  ? 04/30/22 0000  Diet - low sodium heart healthy       ? 04/30/22 1425  ? ?   ?  ? ?  ? ?Regular diet ?DISCHARGE MEDICATION: ?Allergies as of 04/30/2022   ? ?   Reactions  ? Amoxicillin-pot Clavulanate Other (See Comments)  ? GI upset  ? Naltrexone Other (See Comments)  ? Hallucinations, sleepiness, withdrawal s/s, poor apepitie  ? Nsaids   ? Advised not to take   ? Toradol [ketorolac Tromethamine] Nausea And Vomiting, Swelling  ? TONGUE SWELLING  ? Tramadol Nausea And Vomiting  ? Atorvastatin Other (See Comments), Itching, Nausea Only, Tinitus  ? Benadryl [diphenhydramine Hcl] Palpitations  ? Insomnia  ? Ceftin [cefuroxime Axetil] Rash  ? Compazine Palpitations  ? Insomnia  ? Epinephrine Palpitations  ? Insomina  ? ?  ? ?  ?Medication List  ?  ? ?STOP taking these medications   ? ?amoxicillin 500 MG capsule ?Commonly known as: AMOXIL ?  ?aspirin EC 81 MG tablet ?  ?HYDROcodone-acetaminophen 5-325 MG tablet ?Commonly known as: NORCO/VICODIN ?  ?HYDROcodone-acetaminophen 7.5-325 MG tablet ?Commonly known as: NORCO ?  ?oxyCODONE-acetaminophen 5-325 MG tablet ?Commonly known as: PERCOCET/ROXICET ?  ?traMADol 50 MG tablet ?Commonly known as: ULTRAM ?  ? ?  ? ?TAKE these medications   ? ?acetaminophen 500 MG tablet ?Commonly known as: TYLENOL ?Take 1,000 mg by mouth 4 (four) times daily as needed (pain). ?  ?albuterol 108 (90 Base) MCG/ACT inhaler ?Commonly known as: VENTOLIN HFA ?Inhale 2 puffs into the lungs daily as needed for wheezing or shortness of breath. ?What changed:  Another medication with the same name was changed. Make sure you understand how and when to take each. ?  ?albuterol (2.5 MG/3ML) 0.083% nebulizer solution ?Commonly known as: PROVENTIL ?Take 3 mLs (2.5 mg total) by nebulization every 4 (four) hours as needed for wheezing or shortness of breath. ?What changed: when to take this ?  ?Azelastine HCl 137 MCG/SPRAY Soln ?Place 1 spray into the nose daily as needed (for nasal congestion). ?  ?baclofen 10 MG tablet ?Commonly known as: LIORESAL ?Take 0.5 tablets (5 mg total) by  mouth 2 (two) times daily. ?What changed:  ?how much to take ?when to take this ?  ?budesonide-formoterol 160-4.5 MCG/ACT inhaler ?Commonly known as: SYMBICORT ?Inhale 2 puffs into the lungs 2 (two) times daily. ?  ?carboxymethylcellulose 0.5 % Soln ?Commonly known as: REFRESH PLUS ?Place 2-4 drops into both eyes See admin instructions. 2-4 drops into both eyes three times a day as needed for dry eye ?  ?chlorhexidine 0.12 % solution ?Commonly known as: PERIDEX ?Use as directed 15 mLs in the mouth or throat daily as needed (mouth discomfort). ?  ?clonazePAM 1 MG tablet ?Commonly known as: KLONOPIN ?Take 0.5 tablets (0.5 mg total) by mouth 2 (two) times daily as needed for anxiety. *May take one additional tablet as needed for anxiety/PTSD ?What changed:  ?how much to take ?when to take this ?additional instructions ?  ?fluticasone 50 MCG/ACT nasal spray ?Commonly known as: FLONASE ?Place 2 sprays into both nostrils 2 (two) times daily as needed for allergies. ?  ?gabapentin 300 MG capsule ?Commonly known as: NEURONTIN ?Take 900 mg by mouth See admin instructions. 900 mg twice a day and 600 mg once daily. ?  ?ipratropium 0.03 % nasal spray ?Commonly known as: ATROVENT ?Place 1 spray into both nostrils daily as needed for rhinitis. ?  ?loperamide 2 MG capsule ?Commonly known as: IMODIUM ?Take 2 mg by mouth 4 (four) times daily as needed for diarrhea or loose stools. ?  ?Muro 128 5 % ophthalmic ointment ?Generic drug: sodium chloride ?Place 1 application. into both eyes at bedtime. ?  ?neomycin-polymyxin-dexamethasone 0.1 % ophthalmic suspension ?Commonly known as: MAXITROL ?Place 1 drop into the right eye 4 (four) times daily. ?  ?ondansetron 8 MG tablet ?Commonly known as: ZOFRAN ?Take by mouth. ?What changed: Another medication with the same name was removed. Continue taking this medication, and follow the directions you see here. ?  ?oxyCODONE 5 MG immediate release tablet ?Commonly known as: Oxy  IR/ROXICODONE ?Take by mouth. ?  ?pyridOXINE 50 MG tablet ?Commonly known as: VITAMIN B-6 ?Take 50 mg by mouth daily. ?  ?Vitamin D3 125 MCG (5000 UT) Caps ?Take 1 capsule by mouth in the morning and at bedtime. ?What changed: Another medication with the same name was removed. Continue taking this medication, and follow the directions you see here. ?  ?Xarelto 10 MG Tabs tablet ?Generic drug: rivaroxaban ?Take 10 mg by mouth daily. ?  ? ?  ? ?  ?  ? ? ?  ?Durable Medical Equipment  ?(From admission, onward)  ?  ? ? ?  ? ?  Start     Ordered  ? 04/30/22 0943  For home use only DME Walker youth  Once       ?Question:  Patient needs a walker to treat with the following condition  Answer:  Ankle fracture  ? 04/30/22 0942  ? 04/30/22 0943  For home use only DME standard manual wheelchair with seat cushion  Once       ?  Comments: Patient suffers from ankle fracture which impairs their ability to perform daily activities like bathing in the home.  A cane will not resolve issue with performing activities of daily living. A wheelchair will allow patient to safely perform daily activities. Patient can safely propel the wheelchair in the home or has a caregiver who can provide assistance. Length of need 6 months . ?Accessories: elevating leg rests (ELRs), wheel locks, extensions and anti-tippers.  ? 04/30/22 0943  ? 04/30/22 0942  For home use only DME Bedside commode  Once       ?Question:  Patient needs a bedside commode to treat with the following condition  Answer:  Ankle fracture  ? 04/30/22 0942  ? ?  ?  ? ?  ? ? ?  ?Discharge Care Instructions  ?(From admission, onward)  ?  ? ? ?  ? ?  Start     Ordered  ? 04/30/22 0000  No dressing needed       ? 04/30/22 1425  ? ?  ?  ? ?  ? ? Follow-up Information   ? ? [name], MD Follow up in 1 week(s).   ?Specialty: Orthopedic Surgery ?Contact information: ?[address] ?[place] [address] ?[phone] ? ? ?  ?  ? ?  ?  ? ?  ? ?Discharge Exam: ?Filed  Weights  ? 04/24/22 1217  ?Weight: 40.8 kg  ? ?General-appears in no acute distress ?Heart-S1-S2, regular, no murmur auscultated ?Lungs-clear to auscultation bilaterally, no wheezing or crackles auscultated ?Abdomen-soft,

## 2022-04-30 NOTE — Progress Notes (Signed)
Went over discharge instructions w/ pt and pt husband Max. Pt verbalized understanding.  ?

## 2022-04-30 NOTE — Plan of Care (Signed)

## 2022-05-07 ENCOUNTER — Other Ambulatory Visit: Payer: Medicare Other | Admitting: Primary Care

## 2022-05-07 DIAGNOSIS — G9589 Other specified diseases of spinal cord: Secondary | ICD-10-CM

## 2022-05-07 DIAGNOSIS — F4312 Post-traumatic stress disorder, chronic: Secondary | ICD-10-CM

## 2022-05-07 DIAGNOSIS — G373 Acute transverse myelitis in demyelinating disease of central nervous system: Secondary | ICD-10-CM

## 2022-05-07 DIAGNOSIS — Z515 Encounter for palliative care: Secondary | ICD-10-CM

## 2022-05-07 NOTE — Progress Notes (Signed)
[facility] Consult Note Telephone: [phone]  Fax: [phone]    Date of encounter: 05/07/22 3:38 PM PATIENT NAME: [name] [address] [place] [address]   [phone] (home)  DOB: 03/24/1956 MRN: [id number] PRIMARY CARE PROVIDER:    Vidal Schwalbe, MD,  [address] [place] [address] [phone]  REFERRING PROVIDER:   Vidal Schwalbe, MD [address] [place],  [place] [address] [phone]  RESPONSIBLE PARTY:    Contact Information     Name Relation Home Work Mobile   [name],[name] Spouse [phone]  [phone]       Due to the COVID-19 crisis, this visit was done via telemedicine from my office and it was initiated and consent by this patient and or family.  I connected with  [name] OR PROXY on 05/07/22 by a telemedicine application and verified that I am speaking with the correct person using two identifiers.   I discussed the limitations of evaluation and management by telemedicine. The patient expressed understanding and agreed to proceed.  Palliative Care was asked to follow this patient by consultation request of  Breanna Schwalbe, MD to address advance care planning and complex medical decision making. This is a follow up visit.                                   ASSESSMENT AND PLAN / RECOMMENDATIONS:   Advance Care Planning/Goals of Care: Goals include to maximize quality of life and symptom management. Patient/health care surrogate gave his/her permission to discuss.Our advance care planning conversation included a discussion about:    The value and importance of advance care planning  Experiences with loved ones who have been seriously ill or have died  Exploration of personal, cultural or spiritual beliefs that might influence medical decisions  Exploration of goals of care in the event of a sudden injury or illness  Discussed difficulty in health decisions and getting access. CODE  STATUS: FULL   Symptom Management/Plan:  Patient called with background of having a fractured foot. She has orders for no weight bearing, and states no home health is involved.   She endorses pain and needing to follow up with pcp for pain relief. I checked PDMP and she appears to have gotten recent prescriptions.  She endorses needing a navigator. She has been in touch with her [place] case managers for help with getting a wheel chair . She also needs a recliner that goes all the way back to allow her to sleep.  I have asked our RN to call PCP office to generate home health referral for SN, SW, PT, OT, HHA and equipment.Pt has voiced a fear of outsiders coming to her home but will try to accept the help. She would qualify for Chore too if she'll allow it.    Follow up Palliative Care Visit: Palliative care will continue to follow for complex medical decision making, advance care planning, and clarification of goals. Return 2-4 weeks or prn.  I spent 30 minutes providing this consultation. More than 50% of the time in this consultation was spent in counseling and care coordination.   PPS: 30%  HOSPICE ELIGIBILITY/DIAGNOSIS: TBD  Chief Complaint: pain, immobility  HISTORY OF PRESENT ILLNESS:  [name] is a 66 y.o. year old female  with transverse myelitis, falls, LE fracture, pain . Patient seen today to review palliative  care needs to include medical decision making and advance care planning as appropriate.   History obtained from review of EMR, discussion with primary team, and interview with family, facility staff/caregiver and/or [name].  I reviewed available labs, medications, imaging, studies and related documents from the EMR.  Records reviewed and summarized above.   ROS   General: NAD ENMT: denies dysphagia, recent dental infections Cardiovascular: denies chest pain, denies DOE Pulmonary: denies cough, denies increased SOB Abdomen: endorses fair  appetite, denies  constipation, endorses incontinence of bowel GU: denies dysuria, endorses incontinence of urine MSK:  endorse   increased weakness,  + falls reported Skin: denies rashes or wounds Neurological: endorses chronic pain, denies insomnia Psych: Endorses depressed mood  Physical Exam: deferred  Thank you for the opportunity to participate in the care of Ms. Funkhouser.  The palliative care team will continue to follow. Please call our office at [phone] if we can be of additional assistance.   Jason Coop, NP DNP, AGPCNP-BC

## 2022-05-15 ENCOUNTER — Other Ambulatory Visit: Payer: Medicare Other | Admitting: Primary Care

## 2022-05-15 ENCOUNTER — Telehealth: Payer: Self-pay

## 2022-05-15 DIAGNOSIS — S82891G Other fracture of right lower leg, subsequent encounter for closed fracture with delayed healing: Secondary | ICD-10-CM

## 2022-05-15 DIAGNOSIS — Z515 Encounter for palliative care: Secondary | ICD-10-CM

## 2022-05-15 DIAGNOSIS — F419 Anxiety disorder, unspecified: Secondary | ICD-10-CM

## 2022-05-15 DIAGNOSIS — G9589 Other specified diseases of spinal cord: Secondary | ICD-10-CM

## 2022-05-15 DIAGNOSIS — G8929 Other chronic pain: Secondary | ICD-10-CM

## 2022-05-15 NOTE — Telephone Encounter (Signed)
1245 pm.  Phone call made to [facility] with PCP office to follow up on [facility] referrals.  Referral has been sent to 4-5 agencies but no one has accepted.   Ralene Bathe, NP updated.

## 2022-05-15 NOTE — Progress Notes (Signed)
[facility] Consult Note Telephone: [phone]  Fax: [phone]    Date of encounter: 05/15/22 12:30 PM PATIENT NAME: Rheanne Cortopassi [address] [place] [address]   [phone] (home)  DOB: 1956-07-05 MRN: [id number] PRIMARY CARE PROVIDER:    Vidal Schwalbe, MD,  [address] [place] [address] [phone]  REFERRING PROVIDER:   Vidal Schwalbe, MD [address] [place],  [place] [address] [phone]  RESPONSIBLE PARTY:    Contact Information     Name Relation Home Work Mobile   Meschke,Max Raechel [place] [phone]  [phone]       I met face to face with patient and family in  home. Palliative Care was asked to follow this patient by consultation request of  Vidal Schwalbe, MD to address advance care planning and complex medical decision making. This is a follow up visit.                                   ASSESSMENT AND PLAN / RECOMMENDATIONS:   Advance Care Planning/Goals of Care: Goals include to maximize quality of life and symptom management. Patient/health care surrogate gave his/her permission to discuss.Our advance care planning conversation included a discussion about:    Exploration of personal, cultural or spiritual beliefs that might influence medical decisions  Exploration of goals of care in the event of a sudden injury or illness  Identification of a healthcare agent - friend is POA Review of an  advance directive document . CODE STATUS: DNR  Symptom Management/Plan:  Pain: Chronic pain, PDMP checked, has gotten multiple prescriptions in April and May. I am not prescribing controlled meds but will leave to current team.  Suggested OTC analgesic. Endorses numbness and pain  in R LE but Neuro checks of leg are WNL, warm and good circulation. She has a splint (not cast) on R LE. She states she is not willing to take psych medications but will speak with psychologist. I would recommend her re  establishing with pain psychologist. Please refer to [facility] as patient would like to change, to Dr. Gillis Santa.  Distance to [facility] is excessive and transportation is a barrier.  Transport:  Has allowance to get rides to MD offices but has problem with distances being too far.  Cannot use van due to discomfort. Has new w/c but does not elevate  LE.  This service is thru [place].   Dental services: Need dental services will refer to [facility], geriatrics, both she and her husband Max Langhorst 06/01/1951.  This would be for focused dental treatment, not ongoing care.   Home health: Needs help with adls, PT and OT assessment.  Is nwb on R leg but has transfer and safety issues.  WE have called to 5 agencies none will take. [facility] may be able to take later, due to staffing.  Personal care services/chore: Has been getting calls from community  health care. She has not been returning calls. I encouraged her  to return the call as she qualifies for and needs [facility] chore services. She states she will reach back out.   Follow up Palliative Care Visit: Palliative care will continue to follow for complex medical decision making, advance care planning, and clarification of goals. Return 8 weeks or prn.  I spent 60 minutes providing this consultation. More than 50% of the time in this consultation  was spent in counseling and care coordination.  PPS: 40%  HOSPICE ELIGIBILITY/DIAGNOSIS: not at this time  Chief Complaint: debility, immobility,   HISTORY OF PRESENT ILLNESS:  GLENDI MOHIUDDIN is a 66 y.o. year old female  with transmyelitis, chronic pain, opioid sue, immobility . Patient seen today to review palliative care needs to include medical decision making and advance care planning as appropriate.   History obtained from review of EMR, discussion with primary team, and interview with family, facility staff/caregiver and/or Ms. Higinbotham.  I reviewed available labs, medications, imaging,  studies and related documents from the EMR.  Records reviewed and summarized above.   ROS   General: NAD EYES: denies vision changes ENMT: denies dysphagia Cardiovascular: denies chest pain, denies DOE Pulmonary: denies cough, denies increased SOB Abdomen: endorses fair appetite, denies constipation, endorses continence of bowel GU: denies dysuria, endorses continence of urine MSK:   endorses increased weakness,  no falls reported Skin: denies rashes or wounds Neurological: endorses chronic pain, denies insomnia Psych: Endorses depressed mood  Physical Exam: Current and past weights: 90 lbs Constitutional: NAD General: frail appearing, thin EYES: anicteric sclera, lids intact, no discharge  ENMT: intact hearing, oral mucous membranes moist, dentition I missing CV: S1S2, RRR, no LE edema Pulmonary: LCTA, no increased work of breathing, no cough, room air, 91% PO2. Abdomen: intake 70%, normo-active BS + 4 quadrants, soft and non tender, no ascites MSK: + sarcopenia, moves all extremities, non ambulatory Skin: warm and dry, no rashes or wounds on visible skin Neuro: +  generalized weakness,  no cognitive impairment, anxious affect   Thank you for the opportunity to participate in the care of Ms. Spang.  The palliative care team will continue to follow. Please call our office at [phone] if we can be of additional assistance.   Jason Coop, NP DNP, AGPCNP-BC  COVID-19 PATIENT SCREENING TOOL Asked and negative response unless otherwise noted:   Have you had symptoms of covid, tested positive or been in contact with someone with symptoms/positive test in the past 5-10 days?

## 2022-05-15 NOTE — Telephone Encounter (Signed)
Phone call made to [facility] to find [facility] agencies accepted by this insurance.  Spoke with Erskine Squibb who provided [employer], [facility] and [facility].  I contacted [facility] and [facility] and they both advised they do not accept patient's insurance.  Phone call made to [facility] and they do not have staffing availability at this time.   Clearnce Sorrel, NP updated on above.

## 2022-05-18 ENCOUNTER — Emergency Department (HOSPITAL_COMMUNITY): Payer: Medicare Other

## 2022-05-18 ENCOUNTER — Emergency Department (HOSPITAL_COMMUNITY)
Admission: EM | Admit: 2022-05-18 | Discharge: 2022-05-18 | Disposition: A | Discharge status: Home / Self Care | Payer: Medicare Other | Attending: Emergency Medicine | Admitting: Emergency Medicine

## 2022-05-18 ENCOUNTER — Encounter (HOSPITAL_COMMUNITY): Payer: Self-pay

## 2022-05-18 ENCOUNTER — Other Ambulatory Visit: Payer: Self-pay

## 2022-05-18 DIAGNOSIS — W19XXXA Unspecified fall, initial encounter: Secondary | ICD-10-CM

## 2022-05-18 DIAGNOSIS — M25562 Pain in left knee: Secondary | ICD-10-CM | POA: Diagnosis not present

## 2022-05-18 DIAGNOSIS — Z7951 Long term (current) use of inhaled steroids: Secondary | ICD-10-CM | POA: Insufficient documentation

## 2022-05-18 DIAGNOSIS — S51811A Laceration without foreign body of right forearm, initial encounter: Secondary | ICD-10-CM | POA: Diagnosis not present

## 2022-05-18 DIAGNOSIS — S161XXA Strain of muscle, fascia and tendon at neck level, initial encounter: Secondary | ICD-10-CM

## 2022-05-18 DIAGNOSIS — S80212A Abrasion, left knee, initial encounter: Secondary | ICD-10-CM | POA: Diagnosis not present

## 2022-05-18 DIAGNOSIS — S0990XA Unspecified injury of head, initial encounter: Secondary | ICD-10-CM

## 2022-05-18 DIAGNOSIS — W050XXA Fall from non-moving wheelchair, initial encounter: Secondary | ICD-10-CM | POA: Insufficient documentation

## 2022-05-18 DIAGNOSIS — S59911A Unspecified injury of right forearm, initial encounter: Secondary | ICD-10-CM | POA: Diagnosis present

## 2022-05-18 DIAGNOSIS — J449 Chronic obstructive pulmonary disease, unspecified: Secondary | ICD-10-CM | POA: Insufficient documentation

## 2022-05-18 DIAGNOSIS — M25551 Pain in right hip: Secondary | ICD-10-CM | POA: Diagnosis not present

## 2022-05-18 DIAGNOSIS — M25561 Pain in right knee: Secondary | ICD-10-CM

## 2022-05-18 MED ORDER — OXYCODONE HCL 5 MG PO TABS
5.0000 mg | ORAL_TABLET | Freq: Once | ORAL | Status: AC
  Administered 2022-05-18: 5 mg via ORAL
  Filled 2022-05-18: qty 1

## 2022-05-18 MED ORDER — OXYCODONE HCL 5 MG PO TABS
5.0000 mg | ORAL_TABLET | Freq: Four times a day (QID) | ORAL | 0 refills | Status: DC | PRN
Start: 2022-05-18 — End: 2024-06-09

## 2022-05-18 NOTE — ED Provider Notes (Signed)
[facility] EMERGENCY DEPARTMENT Provider Note   CSN: [id number] Arrival date & time: 05/18/22  1728     History  Chief Complaint  Patient presents with   Lytle Michaels    [name] is a 66 y.o. female.  HPI Patient is a 66 year old female with a history of COPD, myelomalacia, transverse myelitis, who presents to the emergency department due to a fall that occurred earlier today.  Patient states that she had a recent ORIF done by Dr. Kathaleen Bury on the right ankle.  She was getting out of her wheelchair in her bathroom and used a metal drawer to stand up.  The drawer fell out and she then fell to the floor.  She states that she landed on a linoleum surface.  She is unsure of head trauma.  She complains of "pain all over".  Denies LOC.    Home Medications Prior to Admission medications   Medication Sig Start Date End Date Taking? Authorizing Provider  oxyCODONE (ROXICODONE) 5 MG immediate release tablet Take 1 tablet (5 mg total) by mouth every 6 (six) hours as needed for severe pain. 05/18/22  Yes Rayna Sexton, PA-C  acetaminophen (TYLENOL) 500 MG tablet Take 1,000 mg by mouth 4 (four) times daily as needed (pain).    [provider]  albuterol (PROVENTIL HFA;VENTOLIN HFA) 108 (90 Base) MCG/ACT inhaler Inhale 2 puffs into the lungs daily as needed for wheezing or shortness of breath.    [provider]  albuterol (PROVENTIL) (2.5 MG/3ML) 0.083% nebulizer solution Take 3 mLs (2.5 mg total) by nebulization every 4 (four) hours as needed for wheezing or shortness of breath. Patient taking differently: Take 2.5 mg by nebulization daily as needed for wheezing or shortness of breath. 07/08/19   Denton Brick, Courage, MD  Azelastine HCl 137 MCG/SPRAY SOLN Place 1 spray into the nose daily as needed (for nasal congestion).  10/27/18   [provider]  baclofen (LIORESAL) 10 MG tablet Take 0.5 tablets (5 mg total) by mouth 2 (two) times daily. Patient taking differently: Take  10 mg by mouth 3 (three) times daily. 07/08/19   Roxan Hockey, MD  budesonide-formoterol (SYMBICORT) 160-4.5 MCG/ACT inhaler Inhale 2 puffs into the lungs 2 (two) times daily.    [provider]  carboxymethylcellulose (REFRESH PLUS) 0.5 % SOLN Place 2-4 drops into both eyes See admin instructions. 2-4 drops into both eyes three times a day as needed for dry eye    [provider]  chlorhexidine (PERIDEX) 0.12 % solution Use as directed 15 mLs in the mouth or throat daily as needed (mouth discomfort). 03/29/22   [provider]  Cholecalciferol (VITAMIN D3) 125 MCG (5000 UT) CAPS Take 1 capsule by mouth in the morning and at bedtime.    [provider]  clonazePAM (KLONOPIN) 1 MG tablet Take 0.5 tablets (0.5 mg total) by mouth 2 (two) times daily as needed for anxiety. *May take one additional tablet as needed for anxiety/PTSD Patient taking differently: Take 1 mg by mouth in the morning, at noon, and at bedtime. 07/08/19   Roxan Hockey, MD  fluticasone (FLONASE) 50 MCG/ACT nasal spray Place 2 sprays into both nostrils 2 (two) times daily as needed for allergies.    [provider]  gabapentin (NEURONTIN) 300 MG capsule Take 900 mg by mouth See admin instructions. 900 mg twice a day and 600 mg once daily.    [provider]  ipratropium (ATROVENT) 0.03 % nasal spray Place 1 spray into both nostrils  daily as needed for rhinitis.  Patient not taking: Reported on 10/15/2021 10/12/18   [provider]  loperamide (IMODIUM) 2 MG capsule Take 2 mg by mouth 4 (four) times daily as needed for diarrhea or loose stools.    [provider]  MURO 128 5 % ophthalmic ointment Place 1 application. into both eyes at bedtime. 11/10/18   [provider]  neomycin-polymyxin-dexamethasone (MAXITROL) 0.1 % ophthalmic suspension Place 1 drop into the right eye 4 (four) times daily. Patient not taking: Reported on 04/24/2022    [provider]  ondansetron (ZOFRAN) 8 MG tablet Take by mouth. Patient not taking: Reported on 04/24/2022 04/24/22   [provider]  pyridOXINE (VITAMIN B-6) 50 MG tablet Take 50 mg by mouth daily. 09/28/21   [provider]  XARELTO 10 MG TABS tablet Take 10 mg by mouth daily. Patient not taking: Reported on 04/24/2022 04/24/22   [provider]      Allergies    Amoxicillin-pot clavulanate, Naltrexone, Nsaids, Toradol [ketorolac tromethamine], Tramadol, Atorvastatin, Benadryl [diphenhydramine hcl], Ceftin [cefuroxime axetil], Compazine, and Epinephrine    Review of Systems   Review of Systems  All other systems reviewed and are negative. Ten systems reviewed and are negative for acute change, except as noted in the HPI.   Physical Exam Updated Vital Signs BP 135/70   Pulse 100   Temp 98.1 F (36.7 C) (Oral)   Resp 20   Ht 5' (1.524 m)   Wt 40.8 kg   SpO2 (!) 89%   BMI 17.58 kg/m  Physical Exam Vitals and nursing note reviewed.  Constitutional:      General: She is not in acute distress.    Appearance: Normal appearance. She is not ill-appearing, toxic-appearing or diaphoretic.     Comments: Cachectic appearing elderly female.  Lying in the Semi-Fowler's position.  Speaking clearly and following commands.  HENT:     Head: Normocephalic and atraumatic.     Right Ear: External ear normal.     Left Ear: External ear normal.     Nose: Nose normal.     Mouth/Throat:     Mouth: Mucous membranes are moist.     Pharynx: Oropharynx is clear. No oropharyngeal exudate or posterior oropharyngeal erythema.  Eyes:     General: No scleral icterus.       Right eye: No discharge.        Left eye: No discharge.     Extraocular Movements: Extraocular movements intact.     Conjunctiva/sclera: Conjunctivae normal.  Neck:     Comments: Mild TTP noted diffusely along the midline cervical spine.  No midline thoracic or lumbar spine tenderness noted.  No step-offs,  crepitus, or deformities.   Cardiovascular:     Rate and Rhythm: Normal rate and regular rhythm.     Pulses: Normal pulses.     Heart sounds: Normal heart sounds. No murmur heard.   No friction rub. No gallop.  Pulmonary:     Effort: Pulmonary effort is normal. No respiratory distress.     Breath sounds: Normal breath sounds. No stridor. No wheezing, rhonchi or rales.     Comments: Lungs are clear to auscultation bilaterally.  No anterior chest wall tenderness. Chest:     Chest wall: No tenderness.  Abdominal:     General: Abdomen is flat.     Palpations: Abdomen is soft.     Tenderness: There is no abdominal tenderness.     Comments: Abdomen is  soft and nontender.  Musculoskeletal:        General: Tenderness present. Normal range of motion.     Cervical back: Normal range of motion and neck supple. Tenderness present.     Comments: Hard cast noted to the right lower leg.  Good cap refill in the toes of the right foot.  Distal sensation intact.  Abrasion noted to the left knee.  Additional two 1 cm skin tears noted to the right forearm.  No active bleeding.  Bilateral knees: Moderate TTP noted to the bilateral knees.  No deformities.  Unable to assess range of motion due to the patient's pain.  Right hip: Mild TTP noted to the right hip.  No obvious deformities.  Right clavicle: Mild tenderness appreciated along the right clavicle.  No obvious deformities.  No skin tenting.  Skin:    General: Skin is warm and dry.  Neurological:     General: No focal deficit present.     Mental Status: She is alert and oriented to person, place, and time.     Comments: A&O x3.  Speaking clearly and coherently.  Unable to move the right lower extremity due to the hard cast in place but otherwise is moving her extremities and following commands.  Psychiatric:        Mood and Affect: Mood normal.        Behavior: Behavior normal.    ED Results / Procedures / Treatments   Labs (all labs ordered  are listed, but only abnormal results are displayed) Labs Reviewed - No data to display  EKG None  Radiology DG Clavicle Right  Result Date: 05/18/2022 CLINICAL DATA:  Pain after fall EXAM: RIGHT CLAVICLE - 2+ VIEWS COMPARISON:  None Available. FINDINGS: There is no evidence of fracture or other focal bone lesions. Soft tissues are unremarkable. IMPRESSION: Negative. Electronically Signed   By: Dorise Bullion III M.D.   On: 05/18/2022 18:59   DG Tibia/Fibula Left  Result Date: 05/18/2022 CLINICAL DATA:  fall EXAM: LEFT TIBIA AND FIBULA - 2 VIEW COMPARISON:  None Available. FINDINGS: There is no evidence of fracture or other focal bone lesions. Soft tissues are unremarkable. IMPRESSION: Negative. Electronically Signed   By: Iven Finn M.D.   On: 05/18/2022 19:09   DG Tibia/Fibula Right  Result Date: 05/18/2022 CLINICAL DATA:  Fall today. EXAM: RIGHT TIBIA AND FIBULA - 2 VIEW COMPARISON:  04/09/2022 FINDINGS: Lateral plate and screw fixation of distal fibular fracture. Medial plate and screw fixation of distal tibial fracture. Hardware is intact. The distal fibular fracture line remains visible. There is no new fracture. Proximal tibia and fibula are intact. Overlying cast material limits osseous and soft tissue fine detail. IMPRESSION: No acute fracture of the right lower leg. Post ORIF of distal fibular and tibial fractures. Hardware is intact. Electronically Signed   By: Keith Rake M.D.   On: 05/18/2022 19:02   DG Ankle 2 Views Right  Result Date: 05/18/2022 CLINICAL DATA:  fall EXAM: RIGHT ANKLE - 2 VIEW COMPARISON:  X-ray intraop 04/26/2022, CT ankle 04/19/2022 FINDINGS: Overlying cast. Plate and screw fixation of distal fibula and medial malleolus. Redemonstration of a minimally displaced subacute posterior malleolar fracture. There is no evidence of acute displaced fracture, dislocation, or joint effusion. There is no evidence of arthropathy or other focal bone abnormality. Soft  tissues are unremarkable. IMPRESSION: 1. No acute displaced fracture or dislocation. 2. Status post plate and screw fixation of a subacute distal fibular and medial malleolar  fracture. Redemonstration of a subacute posterior malleolar fracture. Limited evaluation for healing due to overlying cast. Electronically Signed   By: Iven Finn M.D.   On: 05/18/2022 19:11   CT Head Wo Contrast  Result Date: 05/18/2022 CLINICAL DATA:  Head trauma, minor (Age >= 65y); Neck pain, acute, no red flags. Fall EXAM: CT HEAD WITHOUT CONTRAST CT CERVICAL SPINE WITHOUT CONTRAST TECHNIQUE: Multidetector CT imaging of the head and cervical spine was performed following the standard protocol without intravenous contrast. Multiplanar CT image reconstructions of the cervical spine were also generated. RADIATION DOSE REDUCTION: This exam was performed according to the departmental dose-optimization program which includes automated exposure control, adjustment of the mA and/or kV according to patient size and/or use of iterative reconstruction technique. COMPARISON:  None Available. FINDINGS: CT HEAD FINDINGS Brain: No evidence of large-territorial acute infarction. No parenchymal hemorrhage. No mass lesion. No extra-axial collection. No mass effect or midline shift. No hydrocephalus. Basilar cisterns are patent. Vascular: No hyperdense vessel. Atherosclerotic calcifications are present within the cavernous internal carotid arteries. Skull: No acute fracture or focal lesion. Sinuses/Orbits: Paranasal sinuses and mastoid air cells are clear. The orbits are unremarkable. Other: None. CT CERVICAL SPINE FINDINGS Alignment: Reversal of the normal cervical lordosis centered at the C5 through C7 levels due to degenerative changes. Otherwise normal alignment. Skull base and vertebrae: Degenerative changes most prominent at the C5 through C7 levels. No associated severe osseous neural foraminal or central canal stenosis. No acute fracture. No  aggressive appearing focal osseous lesion or focal pathologic process. Soft tissues and spinal canal: No prevertebral fluid or swelling. No visible canal hematoma. Upper chest: Centrilobular emphysematous changes. Other: None. IMPRESSION: 1. No acute intracranial abnormality. 2. No acute displaced fracture or traumatic listhesis of the cervical spine. 3.  Emphysema (ICD10-J43.9). Electronically Signed   By: Iven Finn M.D.   On: 05/18/2022 19:03   CT Cervical Spine Wo Contrast  Result Date: 05/18/2022 CLINICAL DATA:  Head trauma, minor (Age >= 65y); Neck pain, acute, no red flags. Fall EXAM: CT HEAD WITHOUT CONTRAST CT CERVICAL SPINE WITHOUT CONTRAST TECHNIQUE: Multidetector CT imaging of the head and cervical spine was performed following the standard protocol without intravenous contrast. Multiplanar CT image reconstructions of the cervical spine were also generated. RADIATION DOSE REDUCTION: This exam was performed according to the departmental dose-optimization program which includes automated exposure control, adjustment of the mA and/or kV according to patient size and/or use of iterative reconstruction technique. COMPARISON:  None Available. FINDINGS: CT HEAD FINDINGS Brain: No evidence of large-territorial acute infarction. No parenchymal hemorrhage. No mass lesion. No extra-axial collection. No mass effect or midline shift. No hydrocephalus. Basilar cisterns are patent. Vascular: No hyperdense vessel. Atherosclerotic calcifications are present within the cavernous internal carotid arteries. Skull: No acute fracture or focal lesion. Sinuses/Orbits: Paranasal sinuses and mastoid air cells are clear. The orbits are unremarkable. Other: None. CT CERVICAL SPINE FINDINGS Alignment: Reversal of the normal cervical lordosis centered at the C5 through C7 levels due to degenerative changes. Otherwise normal alignment. Skull base and vertebrae: Degenerative changes most prominent at the C5 through C7 levels.  No associated severe osseous neural foraminal or central canal stenosis. No acute fracture. No aggressive appearing focal osseous lesion or focal pathologic process. Soft tissues and spinal canal: No prevertebral fluid or swelling. No visible canal hematoma. Upper chest: Centrilobular emphysematous changes. Other: None. IMPRESSION: 1. No acute intracranial abnormality. 2. No acute displaced fracture or traumatic listhesis of the cervical spine. 3.  Emphysema (  ICD10-J43.9). Electronically Signed   By: Iven Finn M.D.   On: 05/18/2022 19:03   DG Knee Complete 4 Views Left  Result Date: 05/18/2022 CLINICAL DATA:  fall EXAM: LEFT KNEE - COMPLETE 4+ VIEW COMPARISON:  None Available. FINDINGS: No evidence of fracture, dislocation, or joint effusion. No evidence of arthropathy or other focal bone abnormality. Soft tissues are unremarkable. IMPRESSION: Negative. Electronically Signed   By: Iven Finn M.D.   On: 05/18/2022 19:04   DG Knee Complete 4 Views Right  Result Date: 05/18/2022 CLINICAL DATA:  Fall today. EXAM: RIGHT KNEE - COMPLETE 4+ VIEW COMPARISON:  None Available. FINDINGS: The bones are subjectively under mineralized. No fracture or dislocation. Alignment is maintained. No significant knee joint effusion. Cast material in the lower leg partially included. IMPRESSION: No fracture or dislocation of the right knee. Electronically Signed   By: Keith Rake M.D.   On: 05/18/2022 19:00   DG Hip Unilat W or Wo Pelvis 2-3 Views Right  Result Date: 05/18/2022 CLINICAL DATA:  fall EXAM: DG HIP (WITH OR WITHOUT PELVIS) 2-3V RIGHT COMPARISON:  None Available. FINDINGS: There is no evidence of hip fracture or dislocation of the right hip. No acute displaced fracture or diastasis of the bones pelvis. Frontal view of the left hip grossly unremarkable. Limited evaluation of the sacrum- overlapping osseous structures and overlying soft tissues. there is no evidence of arthropathy or other focal bone  abnormality. IMPRESSION: Negative for acute traumatic injury. Electronically Signed   By: Iven Finn M.D.   On: 05/18/2022 19:00    Procedures Procedures   Medications Ordered in ED Medications  oxyCODONE (Oxy IR/ROXICODONE) immediate release tablet 5 mg (5 mg Oral Given 05/18/22 1843)    ED Course/ Medical Decision Making/ A&P                           Medical Decision Making Amount and/or Complexity of Data Reviewed Radiology: ordered.  Risk Prescription drug management.   Pt is a 66 y.o. female who presents to the emergency department due to a mechanical fall that occurred prior to arrival.  Imaging: X-ray of the right hip and pelvis shows negative for acute traumatic injury.  X-ray of the right knee shows no fracture or dislocation of the right knee.  X-ray of the left knee is negative.  CT scan of the cervical spine without contrast as well as of the head shows no acute intracranial abnormality.  No acute displaced fracture or traumatic listhesis of the cervical spine.  Emphysema.  X-ray of the right ankle shows 1. No acute displaced fracture or dislocation. 2. Status post plate and screw fixation of a subacute distal fibular and medial malleolar fracture. Redemonstration of a subacute posterior malleolar fracture. Limited evaluation for healing due to overlying cast.  X-ray of the right tibia/fibula shows No acute fracture of the right lower leg. Post ORIF of distal fibular and tibial fractures. Hardware is intact.   X-ray of the left tibia/fibula is negative.  X-ray of the right clavicle is negative.  I, Rayna Sexton, PA-C, personally reviewed and evaluated these images and lab results as part of my medical decision-making.  Physical exam with findings as noted above.  I obtained extensive imaging that all appears reassuring.  Discussed findings with the patient and she appears quite relieved.  Recent ORIF of the right leg.  Hardware appears intact on x-rays.   She was given a dose of oxycodone for  acute pain.  Patient appears stable for discharge at this time.  Recommended that she follow-up with her orthopedic surgeon.  She notes recently running out of her narcotic pain medications.  Per PDMP review she has received multiple recent prescriptions for narcotic pain medications, most recently 2 days ago.  She was given a 1 day course of oxycodone and I urged her to follow back up with her PCP as well as orthopedic surgery for further refills.  We discussed return precautions.  Her questions were answered and she was amicable at the time of discharge.  Note: Portions of this report may have been transcribed using voice recognition software. Every effort was made to ensure accuracy; however, inadvertent computerized transcription errors may be present.   Final Clinical Impression(s) / ED Diagnoses Final diagnoses:  Fall, initial encounter  Injury of head, initial encounter  Strain of neck muscle, initial encounter  Right hip pain  Acute pain of both knees    Rx / DC Orders ED Discharge Orders          Ordered    oxyCODONE (ROXICODONE) 5 MG immediate release tablet  Every 6 hours PRN        05/18/22 2017              Rayna Sexton, PA-C 05/18/22 2022    Noemi Chapel, MD 05/21/22 1601

## 2022-05-18 NOTE — Discharge Instructions (Addendum)
I have prescribed you a strong narcotic called oxycodone. Please only take this as prescribed. Do not drive or operate heavy machinery after taking this medication. Do not mix it with alcohol.   Please follow-up with your surgeon regarding your symptoms as well as this visit today.  Please return to the emergency department with any new or worsening symptoms.

## 2022-05-18 NOTE — ED Triage Notes (Signed)
Pt reports had a hard fall today.  Pt had recent surgery to her L ankle from another fall.  Pt is concerned about damage to her surgical ankle and wants to make sure that her hardware isnt displaced.  Pt has skin tear that she has bandaged at home.  Resp even and unlabored.  Skin warm and dry.

## 2022-05-22 ENCOUNTER — Ambulatory Visit (INDEPENDENT_AMBULATORY_CARE_PROVIDER_SITE_OTHER): Payer: Medicare Other | Admitting: Pulmonary Disease

## 2022-05-22 ENCOUNTER — Encounter: Payer: Self-pay | Admitting: Pulmonary Disease

## 2022-05-22 VITALS — BP 118/66 | HR 72 | Temp 98.2°F

## 2022-05-22 DIAGNOSIS — J432 Centrilobular emphysema: Secondary | ICD-10-CM | POA: Diagnosis not present

## 2022-05-22 NOTE — Patient Instructions (Signed)
Will arrange for pulmonary function test at [facility]  Follow up in 2 months

## 2022-05-22 NOTE — Progress Notes (Signed)
[facility]  Chief Complaint  Patient presents with   Hospitalization Follow-up    [facility] admit form 5/10-5/16. Was scheduled to have surgery but O2 sats dropped below 90      Past Surgical History:  She  has a past surgical history that includes Cholecystectomy; Eye surgery; Fracture surgery; laparoscopic appendectomy (N/A, 01/08/2019); and ORIF ankle fracture (Right, 04/26/2022).  Past Medical History:  Chronic pain, Depression, Diverticulitis, IBS, Myelomalacia, Panic disorder, PTSD, TMJ  Constitutional:  BP 118/66 (BP Location: Left Arm, Patient Position: Sitting)   Pulse 72   Temp 98.2 F (36.8 C) (Temporal)   SpO2 96% Comment: ra  Brief Summary:  [name] is a 66 y.o. female former smoker with COPD with emphysema.       Subjective:   She is here with her husband.  She was in hospital with ankle fracture.  She was noted to have transient hypoxia.  She didn't need supplemental oxygen after discharge.  She hasn't smoked cigarettes in 2 weeks.  She is using nicotine patch.  She was smoking 1 ppd.  Since she stopped smoking she is no longer having cough, wheeze, or sputum.  She hasn't needed to use albuterol recently.  Still using symbicort, but not sure how much this helps.  She isn't having as much trouble with daytime sleepiness compared to before.  [facility]  Physical Exam:   Appearance - thin, sitting in wheelchair  ENMT - no sinus tenderness, no oral exudate, no LAN, Mallampati 2 airway, no stridor  Respiratory - equal breath sounds bilaterally, no wheezing or rales  CV - s1s2 regular rate and rhythm, no murmurs  Ext - Rt ankle in a cast  Skin - no rashes  Psych - normal mood and affect   Pulmonary testing:  A1AT 04/26/22 >> 180  Chest Imaging:  CT angio chest 04/24/22 >> mild emphysema  Sleep Tests:    Cardiac Tests:  Echo 04/26/22 >> EF 65 to 70%, grade 1  DD  Social History:  She  reports that she quit smoking about 21 months ago. Her smoking use included cigarettes. She has a 50.00 pack-year smoking history. She has never used smokeless tobacco. She reports that she does not drink alcohol and does not use drugs.  Family History:  Her family history includes Coronary artery disease in her father; Migraines in her mother; Polymyalgia rheumatica in her mother.     Assessment/Plan:   Emphysema with presumed COPD. - will arrange for pulmonary function test to further assess for airflow obstruction - continue symbicort with prn albuterol for now - much of her symptoms have improved since she stopped smoking; might be able to step down her inhaler regimen if she is able to stay off cigarettes  History of tobacco abuse. - discussed techniques to help make sure she doesn't start smoking again - continue nicotine patch as needed - will need to discuss getting her set up for lung cancer screening program at some point in the future; she had CT chest in May 2023, so likely can defer until 2024  Daytime hypersomnia. - seems improved - she [facility]  Time Spent Involved in Patient Care on Day of Examination:  45 minutes  Follow up:   Patient Instructions  Will arrange for pulmonary function test at [facility]  Follow up in 2 months  Medication List:   Allergies as  of 05/22/2022       Reactions   Amoxicillin-pot Clavulanate Other (See Comments)   GI upset   Naltrexone Other (See Comments)   Hallucinations, sleepiness, withdrawal s/s, poor apepitie   Nsaids    Advised not to take    Toradol [ketorolac Tromethamine] Nausea And Vomiting, Swelling   TONGUE SWELLING   Tramadol Nausea And Vomiting   Atorvastatin Other (See Comments), Itching, Nausea Only, Tinitus   Benadryl [diphenhydramine Hcl] Palpitations   Insomnia   Ceftin [cefuroxime Axetil] Rash   Compazine Palpitations   Insomnia    Epinephrine Palpitations   Insomina        Medication List        Accurate as of May 22, 2022  3:33 PM. If you have any questions, ask your nurse or doctor.          STOP taking these medications    ipratropium 0.03 % nasal spray Commonly known as: ATROVENT Stopped by: Coralyn Helling, MD   neomycin-polymyxin-dexamethasone 0.1 % ophthalmic suspension Commonly known as: MAXITROL Stopped by: Coralyn Helling, MD   ondansetron 8 MG tablet Commonly known as: ZOFRAN Stopped by: Coralyn Helling, MD   Xarelto 10 MG Tabs tablet Generic drug: rivaroxaban Stopped by: Coralyn Helling, MD       TAKE these medications    acetaminophen 500 MG tablet Commonly known as: TYLENOL Take 1,000 mg by mouth 4 (four) times daily as needed (pain).   albuterol 108 (90 Base) MCG/ACT inhaler Commonly known as: VENTOLIN HFA Inhale 2 puffs into the lungs daily as needed for wheezing or shortness of breath. What changed: Another medication with the same name was changed. Make sure you understand how and when to take each.   albuterol (2.5 MG/3ML) 0.083% nebulizer solution Commonly known as: PROVENTIL Take 3 mLs (2.5 mg total) by nebulization every 4 (four) hours as needed for wheezing or shortness of breath. What changed: when to take this   Azelastine HCl 137 MCG/SPRAY Soln Place 1 spray into the nose daily as needed (for nasal congestion).   baclofen 10 MG tablet Commonly known as: LIORESAL Take 0.5 tablets (5 mg total) by mouth 2 (two) times daily. What changed:  how much to take when to take this   budesonide-formoterol 160-4.5 MCG/ACT inhaler Commonly known as: SYMBICORT Inhale 2 puffs into the lungs 2 (two) times daily.   carboxymethylcellulose 0.5 % Soln Commonly known as: REFRESH PLUS Place 2-4 drops into both eyes See admin instructions. 2-4 drops into both eyes three times a day as needed for dry eye   chlorhexidine 0.12 % solution Commonly known as: PERIDEX Use as directed 15 mLs  in the mouth or throat daily as needed (mouth discomfort).   clonazePAM 1 MG tablet Commonly known as: KLONOPIN Take 0.5 tablets (0.5 mg total) by mouth 2 (two) times daily as needed for anxiety. *May take one additional tablet as needed for anxiety/PTSD What changed:  how much to take when to take this additional instructions   fluticasone 50 MCG/ACT nasal spray Commonly known as: FLONASE Place 2 sprays into both nostrils 2 (two) times daily as needed for allergies.   gabapentin 300 MG capsule Commonly known as: NEURONTIN Take 900 mg by mouth See admin instructions. 900 mg twice a day and 600 mg once daily.   loperamide 2 MG capsule Commonly known as: IMODIUM Take 2 mg by mouth 4 (four) times daily as needed for diarrhea or loose stools.   Muro 128 5 % ophthalmic ointment  Generic drug: sodium chloride Place 1 application. into both eyes at bedtime.   oxyCODONE 5 MG immediate release tablet Commonly known as: Roxicodone Take 1 tablet (5 mg total) by mouth every 6 (six) hours as needed for severe pain.   pyridOXINE 50 MG tablet Commonly known as: VITAMIN B-6 Take 50 mg by mouth daily.   Vitamin D3 125 MCG (5000 UT) Caps Take 1 capsule by mouth in the morning and at bedtime.        Signature:  Coralyn Helling, MD [facility] Pulmonary/Critical Care Pager - [phone] 05/22/2022, 3:33 PM

## 2022-07-04 ENCOUNTER — Telehealth: Payer: Self-pay | Admitting: Pulmonary Disease

## 2022-07-04 NOTE — Telephone Encounter (Signed)
Called and spoke to [place] Rrt at [facility] and she scheduled patient for 08/20/2022 Tuesday at 1:00 pm. Called and gave appt time and date to patient. Patient confirms this will work for her. PFT paper instructions/appt mailed to patient.  Nothing further needed.

## 2022-07-08 ENCOUNTER — Other Ambulatory Visit: Payer: Medicare Other | Admitting: Primary Care

## 2022-07-08 DIAGNOSIS — S82891G Other fracture of right lower leg, subsequent encounter for closed fracture with delayed healing: Secondary | ICD-10-CM

## 2022-07-08 DIAGNOSIS — Z515 Encounter for palliative care: Secondary | ICD-10-CM

## 2022-07-08 DIAGNOSIS — G9589 Other specified diseases of spinal cord: Secondary | ICD-10-CM

## 2022-07-08 NOTE — Progress Notes (Signed)
    Therapist, nutritional Palliative Care Consult Note Telephone: [phone]  Fax: [phone]    Date of encounter: 07/08/22 PATIENT NAME: [name] [address] [place] [address]   [phone] (home)  DOB: 09-Mar-1956 MRN: [id number] PRIMARY CARE PROVIDER:    Peta Peachey Robert, MD,  [address] [place] [address] [phone]  REFERRING PROVIDER:   Rumaldo Difatta Robert, MD [address] [place],  [place] [address] [phone]  RESPONSIBLE PARTY:    Contact Information     Name Relation Home Work Mobile   [name] Spouse [phone]  [phone]      Due to the COVID-19 crisis, this visit was done via telemedicine from my office and it was initiated and consent by this patient and or family.  I connected with  [name] OR PROXY on 07/08/22 by a audio  enabled telemedicine application and verified that I am speaking with the correct person using two identifiers.   I discussed the limitations of evaluation and management by telemedicine. The patient expressed understanding and agreed to proceed.  Palliative Care was asked to follow this patient by consultation request of  Breanna Adcock Robert, MD to address advance care planning.                                   ASSESSMENT AND PLAN / RECOMMENDATIONS:   Advance Care Planning/Goals of Care: Goals include to maximize quality of life and symptom management. Patient/health care surrogate gave his/her permission to discuss.Our advance care planning conversation included a discussion about:    The value and importance of advance care planning  Exploration of personal, cultural or spiritual beliefs that might influence medical decisions  Exploration of goals of care in the event of a sudden injury or illness  Identification of a healthcare agent - [name] Review aof an  advance directive document . CODE STATUS: DNR Outlined recent health issues. She has asked for telephone visit. Outlines fracture of  4/23, has non weight bearing boot and now wheel chair has malfunctioned.  Discusses her increased immobility and slow healing. Cannot find adequate w/c repair. Seeking outlet for that but anticipates to be walking in several weeks. Was referred to [facility] for pian management but that appt has not been made. Orthopedists made referral. Cries at recounting how she cannot make an appointment with her MD at [facility] due to his upcoming retirement, and that she has acute on chronic pain now. Her pcp is also retiring. States recent drug screen at PCP, and states fortunately it was negative.  States she has some new nerve disturbances. We will have home visit in 2 weeks.   Follow up Palliative Care Visit: Palliative care will continue to follow for complex medical decision making, advance care planning, and clarification of goals. Return 2 weeks or prn.  I spent 28 minutes providing this consultation. More than 50% of the time in this consultation was spent in counseling and care coordination.  Thank you for the opportunity to participate in the care of [name].  The palliative care team will continue to follow. Please call our office at [phone] if we can be of additional assistance.   Eliezer Lofts DNP, MPH, AGPCNP-BC, [facility]

## 2022-07-24 ENCOUNTER — Other Ambulatory Visit: Payer: Medicare Other | Admitting: Primary Care

## 2022-07-24 DIAGNOSIS — F4312 Post-traumatic stress disorder, chronic: Secondary | ICD-10-CM

## 2022-07-24 DIAGNOSIS — Z515 Encounter for palliative care: Secondary | ICD-10-CM

## 2022-07-24 DIAGNOSIS — G9589 Other specified diseases of spinal cord: Secondary | ICD-10-CM

## 2022-07-24 DIAGNOSIS — G373 Acute transverse myelitis in demyelinating disease of central nervous system: Secondary | ICD-10-CM

## 2022-07-24 NOTE — Progress Notes (Signed)
  [facility] Consult Note Telephone: [phone]  Fax: [phone]    Date of encounter: 07/24/22 12:41 PM PATIENT NAME: [name] [address] [place] [address]   [phone] (home)  DOB: 12/12/1956 MRN: [id number] PRIMARY CARE PROVIDER:    Kikel, Stephen, MD,  [address] [place] [address] [phone]  REFERRING PROVIDER:   Kikel, Stephen, MD [address] [place],  [place] [address] [phone]  RESPONSIBLE PARTY:    Contact Information     Name Relation Home Work Mobile   [name],[name] Spouse [phone]  [phone]      I met face to face with patient and family in  home. Palliative Care was asked to follow this patient by consultation request of  Kikel, Stephen, MD to address advance care planning and complex medical decision making. This is a follow up visit.                                   ASSESSMENT AND PLAN / RECOMMENDATIONS:   Advance Care Planning/Goals of Care: Goals include to maximize quality of life and symptom management. Patient/health care surrogate gave his/her permission to discuss.Our advance care planning conversation included a discussion about:    The value and importance of advance care planning  Exploration of personal, cultural or spiritual beliefs that might influence medical decisions  Exploration of goals of care in the event of a sudden injury or illness  Identification of a healthcare agent - [name] CODE STATUS: DNR  Symptom Management/Plan:  Pain: Continues to have pain in R leg from fracture. She is followed by ortho. She has been getting medications from PCP for transport, but has been referred to [place] [facility] pain management. She has also not been scheduled yet. Continues to endorse chronic unmanaged pain.  Today she is sitting up in her home and appears to have more energy than usual.  She did have a fall several days ago but it did not disturb her broken  ankle.  Has just started PT, in [place]. 2-3 x/ week x 4 weeks.   Patient crying as she outlines her pain and inability to get medications. Recommended need to have psychiatry involved. Feels psychiatric medications make one too sleepy. Appears to not be able to make appointment with pain management I'd asked for her to be referred to. Continue with current pain management clinic.  Follow up Palliative Care Visit: Palliative care will continue to follow for complex medical decision making, advance care planning, and clarification of goals. Return 8-12 weeks or prn.  I spent 60 minutes providing this consultation. More than 50% of the time in this consultation was spent in counseling and care coordination.  PPS: 40%  HOSPICE ELIGIBILITY/DIAGNOSIS: TBD  Chief Complaint: chronic pain, anxiety, immobility  HISTORY OF PRESENT ILLNESS:  [name] is a 65 y.o. year old female  with  debility, chronic pain,  myelomalacia  . Patient seen today to review palliative care needs to include medical decision making and advance care planning as appropriate.   History obtained from review of EMR, discussion with primary team, and interview with family, facility staff/caregiver and/or [name].  I reviewed available labs, medications, imaging, studies and related documents from the EMR.  Records reviewed and summarized above.   ROS  General: NAD ENMT: denies dysphagia Cardiovascular: denies chest pain, denies DOE Pulmonary: denies cough, denies   increased SOB Abdomen: endorses good appetite, denies constipation, endorses continence of bowel GU: denies dysuria, endorses continence of urine MSK:  denies  increased weakness, several  falls reported, uses walker Skin: denies rashes or wounds Neurological: endorses pain, denies insomnia, but awakens in pain Psych: Endorses positive mood  Physical Exam: Current and past weights: not available Constitutional: NAD General: frail appearing,  thin EYES: anicteric sclera, lids intact, no discharge  ENMT: intact hearing, oral mucous membranes moist, dentition intact CV: no LE edema Pulmonary: no increased work of breathing, no cough, room air Abdomen: intake 70%,  no ascites MSK: + sarcopenia, moves all extremities, ambulatory with walker,  cast on R LE Skin: warm and dry, no rashes or wounds on visible skin Neuro:  + generalized weakness,  no cognitive impairment, anxious affect  Thank you for the opportunity to participate in the care of [name].  The palliative care team will continue to follow. Please call our office at [phone] if we can be of additional assistance.   Jason Coop, NP DNP, AGPCNP-BC  COVID-19 PATIENT SCREENING TOOL Asked and negative response unless otherwise noted:   Have you had symptoms of covid, tested positive or been in contact with someone with symptoms/positive test in the past 5-10 days?

## 2022-07-25 ENCOUNTER — Ambulatory Visit: Payer: Medicare Other | Admitting: Pulmonary Disease

## 2022-08-06 ENCOUNTER — Telehealth: Payer: Self-pay

## 2022-08-06 NOTE — Telephone Encounter (Signed)
425 pm. Request received from Clearnce Sorrel, NP to return call.   Patient inquiring about pain clinic in [place] with Dr. Cherylann Ratel.  Referral has been sent but she believes it was denied.  Patient asking for assistance with follow up.  Clearnce Sorrel, NP updated.

## 2022-08-07 ENCOUNTER — Telehealth: Payer: Self-pay

## 2022-08-07 NOTE — Telephone Encounter (Signed)
815 am.  Incoming call from Dr. Garnett Farm office.  Referral has been declined and MD does not have anything more to offer patient.  Letter was also sent out to PCP with this information.   Update provided to Clearnce Sorrel, NP.

## 2022-08-07 NOTE — Telephone Encounter (Signed)
8 am.  Phone call made to [facility] to follow up on referral with Dr. Cherylann Ratel. No answer.  Message left requesting a call back.

## 2022-08-08 ENCOUNTER — Telehealth: Payer: Self-pay

## 2022-08-08 NOTE — Telephone Encounter (Signed)
1237 pm.  Follow up call made to patient to advise of the reason she was denied by Dr. Garnett Farm office and letter was sent to PCP.  Patient voiced appreciation for call back.  No other concerns voiced at this time.

## 2022-08-20 ENCOUNTER — Ambulatory Visit (HOSPITAL_COMMUNITY): Admission: RE | Admit: 2022-08-20 | Payer: Medicare Other | Source: Ambulatory Visit

## 2022-10-02 ENCOUNTER — Other Ambulatory Visit: Payer: Medicare Other | Admitting: Primary Care

## 2022-10-08 ENCOUNTER — Ambulatory Visit (HOSPITAL_COMMUNITY)
Admission: RE | Admit: 2022-10-08 | Discharge: 2022-10-08 | Disposition: A | Discharge status: Home / Self Care | Payer: Medicare Other | Source: Ambulatory Visit | Attending: Pulmonary Disease | Admitting: Pulmonary Disease

## 2022-10-08 DIAGNOSIS — J432 Centrilobular emphysema: Secondary | ICD-10-CM | POA: Insufficient documentation

## 2022-10-08 LAB — PULMONARY FUNCTION TEST
DL/VA % pred: 53 %
DL/VA: 2.32 ml/min/mmHg/L
DLCO unc % pred: 48 %
DLCO unc: 8.35 ml/min/mmHg
FEF 25-75 Post: 0.8 L/sec
FEF 25-75 Pre: 0.6 L/sec
FEF2575-%Change-Post: 31 %
FEF2575-%Pred-Post: 41 %
FEF2575-%Pred-Pre: 31 %
FEV1-%Change-Post: 7 %
FEV1-%Pred-Post: 67 %
FEV1-%Pred-Pre: 63 %
FEV1-Post: 1.39 L
FEV1-Pre: 1.29 L
FEV1FVC-%Change-Post: -2 %
FEV1FVC-%Pred-Pre: 78 %
FEV6-%Change-Post: 11 %
FEV6-%Pred-Post: 88 %
FEV6-%Pred-Pre: 79 %
FEV6-Post: 2.28 L
FEV6-Pre: 2.06 L
FEV6FVC-%Change-Post: 1 %
FEV6FVC-%Pred-Post: 102 %
FEV6FVC-%Pred-Pre: 101 %
FVC-%Change-Post: 10 %
FVC-%Pred-Post: 87 %
FVC-%Pred-Pre: 79 %
FVC-Post: 2.35 L
FVC-Pre: 2.13 L
Post FEV1/FVC ratio: 59 %
Post FEV6/FVC ratio: 98 %
Pre FEV1/FVC ratio: 61 %
Pre FEV6/FVC Ratio: 97 %
RV % pred: 246 %
RV: 4.65 L
TLC % pred: 154 %
TLC: 6.9 L

## 2022-10-08 MED ORDER — ALBUTEROL SULFATE (2.5 MG/3ML) 0.083% IN NEBU
2.5000 mg | INHALATION_SOLUTION | Freq: Once | RESPIRATORY_TRACT | Status: AC
  Administered 2022-10-08: 2.5 mg via RESPIRATORY_TRACT

## 2022-10-16 ENCOUNTER — Other Ambulatory Visit: Payer: Medicare Other | Admitting: Primary Care

## 2022-10-16 DIAGNOSIS — G9589 Other specified diseases of spinal cord: Secondary | ICD-10-CM

## 2022-10-16 DIAGNOSIS — Z720 Tobacco use: Secondary | ICD-10-CM

## 2022-10-16 DIAGNOSIS — Z515 Encounter for palliative care: Secondary | ICD-10-CM

## 2022-10-16 DIAGNOSIS — F419 Anxiety disorder, unspecified: Secondary | ICD-10-CM

## 2022-10-16 NOTE — Progress Notes (Signed)
[facility] Consult Note Telephone: [phone]  Fax: [phone]    Date of encounter: 10/16/22 1:27 PM PATIENT NAME: [name] [place] [address]   [phone] (home)  DOB: [birth year] MRN: [id number] PRIMARY CARE PROVIDER:    Vidal Schwalbe, MD,  [address] [place] [address] [phone]  REFERRING PROVIDER:   Vidal Schwalbe, MD [address] [place],  [place] [address] [phone]  RESPONSIBLE PARTY:    Contact Information     Name Relation Home Work Mobile   Choy,[name] [phone]  [phone]       I met face to face with patient and family in  home. Palliative Care was asked to follow this patient by consultation request of  Vidal Schwalbe, MD to address advance care planning and complex medical decision making. This is a follow up visit.                                   ASSESSMENT AND PLAN / RECOMMENDATIONS:   Advance Care Planning/Goals of Care: Goals include to maximize quality of life and symptom management. Patient/health care surrogate gave his/her permission to discuss. Our advance care planning conversation included a discussion about:    The value and importance of advance care planning  Experiences with loved ones who have been seriously ill or have died  Exploration of personal, cultural or spiritual beliefs that might influence medical decisions  Exploration of goals of care in the event of a sudden injury or illness  Identification of a healthcare agent- husband CODE STATUS: dnr  Symptom Management/Plan:  Pain: States she got to pain management at [facility] in [place]. Sees Benedetto Goad NP. She endorses a lot of on going pain. Going to PT but not fully consistent. Taking gabapentin but may benefit from lyrica. Endorses propping on a pillow at hs.  COPD: Has had pulmonary functions, appears to be early COPD. We discussed her smoking cessation. She states  she plans to stop due to cost, although she would not want to stop.  Mood : Long history of depression/anxiety but refuses SNRI or SSRI, due to "these only numb you". States she refuses to try any, that in the past they never worked. Endorses having success in CBT.  Depression is ongoing. Cries with recounting recent medical appointments.  Discusses her father and possible pursuing inheritance.   Follow up Palliative Care Visit: No longer offering  home NP PC program.  I spent 60 minutes providing this consultation. More than 50% of the time in this consultation was spent in counseling and care coordination.  This visit was coded based on medical decision making (MDM).  PPS: 40%  HOSPICE ELIGIBILITY/DIAGNOSIS: TBD  Chief Complaint: chronic pain, immobility  HISTORY OF PRESENT ILLNESS:  [name] is a 66 y.o. year old female  with Transverse myelitis, chronic pain, decreased debility .   History obtained from review of EMR, discussion with primary team, and interview with family, facility staff/caregiver and/or Ms. [name].  I reviewed available labs, medications, imaging, studies and related documents from the EMR.  Records reviewed and summarized above.   ROS  General: NAD EYES: endorse  vision changes, disconjugate gaze ENMT: denies dysphagia Cardiovascular: denies chest pain, denies DOE Pulmonary: denies cough, denies increased SOB Abdomen: endorses fair appetite, denies constipation, endorses continence of bowel GU: denies dysuria, endorses continence of  urine MSK:   endorses increased weakness, no falls reported Skin: denies rashes or wounds Neurological: endorses pain, endorses insomnia Psych: Endorses positive mood Heme/lymph/immuno: denies bruises, abnormal bleeding  Physical Exam: Current and past weights: Constitutional: NAD General: frail appearing, thin EYES: anicteric sclera, lids intact, no discharge  ENMT: intact hearing, oral mucous membranes  moist Pulmonary: no increased work of breathing, no cough, room air Abdomen: intake 70%, soft and non tender, no ascites GU: deferred MSK:+, moves all extremities, ambulatory with walker  Skin: warm and dry, no rashes or wounds on visible skin Neuro:  + generalized weakness,  mild  cognitive impairment Psych: + anxious affect, A and O x 3 Hem/lymph/immuno: no widespread bruising  Thank you for the opportunity to participate in the care of Ms. Tarquinio.  The palliative care team will continue to follow. Please call our office at [phone] if we can be of additional assistance.   Jason Coop, NP   COVID-19 PATIENT SCREENING TOOL Asked and negative response unless otherwise noted:   Have you had symptoms of covid, tested positive or been in contact with someone with symptoms/positive test in the past 5-10 days?

## 2022-11-12 ENCOUNTER — Ambulatory Visit (INDEPENDENT_AMBULATORY_CARE_PROVIDER_SITE_OTHER): Payer: Medicare Other | Admitting: Pulmonary Disease

## 2022-11-12 ENCOUNTER — Encounter: Payer: Self-pay | Admitting: Pulmonary Disease

## 2022-11-12 VITALS — BP 120/82 | HR 78 | Temp 98.2°F | Ht 60.0 in | Wt 102.0 lb

## 2022-11-12 DIAGNOSIS — R0683 Snoring: Secondary | ICD-10-CM | POA: Diagnosis not present

## 2022-11-12 DIAGNOSIS — J432 Centrilobular emphysema: Secondary | ICD-10-CM

## 2022-11-12 DIAGNOSIS — Z87891 Personal history of nicotine dependence: Secondary | ICD-10-CM | POA: Diagnosis not present

## 2022-11-12 MED ORDER — BREZTRI AEROSPHERE 160-9-4.8 MCG/ACT IN AERO: 2.0000 | INHALATION_SPRAY | Freq: Two times a day (BID) | RESPIRATORY_TRACT | 5 refills | Status: DC

## 2022-11-12 MED ORDER — ALBUTEROL SULFATE (2.5 MG/3ML) 0.083% IN NEBU: 2.5000 mg | INHALATION_SOLUTION | Freq: Four times a day (QID) | RESPIRATORY_TRACT | 5 refills | Status: AC | PRN

## 2022-11-12 NOTE — Patient Instructions (Signed)
Breztri two puffs twice per day, and rinse your mouth after each use  Stop symbicort  Will arrange for a home nebulize machine  Will schedule a sleep study at [facility]   Will arrange for referral to lung cancer screening program  Follow up in 3 months

## 2022-11-12 NOTE — Progress Notes (Signed)
[facility], Critical [facility], and Sleep Medicine  Chief Complaint  Patient presents with   Follow-up    Pft results     Past Surgical History:  She  has a past surgical history that includes Cholecystectomy; Eye surgery; Fracture surgery; laparoscopic appendectomy (N/A, 01/08/2019); and ORIF ankle fracture (Right, 04/26/2022).  Past Medical History:  Chronic pain, Depression, Diverticulitis, IBS, Myelomalacia, Panic disorder, PTSD, TMJ  Constitutional:  BP 120/82   Pulse 78   Temp 98.2 F (36.8 C)   Ht 5' (1.524 m)   Wt 102 lb (46.3 kg)   SpO2 94% Comment: ra  BMI 19.92 kg/m   Brief Summary:  [name] is a 66 y.o. female former smoker with COPD with emphysema.       Subjective:   Her PFT showed mild to moderate obstruction, air trapping and moderate to severe diffusion defect.  She doesn't feel like symbicort is enough anymore.  She has cough and chest congestion and gets episodes of wheezing.  She has an old nebulizer and this helps.    She is still snoring and feeling sleepy during the day.  She wasn't able to complete sleep study at [facility] due to logistical issues.    Physical Exam:   Appearance - thin, sitting in wheelchair  ENMT - no sinus tenderness, no oral exudate, no LAN, Mallampati 2 airway, no stridor  Respiratory - equal breath sounds bilaterally, no wheezing or rales  CV - s1s2 regular rate and rhythm, no murmurs  Ext - Rt ankle in a cast  Skin - no rashes  Psych - normal mood and affect   Pulmonary testing:  A1AT 04/26/22 >> 180 PFT 10/08/22 >> FEV1 1.39 (67%), FEV1% 59, TLC 6.90 (154%), DLCO 48%  Chest Imaging:  CT angio chest 04/24/22 >> mild emphysema  Sleep Tests:    Cardiac Tests:  Echo 04/26/22 >> EF 65 to 70%, grade 1 DD  Social History:  She  reports that she quit smoking about 2 years ago. Her smoking use included cigarettes. She has a 50.00 pack-year smoking history. She has never used smokeless tobacco. She reports  that she does not drink alcohol and does not use drugs.  Family History:  Her family history includes Coronary artery disease in her father; Migraines in her mother; Polymyalgia rheumatica in her mother.     Assessment/Plan:   COPD with emphysema. - has progressive symptoms - will switch from symbicort to breztri - will arrange for albuterol by nebulizer  History of tobacco abuse. - will arrange for referral to lung cancer screening program  Daytime hypersomnia. - she has snoring - she might also have respiratory muscle weakness leading to hypoventilation in setting of cervical myelopathy - she has COPD - it is my medical opinion that additional sleep testing should be performed in a sleep lab; will arrange for split night sleep study at [facility]  Cervical myelopathy. - followed by neurology at [facility]  Rt ankle fracture with chronic pain. - followed at [facility]  Time Spent Involved in Patient Care on Day of Examination:  37 minutes  Follow up:   Patient Instructions  Breztri two puffs twice per day, and rinse your mouth after each use  Stop symbicort  Will arrange for a home nebulize machine  Will schedule a sleep study at [facility]   Will arrange for referral to lung cancer screening program  Follow up in 3 months  Medication List:   Allergies as of  11/12/2022       Reactions   Amoxicillin-pot Clavulanate Other (See Comments)   GI upset   Naltrexone Other (See Comments)   Hallucinations, sleepiness, withdrawal s/s, poor apepitie   Nsaids    Advised not to take    Toradol [ketorolac Tromethamine] Nausea And Vomiting, Swelling   TONGUE SWELLING   Tramadol Nausea And Vomiting   Atorvastatin Other (See Comments), Itching, Nausea Only, Tinitus   Benadryl [diphenhydramine Hcl] Palpitations   Insomnia   Ceftin [cefuroxime Axetil] Rash   Compazine Palpitations   Insomnia   Epinephrine Palpitations   Insomina        Medication List         Accurate as of November 12, 2022  2:49 PM. If you have any questions, ask your nurse or doctor.          STOP taking these medications    budesonide-formoterol 160-4.5 MCG/ACT inhaler Commonly known as: SYMBICORT Stopped by: Coralyn Helling, MD       TAKE these medications    acetaminophen 500 MG tablet Commonly known as: TYLENOL Take 1,000 mg by mouth 4 (four) times daily as needed (pain).   albuterol (2.5 MG/3ML) 0.083% nebulizer solution Commonly known as: PROVENTIL Take 3 mLs (2.5 mg total) by nebulization every 6 (six) hours as needed for wheezing or shortness of breath. What changed:  when to take this Another medication with the same name was removed. Continue taking this medication, and follow the directions you see here. Changed by: Coralyn Helling, MD   Azelastine HCl 137 MCG/SPRAY Soln Place 1 spray into the nose daily as needed (for nasal congestion).   baclofen 10 MG tablet Commonly known as: LIORESAL Take 0.5 tablets (5 mg total) by mouth 2 (two) times daily. What changed:  how much to take when to take this   Breztri Aerosphere 160-9-4.8 MCG/ACT Aero Generic drug: Budeson-Glycopyrrol-Formoterol Inhale 2 puffs into the lungs in the morning and at bedtime. Started by: Coralyn Helling, MD   carboxymethylcellulose 0.5 % Soln Commonly known as: REFRESH PLUS Place 2-4 drops into both eyes See admin instructions. 2-4 drops into both eyes three times a day as needed for dry eye   chlorhexidine 0.12 % solution Commonly known as: PERIDEX Use as directed 15 mLs in the mouth or throat daily as needed (mouth discomfort).   clonazePAM 1 MG tablet Commonly known as: KLONOPIN Take 0.5 tablets (0.5 mg total) by mouth 2 (two) times daily as needed for anxiety. *May take one additional tablet as needed for anxiety/PTSD What changed:  how much to take when to take this additional instructions   fluticasone 50 MCG/ACT nasal spray Commonly known as: FLONASE Place 2  sprays into both nostrils 2 (two) times daily as needed for allergies.   gabapentin 300 MG capsule Commonly known as: NEURONTIN Take 900 mg by mouth See admin instructions. 900 mg twice a day and 600 mg once daily.   loperamide 2 MG capsule Commonly known as: IMODIUM Take 2 mg by mouth 4 (four) times daily as needed for diarrhea or loose stools.   Muro 128 5 % ophthalmic ointment Generic drug: sodium chloride Place 1 application. into both eyes at bedtime.   oxyCODONE 5 MG immediate release tablet Commonly known as: Roxicodone Take 1 tablet (5 mg total) by mouth every 6 (six) hours as needed for severe pain.   oxyCODONE-acetaminophen 10-325 MG tablet Commonly known as: PERCOCET Take 1 tablet 4 times a day by oral route as needed for  30 days.   pyridOXINE 50 MG tablet Commonly known as: VITAMIN B6 Take 50 mg by mouth daily.   Vitamin D3 125 MCG (5000 UT) Caps Take 1 capsule by mouth in the morning and at bedtime.        Signature:  Coralyn Helling, MD [facility] Pulmonary/Critical Care Pager - [phone] 11/12/2022, 2:49 PM

## 2022-11-18 ENCOUNTER — Telehealth: Payer: Self-pay | Admitting: Pulmonary Disease

## 2022-11-18 DIAGNOSIS — J4489 Other specified chronic obstructive pulmonary disease: Secondary | ICD-10-CM

## 2022-11-18 NOTE — Telephone Encounter (Signed)
Patient also needs a new order for nebulizer sent to [facility].  [facility] doesn't have them in stock.  Needs tubing, etc to be included in order.

## 2022-11-19 NOTE — Telephone Encounter (Signed)
Pt aware of appt for sleep study and that it's a over night

## 2022-11-19 NOTE — Telephone Encounter (Signed)
Order for nebulizer signed.  She can use antibiotic and prednisone while on inhaler therapy.

## 2022-11-19 NOTE — Telephone Encounter (Signed)
Pt seen Dr. Craige Cotta in Nov.  Dr. asked if pt had been on prednisone and antibiotic.  Recently prescribed antibiotic from some ankle surgery.  Pt asking if ok to take since she was given Breztri inhaler.  Please advise.  Can leave message.   Dr. Craige Cotta please advise

## 2022-11-20 ENCOUNTER — Ambulatory Visit: Payer: Medicare Other | Admitting: Family Medicine

## 2022-11-20 NOTE — Telephone Encounter (Signed)
ATC patient. Left a detailed message with Dr. Silverio Lay response (ok per phone note). Nothing further needed

## 2022-11-21 ENCOUNTER — Telehealth: Payer: Self-pay | Admitting: Pulmonary Disease

## 2022-11-21 NOTE — Telephone Encounter (Signed)
Called and spoke with patient she is frustrated with [employer] loss adjust[employer] and states her insurance company told her that she could have order sent to [employer]. Patient spoke with rep from [employer] and they are checking into situation. Patient states she will call if she needs anything further from [place]. May need an order sent to a different local DME but will call and let us know. Nothing further needed at this time.

## 2022-12-23 ENCOUNTER — Telehealth: Payer: Self-pay | Admitting: *Deleted

## 2022-12-23 MED ORDER — AZITHROMYCIN 250 MG PO TABS: ORAL_TABLET | ORAL | 0 refills | Status: AC

## 2022-12-23 NOTE — Telephone Encounter (Signed)
Called and spoke with patient. She states she has had a cough since Friday night. It is persistent and sometimes dry but most times it is productive with thick yellow sputum. She states she was up all night coughing. Denies fevers, increased SOB or wheezing.   Patient wants to know if we can call something in for her cough to [facility]. She also wants to know if she should reschedule her In Lab sleep study for tomorrow since she has been awake the last few nights from coughing. I advised her it was a good idea to reschedule if she thought she would not be able to sleep due to her cough but she would like confirmation from Dr. Halford Chessman. Please advise. Thanks!

## 2022-12-23 NOTE — Telephone Encounter (Signed)
Called and gave recs to patient from Dr. Halford Chessman. She voiced understanding. Sent Zpak to [facility] [employer] [facility]. Gave pt sleep center number to reschedule sleep study: [phone]. Nothing further needed

## 2022-12-23 NOTE — Telephone Encounter (Signed)
She should reschedule her in-lab sleep study.  Can send script for Zpak.  She can use OTC mucinex to help with cough and chest congestion.  She should get tested for COVID and Influenza.

## 2022-12-24 ENCOUNTER — Encounter: Payer: Medicare Other | Admitting: Pulmonary Disease

## 2022-12-25 ENCOUNTER — Telehealth: Payer: Self-pay | Admitting: *Deleted

## 2022-12-25 MED ORDER — FLUCONAZOLE 150 MG PO TABS: 150.0000 mg | ORAL_TABLET | Freq: Every day | ORAL | 0 refills | Status: DC

## 2022-12-25 NOTE — Telephone Encounter (Signed)
Called and notified patient and she voiced understanding. Medication sent in. Nothing further needed

## 2022-12-25 NOTE — Telephone Encounter (Signed)
Please send script for fluconazole 150 mg pill x one dose.

## 2022-12-25 NOTE — Telephone Encounter (Signed)
patient recently on antibiotics. Patient states she also has new symptoms of a yeast infection (starting 12/24/22) and would like to see if she can get something for that. Please call and advise patient.

## 2023-01-17 ENCOUNTER — Encounter: Payer: Medicare Other | Admitting: Pulmonary Disease

## 2023-02-07 ENCOUNTER — Ambulatory Visit: Payer: Medicare Other | Admitting: Pulmonary Disease

## 2023-02-21 ENCOUNTER — Encounter: Payer: Self-pay | Admitting: Pulmonary Disease

## 2023-02-21 ENCOUNTER — Ambulatory Visit: Payer: 59 | Attending: Pulmonary Disease | Admitting: Pulmonary Disease

## 2023-02-21 DIAGNOSIS — G4736 Sleep related hypoventilation in conditions classified elsewhere: Secondary | ICD-10-CM | POA: Diagnosis not present

## 2023-02-21 DIAGNOSIS — R0683 Snoring: Secondary | ICD-10-CM | POA: Insufficient documentation

## 2023-02-26 ENCOUNTER — Telehealth: Payer: Self-pay | Admitting: Pulmonary Disease

## 2023-02-26 DIAGNOSIS — J4489 Other specified chronic obstructive pulmonary disease: Secondary | ICD-10-CM

## 2023-02-26 DIAGNOSIS — R0683 Snoring: Secondary | ICD-10-CM

## 2023-02-26 NOTE — Procedures (Signed)
    Patient Name: [name], [name] Date: 02/21/2023 Gender: Female D.O.B: 1955-12-21 Age (years): 57 Referring Provider: Chesley Mires MD, ABSM Height (inches): 60 Interpreting Physician: Chesley Mires MD, ABSM Weight (lbs): 102 RPSGT: Peak, Robert BMI: 20 MRN: [id number]  CLINICAL INFORMATION Sleep Study Type: NPSG  Indication for sleep study: Snoring, sleep disruption, history of COPD.  SLEEP STUDY TECHNIQUE As per the AASM Manual for the Scoring of Sleep and Associated Events v2.3 (April 2016) with a hypopnea requiring 4% desaturations.  The channels recorded and monitored were frontal, central and occipital EEG, electrooculogram (EOG), submentalis EMG (chin), nasal and oral airflow, thoracic and abdominal wall motion, anterior tibialis EMG, snore microphone, electrocardiogram, and pulse oximetry.  MEDICATIONS Medications self-administered by patient taken the night of the study : N/A  SLEEP ARCHITECTURE The study was initiated at 10:26:23 PM and ended at 5:22:40 AM.  Sleep onset time was 28.6 minutes and the sleep efficiency was 60.8%. The total sleep time was 253 minutes.  Stage REM latency was 184.0 minutes.  The patient spent 7.11% of the night in stage N1 sleep, 91.90% in stage N2 sleep, 0.00% in stage N3 and 1% in REM.  Alpha intrusion was absent.  Supine sleep was 0.00%.  RESPIRATORY PARAMETERS The overall apnea/hypopnea index (AHI) was 0.5 per hour. There were 0 total apneas, including 0 obstructive, 0 central and 0 mixed apneas. There were 2 hypopneas and 0 RERAs.  The AHI during Stage REM sleep was 0.0 per hour.  AHI while supine was N/A per hour.  The mean oxygen saturation was 83.38%. The minimum SpO2 during sleep was 79.00%.  moderate snoring was noted during this study.  CARDIAC DATA The 2 lead EKG demonstrated sinus rhythm. The mean heart rate was N/A beats per minute. Other EKG findings include: None.  LEG MOVEMENT DATA The total PLMS were  30 with a resulting PLMS index of 7.11. Associated arousal with leg movement index was 0.0 .  IMPRESSIONS - No significant obstructive sleep apnea occurred during this study (AHI = 0.5/h). - Moderate oxygen desaturation was noted during this study (Min O2 = 79.00%). She spent 253 minutes of sleep time with an SpO2 < 88%.  Supplemental oxygen was not applied during this study. - The patient snored with moderate snoring volume.  DIAGNOSIS - Nocturnal Hypoxemia (G47.36)  RECOMMENDATIONS - She should undergo an overnight oximetry to determine if she qualifies for nocturnal supplemental oxygen. - Avoid alcohol, sedatives and other CNS depressants that may worsen sleep apnea and disrupt normal sleep architecture. - Sleep hygiene should be reviewed to assess factors that may improve sleep quality. - Weight management and regular exercise should be initiated or continued if appropriate.  [Electronically signed] 02/26/2023 11:40 AM  Chesley Mires MD, ABSM Diplomate, American Board of Sleep Medicine NPI: [id number]   [place] PH: [phone]   FX: [phone] [place]

## 2023-02-26 NOTE — Telephone Encounter (Signed)
PSG 02/21/23 >> AHI 0.5, SpO2 low 79%.  Spent 253 min with SpO2 < 88%.   Please let her know that her sleep study didn't show sleep apnea, but did show her oxygen level is low at night.  This is most likely related to her COPD with emphysema.  Please arrange for overnight oximetry with room air to determine if she meets insurance requirements to get set up with supplemental oxygen at night.

## 2023-02-27 NOTE — Telephone Encounter (Signed)
Spoke with patient regarding HST results. They verbalized understanding. She is okay with ONO test being ordered. If she has to have O2 ordered, she wants [place] to add humidity to the O2.   Patient also states she has not heard from [facility] yet. Made her aware I would send a message to NP and Nurses who handle the [facility] program. Patient has an appt today and would like a call either tomorrow or one day next week if possible.   Eric Form NP, Angelina Pih RN and Clarita Leber RN  Please advise. Thank you!

## 2023-02-27 NOTE — Telephone Encounter (Signed)
Left message for pt to call back regarding lung cancer screening referral.

## 2023-02-28 ENCOUNTER — Telehealth (HOSPITAL_BASED_OUTPATIENT_CLINIC_OR_DEPARTMENT_OTHER): Payer: Self-pay | Admitting: Pulmonary Disease

## 2023-02-28 NOTE — Telephone Encounter (Signed)
[facility]. Calling  needing face to face notes from the over night pulse ox. Order [id number]

## 2023-03-07 ENCOUNTER — Telehealth: Payer: Self-pay | Admitting: Pulmonary Disease

## 2023-03-07 NOTE — Telephone Encounter (Signed)
[facility] calling   They need appt notes to auth ONO test for this PT. Pls fax to [phone]

## 2023-03-10 NOTE — Telephone Encounter (Signed)
Printed off sleep study results that wanted the ono. Will fax results from [place]. Nothing further needed

## 2023-03-13 ENCOUNTER — Telehealth: Payer: Self-pay | Admitting: Pulmonary Disease

## 2023-03-13 NOTE — Telephone Encounter (Signed)
[facility] calling need the over night HST pt. Did faxed to them Fax [phone]

## 2023-03-17 ENCOUNTER — Encounter: Payer: Self-pay | Admitting: Pulmonary Disease

## 2023-03-17 ENCOUNTER — Ambulatory Visit (INDEPENDENT_AMBULATORY_CARE_PROVIDER_SITE_OTHER): Payer: 59 | Admitting: Pulmonary Disease

## 2023-03-17 VITALS — BP 124/72 | HR 82 | Ht 60.0 in | Wt 107.6 lb

## 2023-03-17 DIAGNOSIS — Z87891 Personal history of nicotine dependence: Secondary | ICD-10-CM | POA: Diagnosis not present

## 2023-03-17 DIAGNOSIS — J4489 Other specified chronic obstructive pulmonary disease: Secondary | ICD-10-CM

## 2023-03-17 DIAGNOSIS — G4734 Idiopathic sleep related nonobstructive alveolar hypoventilation: Secondary | ICD-10-CM

## 2023-03-17 DIAGNOSIS — J439 Emphysema, unspecified: Secondary | ICD-10-CM | POA: Diagnosis not present

## 2023-03-17 NOTE — Progress Notes (Signed)
[place] [facility], Critical Care, and Sleep Medicine  Chief Complaint  Patient presents with   Follow-up    HST results  Has questions about O2    Past Surgical History:  She  has a past surgical history that includes Cholecystectomy; Eye surgery; Fracture surgery; laparoscopic appendectomy (N/A, 01/08/2019); and ORIF ankle fracture (Right, 04/26/2022).  Past Medical History:  Chronic pain, Depression, Diverticulitis, IBS, Myelomalacia, Panic disorder, PTSD, TMJ  Constitutional:  BP 124/72   Pulse 82   Ht 5' (1.524 m)   Wt 107 lb 9.6 oz (48.8 kg)   SpO2 94% Comment: ra  BMI 21.01 kg/m   Brief Summary:  [name] is a 67 y.o. female smoker with COPD with emphysema.       Subjective:   Sleep study from 02/21/23 showed hypoxia, but no sleep apnea.  Overnight oximetry is still pending.  She has not been scheduled for lung cancer screening program yet.  Breathing better since she switched to breztri.  She is worried about what will happen during Spring/Summer.  She doesn't have air conditioning, and can't open her windows because of the pollen levels.    She is concerned about her husbands breathing issues and would like for him to be seen by pulmonary.  Physical Exam:   Appearance - well kempt   ENMT - no sinus tenderness, no oral exudate, no LAN, Mallampati 2 airway, no stridor  Respiratory - equal breath sounds bilaterally, no wheezing or rales  CV - s1s2 regular rate and rhythm, no murmurs  Ext - no clubbing, no edema  Skin - no rashes  Psych - normal mood and affect    Pulmonary testing:  A1AT 04/26/22 >> 180 PFT 10/08/22 >> FEV1 1.39 (67%), FEV1% 59, TLC 6.90 (154%), DLCO 48%  Chest Imaging:  CT angio chest 04/24/22 >> mild emphysema  Sleep Tests:  PSG 02/21/23 >> AHI 0.5, SpO2 low 79%. Spent 253 min with SpO2 < 88%.   Cardiac Tests:  Echo 04/26/22 >> EF 65 to 70%, grade 1 DD  Social History:  She  reports that she has been smoking  cigarettes. She has a 52.00 pack-year smoking history. She has never used smokeless tobacco. She reports that she does not drink alcohol and does not use drugs.  Family History:  Her family history includes Coronary artery disease in her father; Migraines in her mother; Polymyalgia rheumatica in her mother.     Assessment/Plan:   COPD with emphysema. - has progressive symptoms - will switch from symbicort to breztri - will arrange for albuterol by nebulizer  History of tobacco abuse. - will make sure she gets set up for lung cancer screening program - she will try using nicotine patch to help with smoking cessation  Nocturnal hypoxemia. - seen during sleep study - she will need overnight oximetry on room air at home prior to qualifying for home oxygen set up  Cervical myelopathy. - followed by neurology at [place] ankle fracture with chronic pain. - followed at [facility]  Time Spent Involved in Patient Care on Day of Examination:  37 minutes  Follow up:   Patient Instructions  Will make sure your overnight oxygen test is schedule and that you are set up for the lung cancer screening program  Follow up in 6 month  Medication List:   Allergies as of 03/17/2023       Reactions   Amoxicillin-pot Clavulanate Other (See Comments)   GI upset   Naltrexone  Other (See Comments)   Hallucinations, sleepiness, withdrawal s/s, poor apepitie   Nsaids    Advised not to take    Toradol [ketorolac Tromethamine] Nausea And Vomiting, Swelling   TONGUE SWELLING   Tramadol Nausea And Vomiting   Atorvastatin Other (See Comments), Itching, Nausea Only, Tinitus   Benadryl [diphenhydramine Hcl] Palpitations   Insomnia   Ceftin [cefuroxime Axetil] Rash   Compazine Palpitations   Insomnia   Epinephrine Palpitations   Insomina        Medication List        Accurate as of March 17, 2023  3:26 PM. If you have any questions, ask your nurse or doctor.          STOP taking  these medications    fluconazole 150 MG tablet Commonly known as: DIFLUCAN Stopped by: Chesley Mires, MD       TAKE these medications    acetaminophen 500 MG tablet Commonly known as: TYLENOL Take 1,000 mg by mouth 4 (four) times daily as needed (pain).   albuterol (2.5 MG/3ML) 0.083% nebulizer solution Commonly known as: PROVENTIL Take 3 mLs (2.5 mg total) by nebulization every 6 (six) hours as needed for wheezing or shortness of breath.   Azelastine HCl 137 MCG/SPRAY Soln Place 1 spray into the nose daily as needed (for nasal congestion).   baclofen 10 MG tablet Commonly known as: LIORESAL Take 0.5 tablets (5 mg total) by mouth 2 (two) times daily. What changed:  how much to take when to take this   Breztri Aerosphere 160-9-4.8 MCG/ACT Aero Generic drug: Budeson-Glycopyrrol-Formoterol Inhale 2 puffs into the lungs in the morning and at bedtime.   carboxymethylcellulose 0.5 % Soln Commonly known as: REFRESH PLUS Place 2-4 drops into both eyes See admin instructions. 2-4 drops into both eyes three times a day as needed for dry eye   chlorhexidine 0.12 % solution Commonly known as: PERIDEX Use as directed 15 mLs in the mouth or throat daily as needed (mouth discomfort).   clonazePAM 1 MG tablet Commonly known as: KLONOPIN Take 0.5 tablets (0.5 mg total) by mouth 2 (two) times daily as needed for anxiety. *May take one additional tablet as needed for anxiety/PTSD What changed:  how much to take when to take this additional instructions   fluticasone 50 MCG/ACT nasal spray Commonly known as: FLONASE Place 2 sprays into both nostrils 2 (two) times daily as needed for allergies.   gabapentin 300 MG capsule Commonly known as: NEURONTIN Take 900 mg by mouth See admin instructions. 900 mg twice a day and 600 mg once daily.   loperamide 2 MG capsule Commonly known as: IMODIUM Take 2 mg by mouth 4 (four) times daily as needed for diarrhea or loose stools.   Muro 128 5  % ophthalmic ointment Generic drug: sodium chloride Place 1 application. into both eyes at bedtime.   oxyCODONE 5 MG immediate release tablet Commonly known as: Roxicodone Take 1 tablet (5 mg total) by mouth every 6 (six) hours as needed for severe pain.   oxyCODONE-acetaminophen 10-325 MG tablet Commonly known as: PERCOCET Take 1 tablet 4 times a day by oral route as needed for 30 days.   pyridOXINE 50 MG tablet Commonly known as: VITAMIN B6 Take 50 mg by mouth daily.   Vitamin D3 125 MCG (5000 UT) capsule Generic drug: Cholecalciferol Take 1 capsule by mouth in the morning and at bedtime.        Signature:  Chesley Mires, MD [place] Pager - [phone]  [phone] 03/17/2023, 3:26 PM

## 2023-03-17 NOTE — Patient Instructions (Signed)
Will make sure your overnight oxygen test is schedule and that you are set up for the lung cancer screening program  Follow up in 6 month

## 2023-03-17 NOTE — Telephone Encounter (Signed)
Notes have been re-faxed.

## 2023-03-24 ENCOUNTER — Telehealth: Payer: Self-pay | Admitting: *Deleted

## 2023-03-24 NOTE — Telephone Encounter (Signed)
Called and spoke w/ pts husband (ok per DPR) - informed him that I have faxed over the sleeo study to [place].

## 2023-03-24 NOTE — Telephone Encounter (Signed)
Patient called and states that [facility] still has not received results from sleep study and would like to discuss what to do next.  See encounter from 03/13/23

## 2023-04-28 ENCOUNTER — Ambulatory Visit (HOSPITAL_BASED_OUTPATIENT_CLINIC_OR_DEPARTMENT_OTHER): Payer: 59 | Admitting: Pulmonary Disease

## 2023-06-05 ENCOUNTER — Other Ambulatory Visit: Payer: Self-pay | Admitting: Pulmonary Disease

## 2023-06-09 ENCOUNTER — Telehealth: Payer: Self-pay | Admitting: Pulmonary Disease

## 2023-06-09 DIAGNOSIS — G4734 Idiopathic sleep related nonobstructive alveolar hypoventilation: Secondary | ICD-10-CM

## 2023-06-09 NOTE — Telephone Encounter (Signed)
PT calling because [facility] told her theO2 level was very low and they ( [facility]) sent a fax in to Dr. Craige Cotta reporting this.  This order only good for 30 days. Please call PT to advise. [phone].  If she does not answer (has broken ankle) please leave message.

## 2023-06-10 NOTE — Telephone Encounter (Signed)
Spoke with the pt  She states that she needs to get ONO results  She was told by Burna Mortimer at [facility] that her ONO was pos  I called and spoke with Burna Mortimer and she is to email the results to me and I will forward to Dr Craige Cotta to be reviewed  Will await results

## 2023-06-10 NOTE — Telephone Encounter (Signed)
Called and spoke with patient. She verbalized understanding of results. Order for oxygen has been placed.   Nothing further needed at time of call.

## 2023-06-10 NOTE — Telephone Encounter (Signed)
Patient calling back stating [facility] still needs an order regarding oxygen at night. Please call and advise patient. [phone]

## 2023-06-10 NOTE — Telephone Encounter (Signed)
Dr Craige Cotta- please look at the email I forwarded you with this pt's ONO results, thanks

## 2023-06-10 NOTE — Telephone Encounter (Signed)
ONO with RA 05/27/23 >> test time 10 hrs 11 min.  Mean SpO2 70%, low SpO2 59%.  Spent 10 hrs 11 min with an SpO2 < 88%.   Please let her know that her oxygen stays low while she is asleep.  Please arrange for 3 liters oxygen at night.

## 2023-06-13 ENCOUNTER — Other Ambulatory Visit: Payer: Self-pay | Admitting: Emergency Medicine

## 2023-06-13 DIAGNOSIS — Z87891 Personal history of nicotine dependence: Secondary | ICD-10-CM

## 2023-06-13 DIAGNOSIS — Z122 Encounter for screening for malignant neoplasm of respiratory organs: Secondary | ICD-10-CM

## 2023-06-13 DIAGNOSIS — F17210 Nicotine dependence, cigarettes, uncomplicated: Secondary | ICD-10-CM

## 2023-06-18 ENCOUNTER — Telehealth: Payer: Self-pay | Admitting: Pulmonary Disease

## 2023-06-18 NOTE — Telephone Encounter (Signed)
Patient states oxygen has not been delivered to home. Patient phone number is [phone].

## 2023-06-23 NOTE — Telephone Encounter (Signed)
ATC patient. Unable to leave vm due to vm not being set up.. will try to call back again.

## 2023-06-25 NOTE — Telephone Encounter (Signed)
Pt calling back in regards to getting oxygen delivered. Was told today is the very last day

## 2023-06-27 NOTE — Telephone Encounter (Signed)
This would need to go to the [facility] team.  Order was placed on 06/10/2023.  Thank you.

## 2023-06-30 NOTE — Telephone Encounter (Signed)
Called [address] and spoke to Amity - she verified O2 was delivered on 7/10. Nothing further needed.

## 2023-07-29 ENCOUNTER — Encounter: Payer: Self-pay | Admitting: Acute Care

## 2023-07-29 ENCOUNTER — Ambulatory Visit: Payer: 59 | Admitting: Acute Care

## 2023-07-29 DIAGNOSIS — F17210 Nicotine dependence, cigarettes, uncomplicated: Secondary | ICD-10-CM | POA: Diagnosis not present

## 2023-07-29 NOTE — Progress Notes (Signed)
Reviewed and agree with assessment/plan.   Coralyn Helling, MD [facility] Pulmonary/Critical Care 07/29/2023, 3:53 PM Pager:  [phone]

## 2023-07-29 NOTE — Patient Instructions (Signed)
 Thank you for participating in the [facility] Screening Program. It was our pleasure to meet you today. We will call you with the results of your scan within the next few days. Your scan will be assigned a Lung RADS category score by the physicians reading the scans.  This Lung RADS score determines follow up scanning.  See below for description of categories, and follow up screening recommendations. We will be in touch to schedule your follow up screening annually or based on recommendations of our providers. We will fax a copy of your scan results to your Primary Care Physician, or the physician who referred you to the program, to ensure they have the results. Please call the office if you have any questions or concerns regarding your scanning experience or results.  Our office number is [phone]. Please speak with Abigail Miyamoto, RN. , or  Karlton Lemon RN, They are  our Lung Cancer Screening RN.'s If They are unavailable when you call, Please leave a message on the voice mail. We will return your call at our earliest convenience.This voice mail is monitored several times a day.  Remember, if your scan is normal, we will scan you annually as long as you continue to meet the criteria for the program. (Age 67-80, Current smoker or smoker who has quit within the last 15 years). If you are a smoker, remember, quitting is the single most powerful action that you can take to decrease your risk of lung cancer and other pulmonary, breathing related problems. We know quitting is hard, and we are here to help.  Please let us know if there is anything we can do to help you meet your goal of quitting. If you are a former smoker, Counselling psychologist. We are proud of you! Remain smoke free! Remember you can refer friends or family members through the number above.  We will screen them to make sure they meet criteria for the program. Thank you for helping [place] take better care of you by  participating in Lung Screening.   Lung RADS Categories:  Lung RADS 1: no nodules or definitely non-concerning nodules.  Recommendation is for a repeat annual scan in 12 months.  Lung RADS 2:  nodules that are non-concerning in appearance and behavior with a very low likelihood of becoming an active cancer. Recommendation is for a repeat annual scan in 12 months.  Lung RADS 3: nodules that are probably non-concerning , includes nodules with a low likelihood of becoming an active cancer.  Recommendation is for a 32-month repeat screening scan. Often noted after an upper respiratory illness. We will be in touch to make sure you have no questions, and to schedule your 31-month scan.  Lung RADS 4 A: nodules with concerning findings, recommendation is most often for a follow up scan in 3 months or additional testing based on our provider's assessment of the scan. We will be in touch to make sure you have no questions and to schedule the recommended 3 month follow up scan.  Lung RADS 4 B:  indicates findings that are concerning. We will be in touch with you to schedule additional diagnostic testing based on our provider's  assessment of the scan.  You can receive free nicotine replacement therapy ( patches, gum or mints) by calling 1-800-QUIT NOW. Please call so we can get you on the path to becoming  a non-smoker. I know it is hard, but you can do this!  Other options for assistance in smoking  cessation ( As covered by your insurance benefits)  Hypnosis for smoking cessation  [employer] [phone]  Acupuncture for smoking cessation  [employer] [phone]

## 2023-07-29 NOTE — Progress Notes (Signed)
Virtual Visit via Telephone Note  I connected with DRUANN STOLZMAN on 07/29/23 at  1:30 PM EDT by telephone and verified that I am speaking with the correct person using two identifiers.  Location: Patient:  At home Provider: [address], [place], [place], [address]    I discussed the limitations, risks, security and privacy concerns of performing an evaluation and management service by telephone and the availability of in person appointments. I also discussed with the patient that there may be a patient responsible charge related to this service. The patient expressed understanding and agreed to proceed.   Shared Decision Making Visit Lung Cancer Screening Program [phone])   Eligibility: Age 67 y.o. Pack Years Smoking History Calculation 49 pack years  (# packs/per year x # years smoked) Recent History of coughing up blood  no Unexplained weight loss? no ( >Than 15 pounds within the last 6 months ) Prior History Lung / other cancer no (Diagnosis within the last 5 years already requiring surveillance chest CT Scans). Smoking Status Current Smoker Former Smokers: Years since quit:  NA  Quit Date:  NA  Visit Components: Discussion included one or more decision making aids. yes Discussion included risk/benefits of screening. yes Discussion included potential follow up diagnostic testing for abnormal scans. yes Discussion included meaning and risk of over diagnosis. yes Discussion included meaning and risk of False Positives. yes Discussion included meaning of total radiation exposure. yes  Counseling Included: Importance of adherence to annual lung cancer LDCT screening. yes Impact of comorbidities on ability to participate in the program. yes Ability and willingness to under diagnostic treatment. yes  Smoking Cessation Counseling: Current Smokers:  Discussed importance of smoking cessation. yes Information about tobacco cessation classes and interventions provided to  patient. yes Patient provided with "ticket" for LDCT Scan. yes Symptomatic Patient. no  Counseling NA Diagnosis Code: Tobacco Use Z72.0 Asymptomatic Patient yes  Counseling (Intermediate counseling: > three minutes counseling) V2536 Former Smokers:  Discussed the importance of maintaining cigarette abstinence. yes Diagnosis Code: Personal History of Nicotine Dependence. U44.034 Information about tobacco cessation classes and interventions provided to patient. Yes Patient provided with "ticket" for LDCT Scan. yes Written Order for Lung Cancer Screening with LDCT placed in Epic. Yes (CT Chest Lung Cancer Screening Low Dose W/O CM) [id number] Z12.2-Screening of respiratory organs Z87.891-Personal history of nicotine dependence  I have spent 25 minutes of face to face/ virtual visit   time with  Ms. Smack discussing the risks and benefits of lung cancer screening. We viewed / discussed a power point together that explained in detail the above noted topics. We paused at intervals to allow for questions to be asked and answered to ensure understanding.We discussed that the single most powerful action that she can take to decrease her risk of developing lung cancer is to quit smoking. We discussed whether or not she is ready to commit to setting a quit date. We discussed options for tools to aid in quitting smoking including nicotine replacement therapy, non-nicotine medications, support groups, Quit Smart classes, and behavior modification. We discussed that often times setting smaller, more achievable goals, such as eliminating 1 cigarette a day for a week and then 2 cigarettes a day for a week can be helpful in slowly decreasing the number of cigarettes smoked. This allows for a sense of accomplishment as well as providing a clinical benefit. I provided  her  with smoking cessation  information  with contact information for community resources, classes, free  nicotine replacement therapy, and access to  mobile apps, text messaging, and on-line smoking cessation help. I have also provided  her  the office contact information in the event she needs to contact me, or the screening staff. We discussed the time and location of the scan, and that either Abigail Miyamoto RN, Karlton Lemon, RN  or I will call / send a letter with the results within 24-72 hours of receiving them. The patient verbalized understanding of all of  the above and had no further questions upon leaving the office. They have my contact information in the event they have any further questions.  I spent 3-4 minutes counseling on smoking cessation and the health risks of continued tobacco abuse.  I explained to the patient that there has been a high incidence of coronary artery disease noted on these exams. I explained that this is a non-gated exam therefore degree or severity cannot be determined. This patient is not on statin therapy. I have asked the patient to follow-up with their PCP regarding any incidental finding of coronary artery disease and management with diet or medication as their PCP  feels is clinically indicated. The patient verbalized understanding of the above and had no further questions upon completion of the visit.      Bevelyn Ngo, NP 07/29/2023

## 2023-08-04 ENCOUNTER — Encounter (HOSPITAL_COMMUNITY): Payer: Self-pay | Admitting: Orthopaedic Surgery

## 2023-08-04 DIAGNOSIS — M79605 Pain in left leg: Secondary | ICD-10-CM

## 2023-08-04 DIAGNOSIS — M79604 Pain in right leg: Secondary | ICD-10-CM

## 2023-08-05 ENCOUNTER — Other Ambulatory Visit (HOSPITAL_COMMUNITY): Payer: Self-pay | Admitting: Orthopaedic Surgery

## 2023-08-05 ENCOUNTER — Ambulatory Visit (HOSPITAL_COMMUNITY)
Admission: RE | Admit: 2023-08-05 | Discharge: 2023-08-05 | Disposition: A | Discharge status: Home / Self Care | Payer: 59 | Source: Ambulatory Visit | Attending: Orthopaedic Surgery | Admitting: Orthopaedic Surgery

## 2023-08-05 DIAGNOSIS — Z4889 Encounter for other specified surgical aftercare: Secondary | ICD-10-CM

## 2023-08-12 ENCOUNTER — Ambulatory Visit (HOSPITAL_COMMUNITY)
Admission: RE | Admit: 2023-08-12 | Discharge: 2023-08-12 | Disposition: A | Discharge status: Home / Self Care | Payer: 59 | Source: Ambulatory Visit | Attending: Acute Care | Admitting: Acute Care

## 2023-08-12 DIAGNOSIS — F17210 Nicotine dependence, cigarettes, uncomplicated: Secondary | ICD-10-CM | POA: Insufficient documentation

## 2023-08-12 DIAGNOSIS — Z122 Encounter for screening for malignant neoplasm of respiratory organs: Secondary | ICD-10-CM | POA: Insufficient documentation

## 2023-08-12 DIAGNOSIS — Z87891 Personal history of nicotine dependence: Secondary | ICD-10-CM | POA: Diagnosis present

## 2023-08-20 ENCOUNTER — Other Ambulatory Visit: Payer: Self-pay

## 2023-08-20 DIAGNOSIS — Z87891 Personal history of nicotine dependence: Secondary | ICD-10-CM

## 2023-08-20 DIAGNOSIS — Z122 Encounter for screening for malignant neoplasm of respiratory organs: Secondary | ICD-10-CM

## 2023-08-20 DIAGNOSIS — F17210 Nicotine dependence, cigarettes, uncomplicated: Secondary | ICD-10-CM

## 2023-08-28 IMAGING — DX DG ANKLE 2V *R*
2 series · 2 of 2 positions shown · non-contrast
Comparison: X-ray intraop 04/26/2022, CT ankle 04/19/2022

CLINICAL DATA: fall

EXAM:
RIGHT ANKLE - 2 VIEW

[tibia ap]
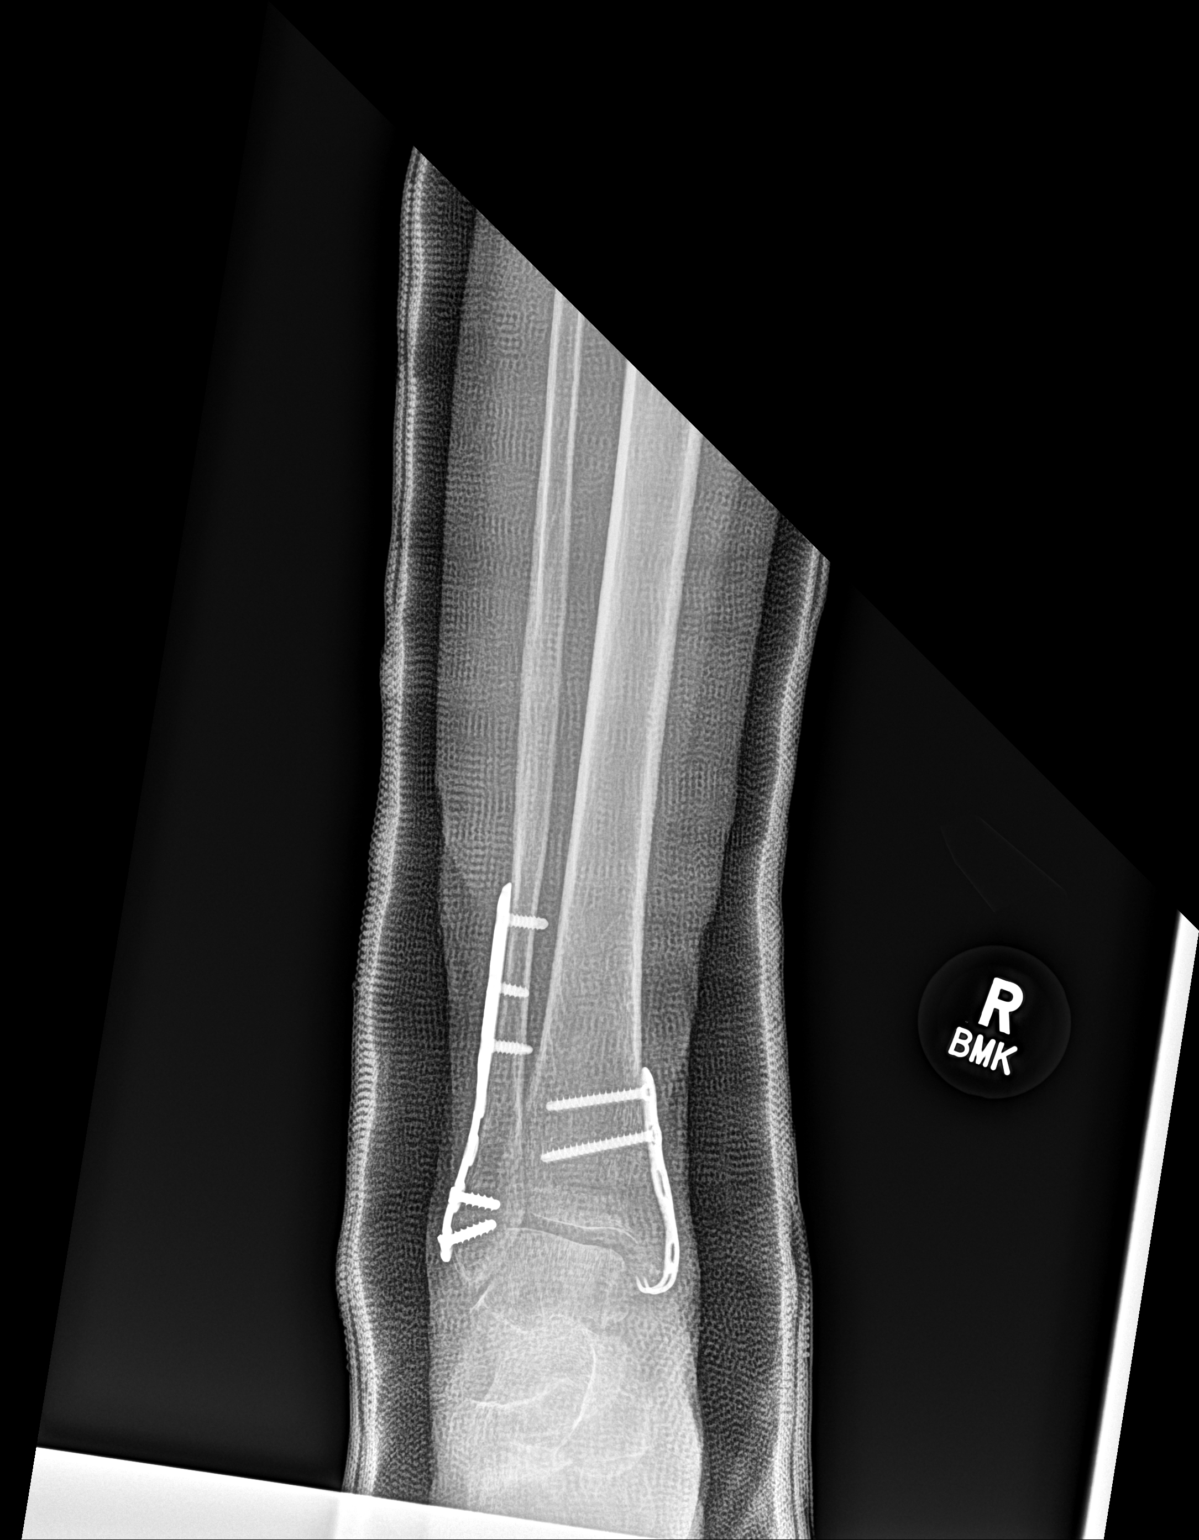

[tibia lat]
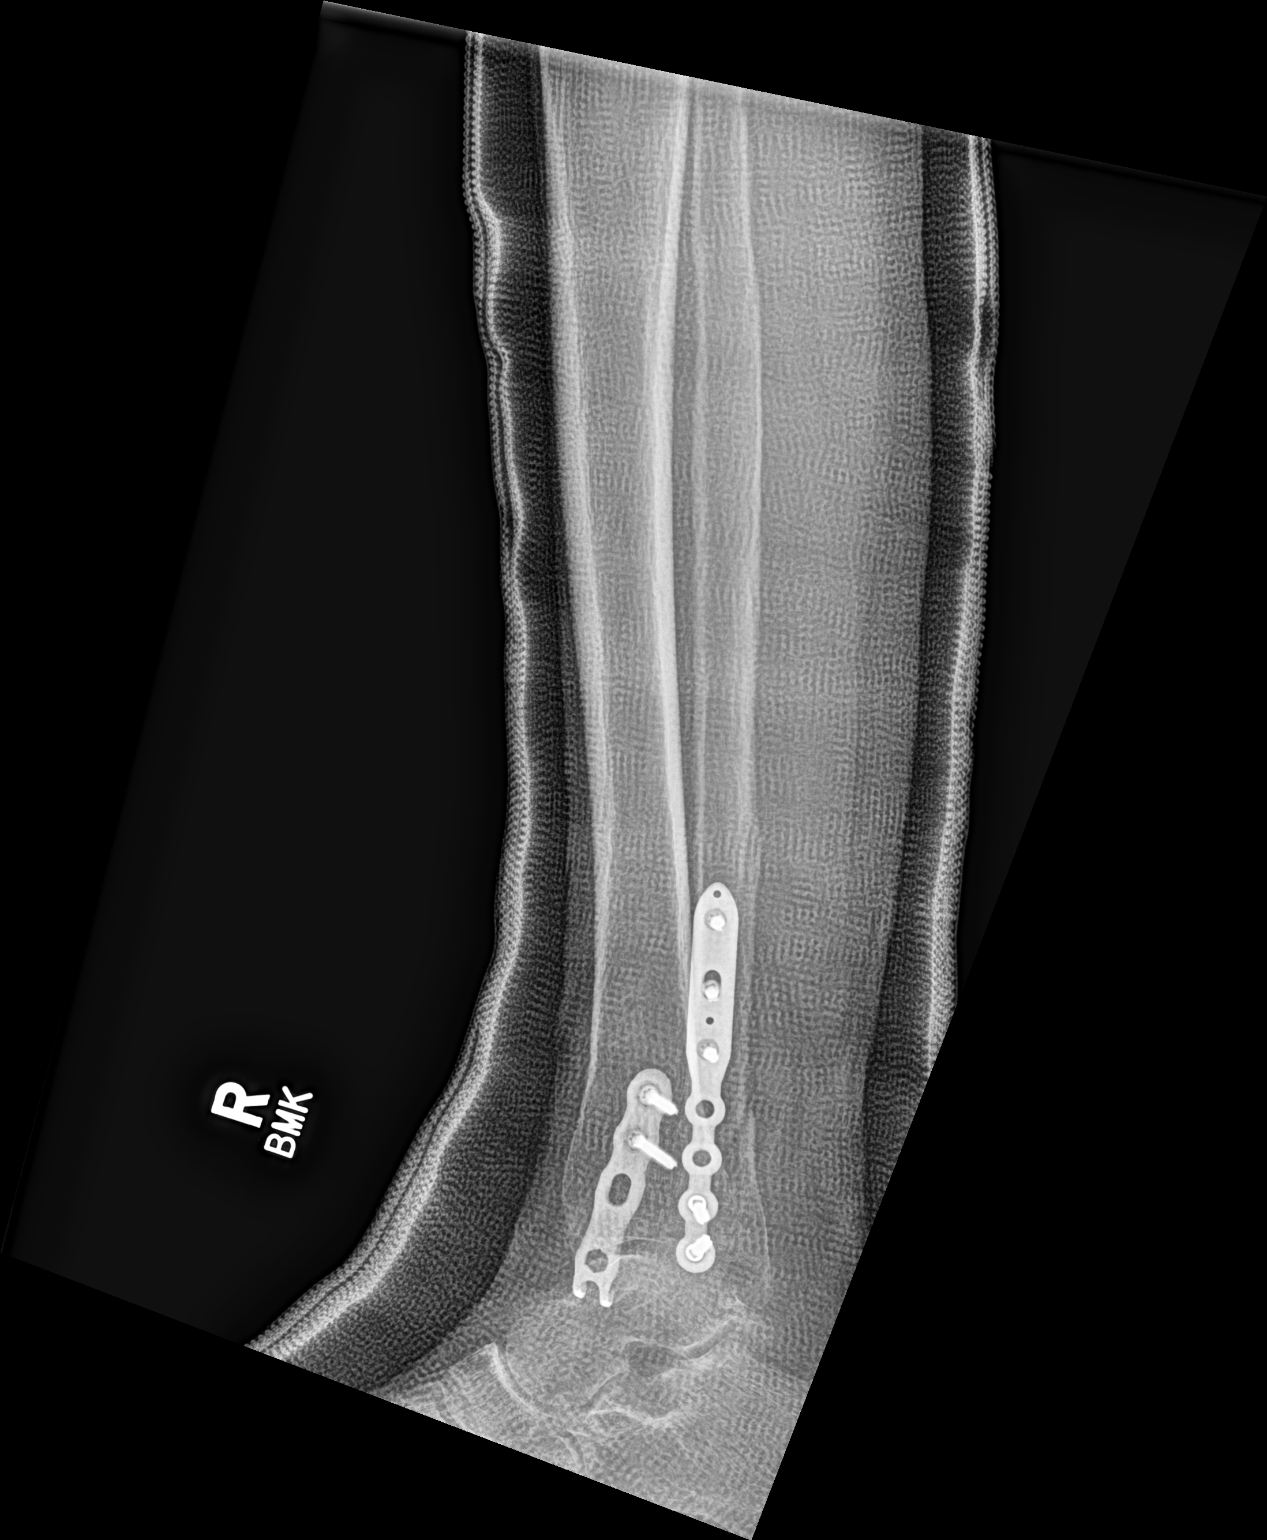

[2 of 2 positions shown; findings below may reference images not displayed]

FINDINGS: Overlying cast. Plate and screw fixation of distal fibula and medial
malleolus. Redemonstration of a minimally displaced subacute
posterior malleolar fracture. There is no evidence of acute
displaced fracture, dislocation, or joint effusion. There is no
evidence of arthropathy or other focal bone abnormality. Soft
tissues are unremarkable.
IMPRESSION: 1. No acute displaced fracture or dislocation.
2. Status post plate and screw fixation of a subacute distal fibular
and medial malleolar fracture. Redemonstration of a subacute
posterior malleolar fracture. Limited evaluation for healing due to
overlying cast.

## 2023-08-28 IMAGING — DX DG HIP (WITH OR WITHOUT PELVIS) 2-3V*R*
3 series · 3 of 3 positions shown · non-contrast
Comparison: None Available.

CLINICAL DATA: fall

EXAM:
DG HIP (WITH OR WITHOUT PELVIS) 2-3V RIGHT

[pelvis ap (1 of 2)]
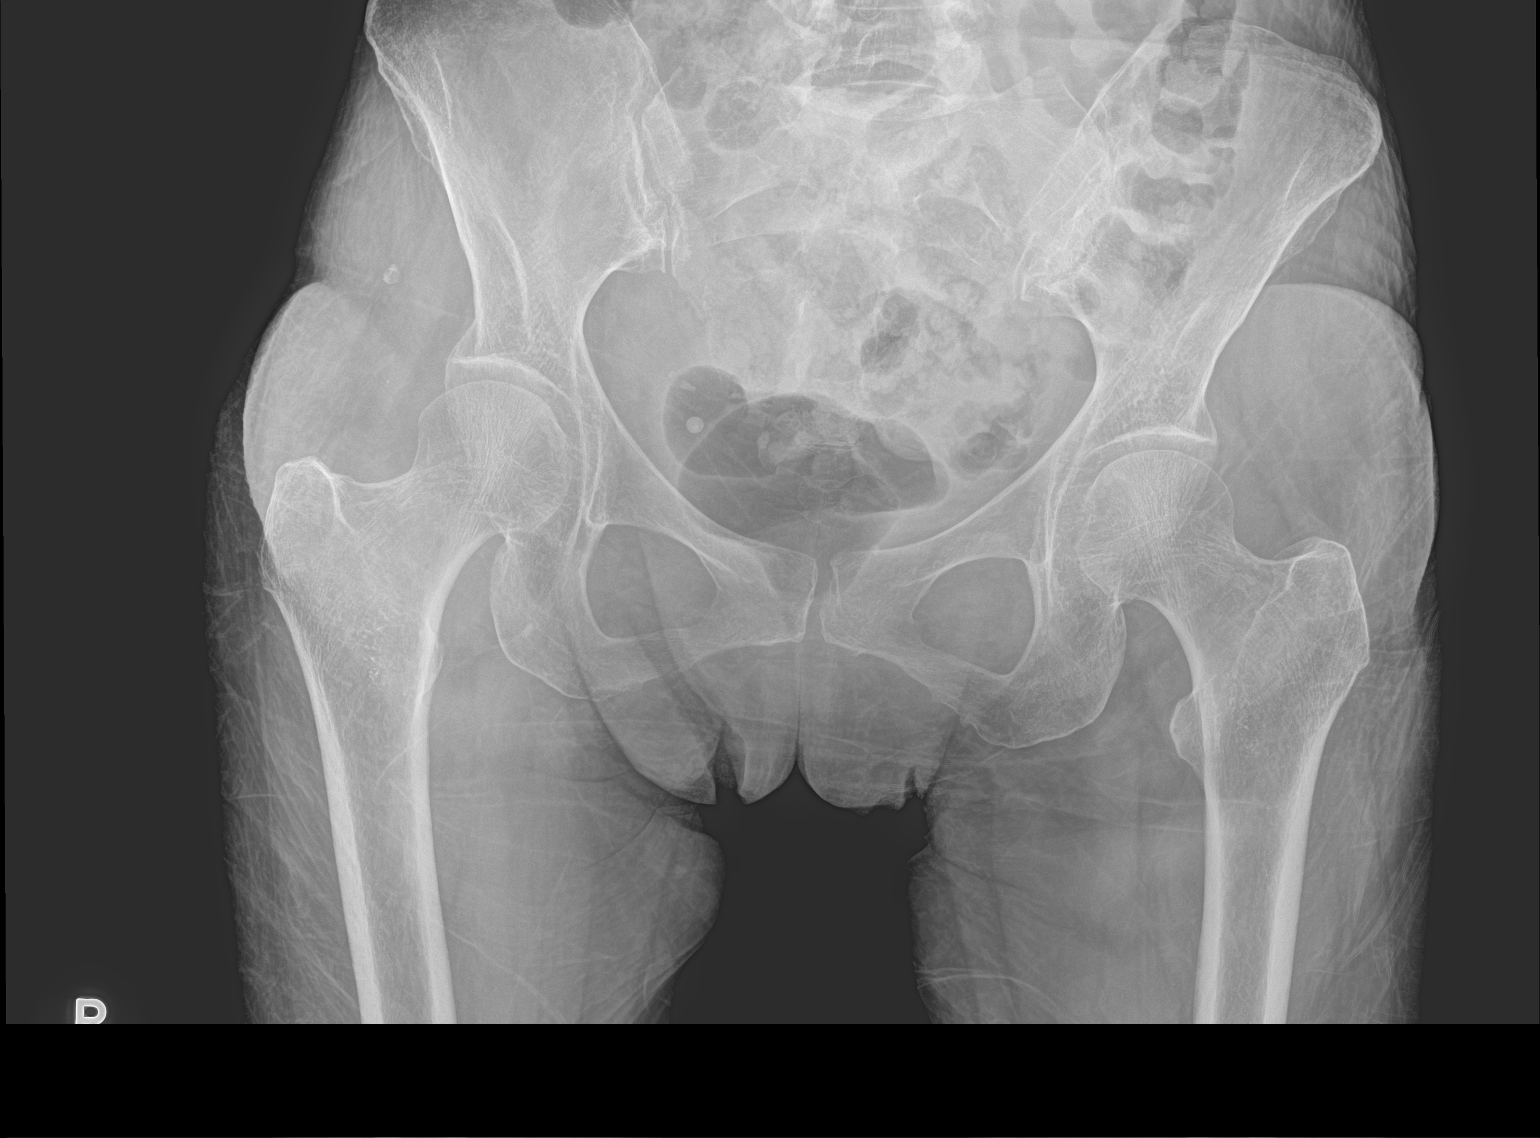

[pelvis ap (2 of 2)]
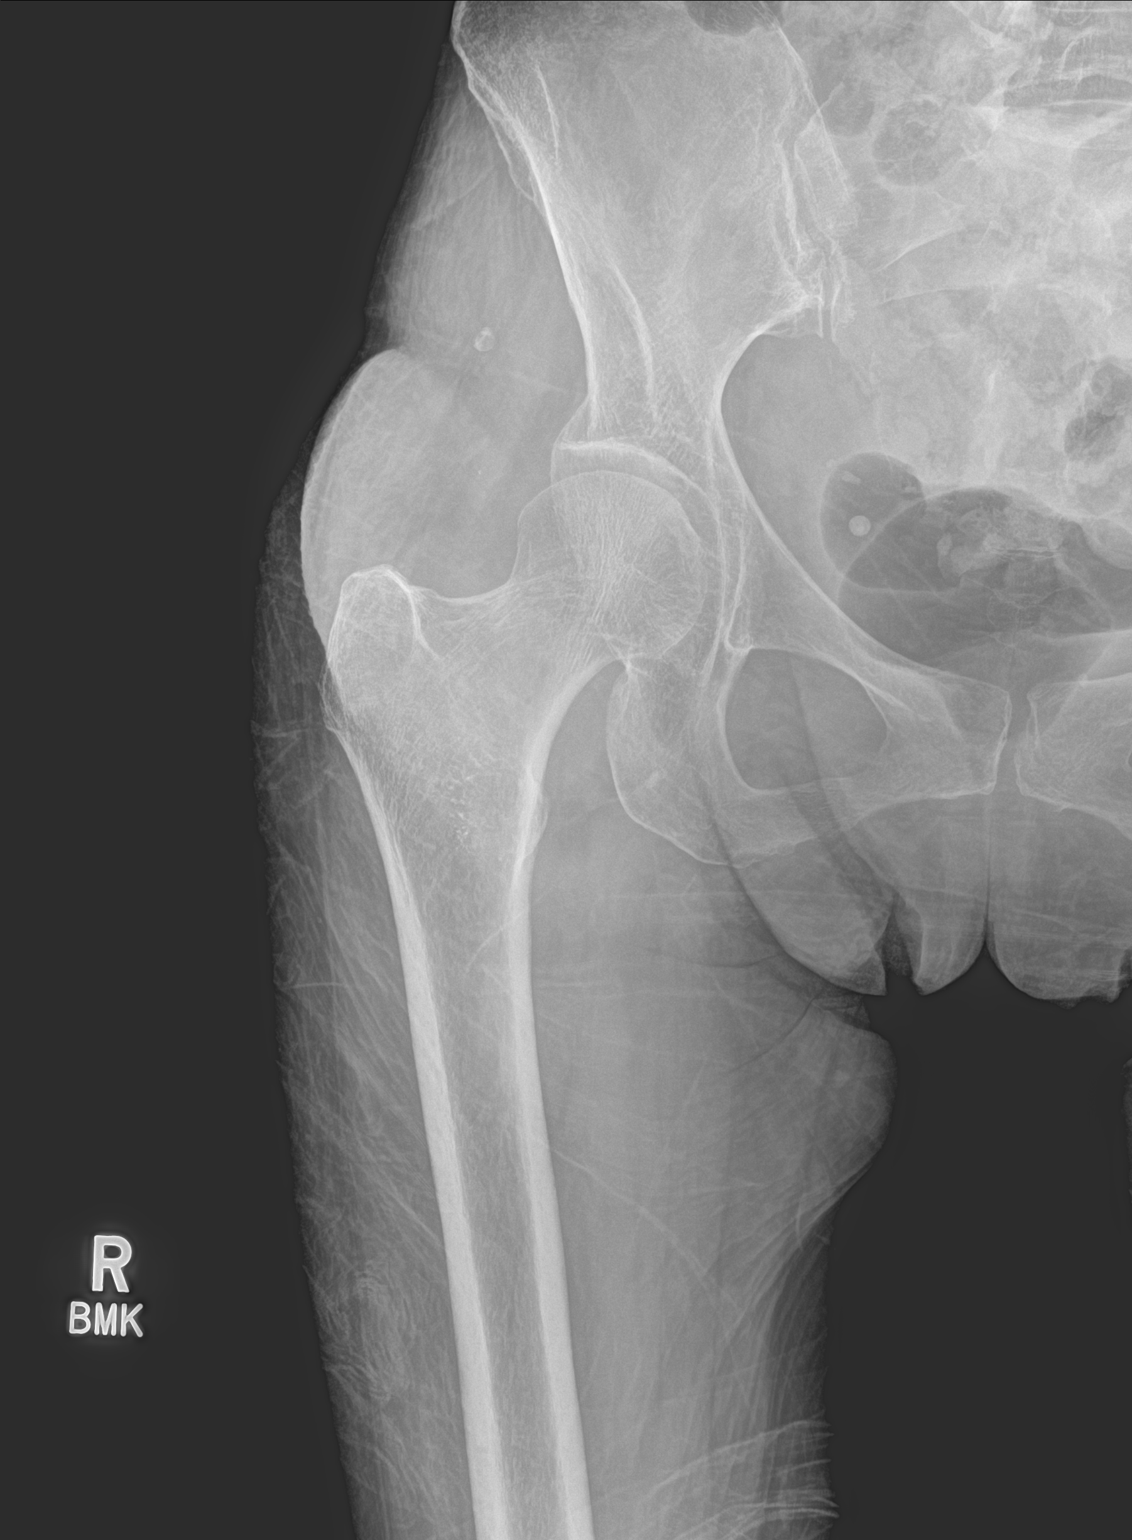

[hip lat]
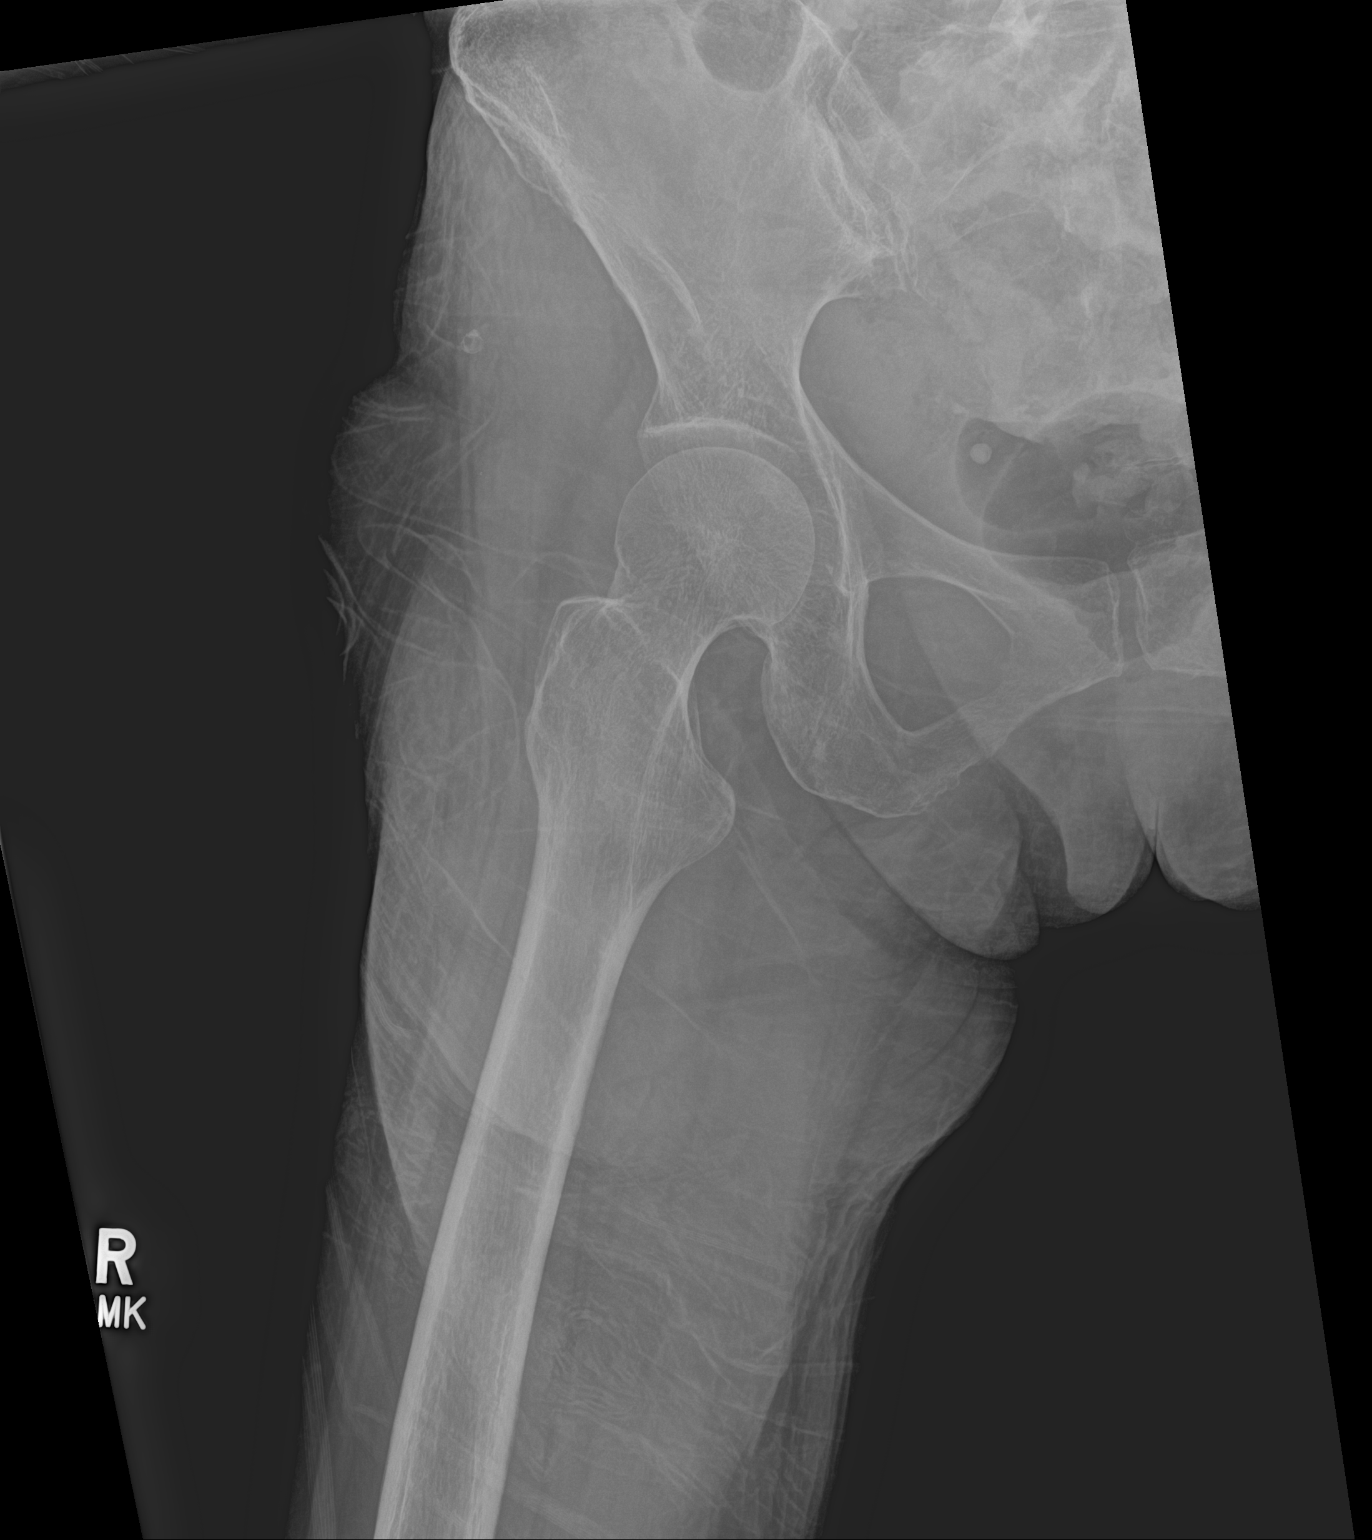

[3 of 3 positions shown; findings below may reference images not displayed]

FINDINGS: There is no evidence of hip fracture or dislocation of the right
hip. No acute displaced fracture or diastasis of the bones pelvis.
Frontal view of the left hip grossly unremarkable. Limited
evaluation of the sacrum- overlapping osseous structures and
overlying soft tissues. there is no evidence of arthropathy or other
focal bone abnormality.
IMPRESSION: Negative for acute traumatic injury.

## 2023-08-28 IMAGING — DX DG CLAVICLE*R*
2 series · 2 of 2 positions shown · non-contrast
Comparison: None Available.

CLINICAL DATA: Pain after fall

EXAM:
RIGHT CLAVICLE - 2+ VIEWS

[clavicle ap]
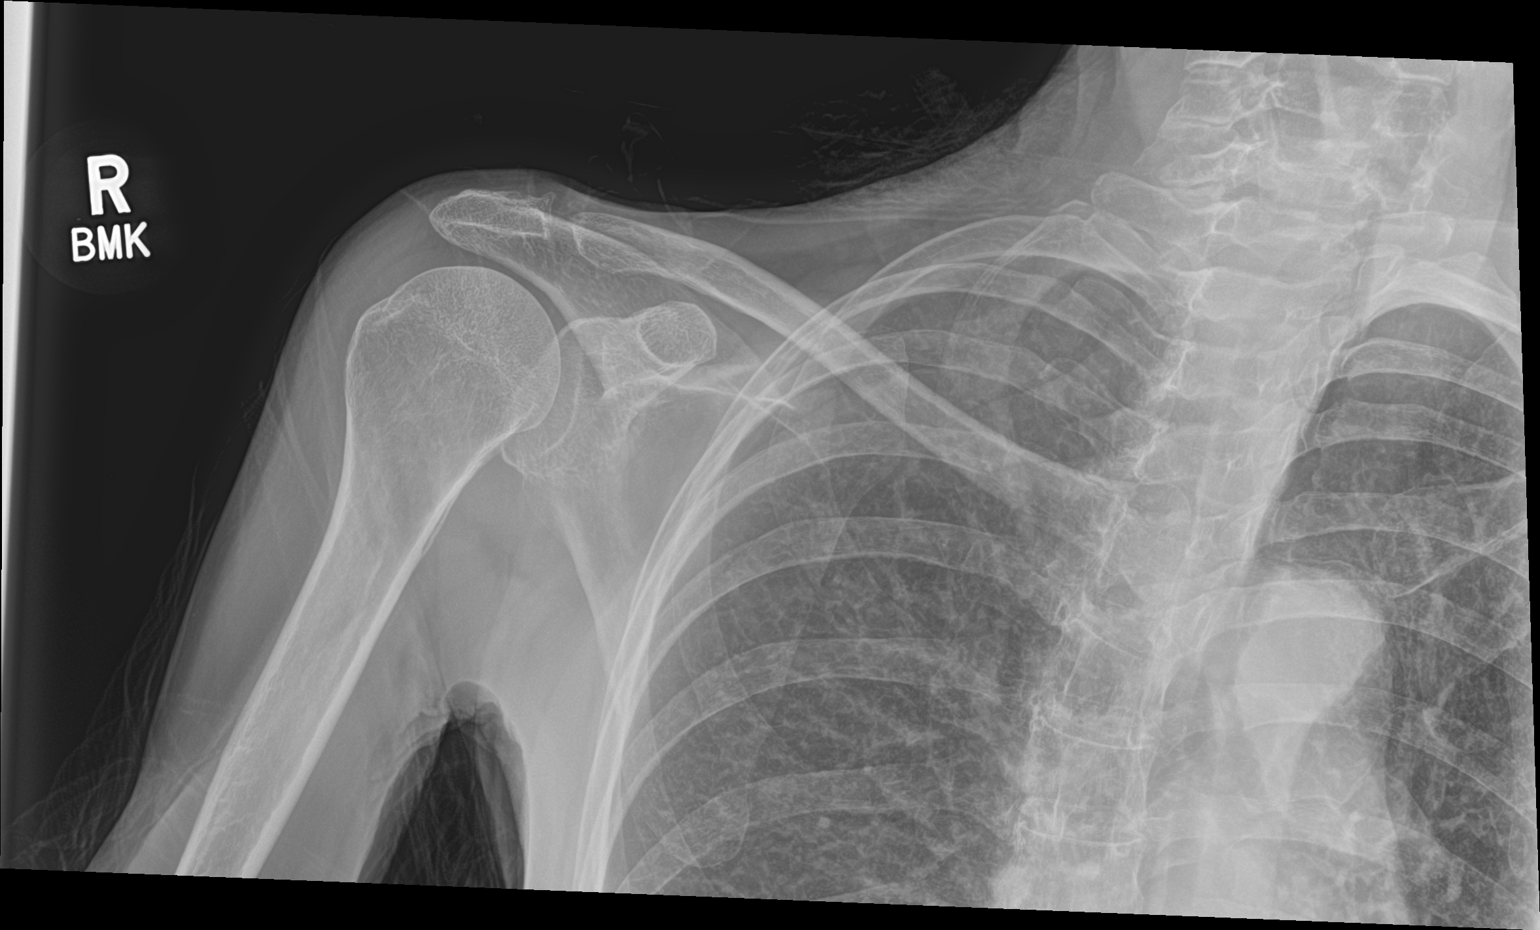

[clavicle axial]
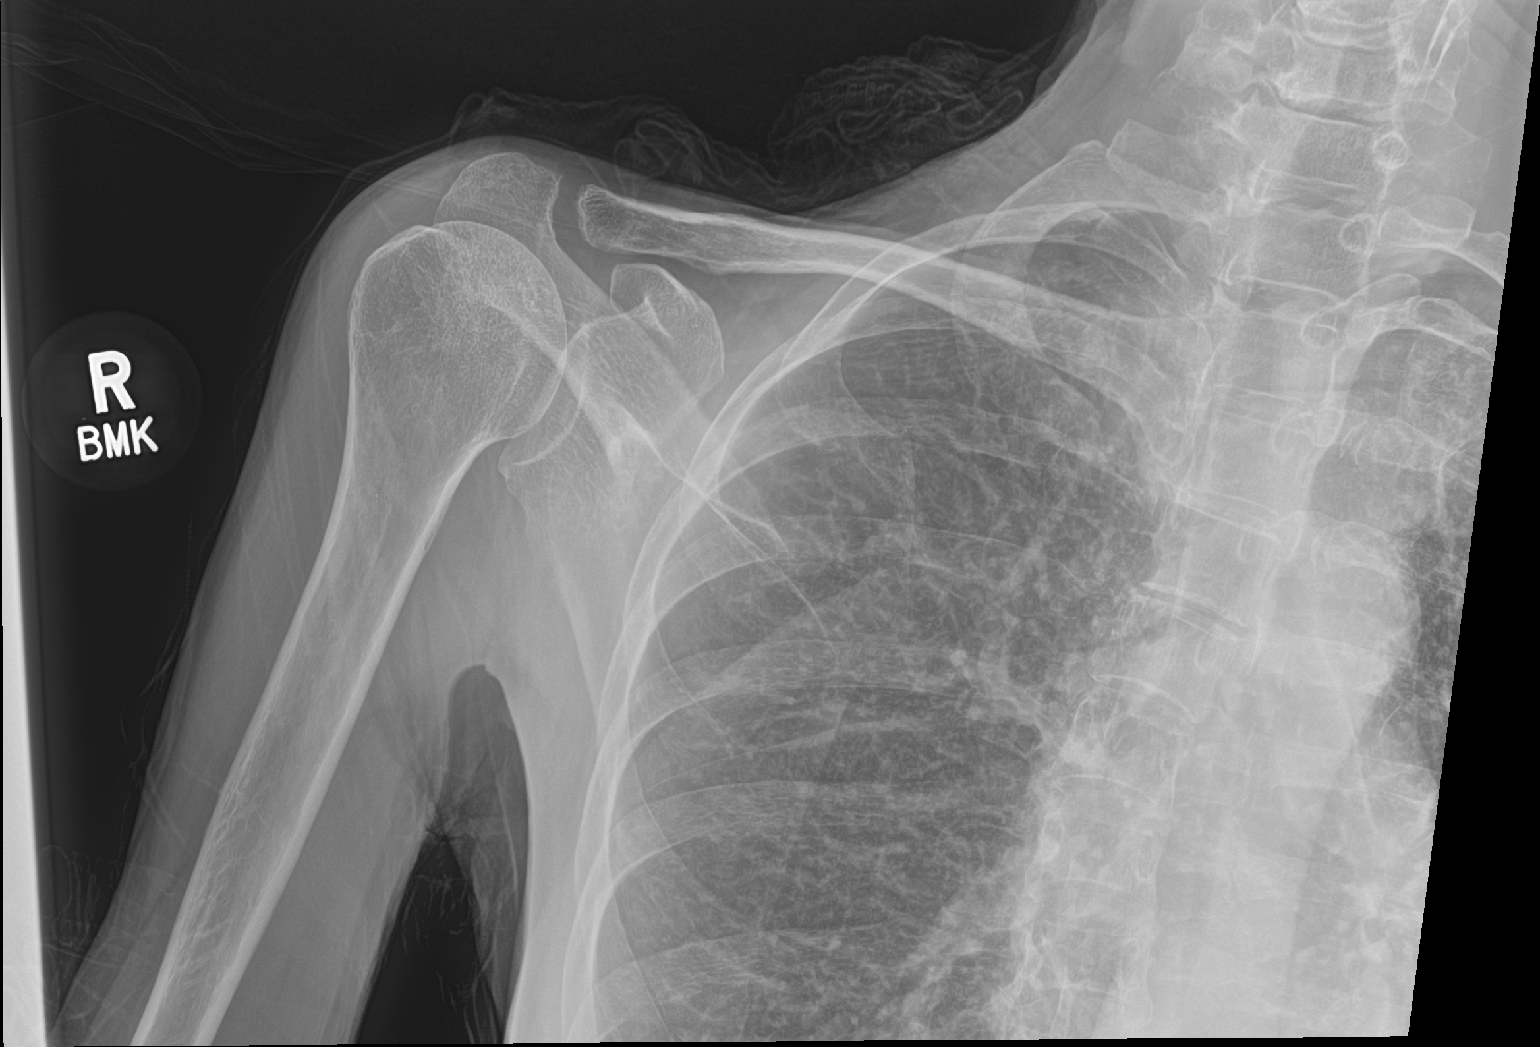

[2 of 2 positions shown; findings below may reference images not displayed]

FINDINGS: There is no evidence of fracture or other focal bone lesions. Soft
tissues are unremarkable.
IMPRESSION: Negative.

## 2023-09-15 ENCOUNTER — Ambulatory Visit (INDEPENDENT_AMBULATORY_CARE_PROVIDER_SITE_OTHER): Payer: 59 | Admitting: Pulmonary Disease

## 2023-09-15 ENCOUNTER — Encounter: Payer: Self-pay | Admitting: Pulmonary Disease

## 2023-09-15 VITALS — BP 104/70 | HR 80 | Ht 60.0 in | Wt 108.4 lb

## 2023-09-15 DIAGNOSIS — J4489 Other specified chronic obstructive pulmonary disease: Secondary | ICD-10-CM

## 2023-09-15 DIAGNOSIS — J439 Emphysema, unspecified: Secondary | ICD-10-CM | POA: Diagnosis not present

## 2023-09-15 DIAGNOSIS — I2510 Atherosclerotic heart disease of native coronary artery without angina pectoris: Secondary | ICD-10-CM | POA: Diagnosis not present

## 2023-09-15 NOTE — Progress Notes (Signed)
[facility]  Chief Complaint  Patient presents with   Follow-up    Follow up discuss ct scan results     Past Surgical History:  She  has a past surgical history that includes Cholecystectomy; Eye surgery; Fracture surgery; laparoscopic appendectomy (N/A, 01/08/2019); and ORIF ankle fracture (Right, 04/26/2022).  Past Medical History:  Chronic pain, Depression, Diverticulitis, IBS, Myelomalacia, Panic disorder, PTSD, TMJ  Constitutional:  BP 104/70   Pulse 80   Ht 5' (1.524 m)   Wt 108 lb 6.4 oz (49.2 kg)   SpO2 92%   BMI 21.17 kg/m   Brief Summary:  [name] is a 67 y.o. female smoker with COPD with emphysema.       Subjective:   She is here with her husband.  CT chest showed mild emphysema, stable nodule, and coronary calcification.  Not having cough, wheeze, or sputum.  Has ankle surgery scheduled.  Physical Exam:   Appearance - well kempt   ENMT - no sinus tenderness, no oral exudate, no LAN, Mallampati 2 airway, no stridor  Respiratory - equal breath sounds bilaterally, no wheezing or rales  CV - s1s2 regular rate and rhythm, no murmurs  Ext - no clubbing, no edema  Skin - no rashes  Psych - normal mood and affect    Pulmonary testing:  A1AT 04/26/22 >> 180 PFT 10/08/22 >> FEV1 1.39 (67%), FEV1% 59, TLC 6.90 (154%), DLCO 48%  Chest Imaging:  CT angio chest 04/24/22 >> mild emphysema LDCT chest 08/20/23 >> emphysema, granulomas, 3.8 mm nodule RLL  Sleep Tests:  PSG 02/21/23 >> AHI 0.5, SpO2 low 79%. Spent 253 min with SpO2 < 88%.   Cardiac Tests:  Echo 04/26/22 >> EF 65 to 70%, grade 1 DD  Social History:  She  reports that she has been smoking cigarettes. She has a 52 pack-year smoking history. She has never used smokeless tobacco. She reports that she does not drink alcohol and does not use drugs.  Family History:  Her family history includes Coronary artery disease in her father; Migraines in  her mother; Polymyalgia rheumatica in her mother.     Assessment/Plan:   COPD with emphysema. - continuebreztri - will arrange for albuterol by nebulizer  History of tobacco abuse. - follow up low dose CT chest in September 2025  Nocturnal hypoxemia. - seen during sleep study - ONO pending  Coronary calcification on CT chest. - will arrange for referral to [facility]ology [facility]  Cervical myelopathy. - followed by neurology at [facility]  Rt ankle fracture with chronic pain. - followed at [facility] - no pulmonary restrictions for her to have surgery  Time Spent Involved in Patient Care on Day of Examination:  27 minutes  Follow up:   Patient Instructions  Will arrange for referral to cardiology with [facility]  Follow up in 6 months  Medication List:   Allergies as of 09/15/2023       Reactions   Amoxicillin-pot Clavulanate Other (See Comments)   GI upset   Naltrexone Other (See Comments)   Hallucinations, sleepiness, withdrawal s/s, poor apepitie   Nsaids    Advised not to take    Toradol [ketorolac Tromethamine] Nausea And Vomiting, Swelling   TONGUE SWELLING   Tramadol Nausea And Vomiting   Atorvastatin Other (See Comments), Itching, Nausea Only, Tinitus   Benadryl [diphenhydramine Hcl] Palpitations   Insomnia   Ceftin [cefuroxime Axetil] Rash   Compazine Palpitations   Insomnia  Epinephrine Palpitations   Insomina        Medication List        Accurate as of September 15, 2023  3:47 PM. If you have any questions, ask your nurse or doctor.          acetaminophen 500 MG tablet Commonly known as: TYLENOL Take 1,000 mg by mouth 4 (four) times daily as needed (pain). Taking 3 times a day as needed   albuterol (2.5 MG/3ML) 0.083% nebulizer solution Commonly known as: PROVENTIL Take 3 mLs (2.5 mg total) by nebulization every 6 (six) hours as needed for wheezing or shortness of breath.   Azelastine HCl 137 MCG/SPRAY Soln Place 1 spray into the nose  daily as needed (for nasal congestion).   baclofen 10 MG tablet Commonly known as: LIORESAL Take 0.5 tablets (5 mg total) by mouth 2 (two) times daily. What changed:  how much to take when to take this   Breztri Aerosphere 160-9-4.8 MCG/ACT Aero Generic drug: Budeson-Glycopyrrol-Formoterol inhale TWO PUFFS TWICE DAILY morning AND bedtime   carboxymethylcellulose 0.5 % Soln Commonly known as: REFRESH PLUS Place 2-4 drops into both eyes See admin instructions. 2-4 drops into both eyes three times a day as needed for dry eye   chlorhexidine 0.12 % solution Commonly known as: PERIDEX Use as directed 15 mLs in the mouth or throat daily as needed (mouth discomfort).   clonazePAM 1 MG tablet Commonly known as: KLONOPIN Take 0.5 tablets (0.5 mg total) by mouth 2 (two) times daily as needed for anxiety. *May take one additional tablet as needed for anxiety/PTSD What changed:  how much to take when to take this additional instructions   fluticasone 50 MCG/ACT nasal spray Commonly known as: FLONASE Place 2 sprays into both nostrils 2 (two) times daily as needed for allergies.   gabapentin 300 MG capsule Commonly known as: NEURONTIN Take 900 mg by mouth See admin instructions. 900 mg twice a day and 600 mg once daily.   loperamide 2 MG capsule Commonly known as: IMODIUM Take 2 mg by mouth 4 (four) times daily as needed for diarrhea or loose stools.   Muro 128 5 % ophthalmic ointment Generic drug: sodium chloride Place 1 application. into both eyes at bedtime.   oxyCODONE 5 MG immediate release tablet Commonly known as: Roxicodone Take 1 tablet (5 mg total) by mouth every 6 (six) hours as needed for severe pain.   oxyCODONE-acetaminophen 10-325 MG tablet Commonly known as: PERCOCET Take 1 tablet 4 times a day by oral route as needed for 30 days.   pyridOXINE 50 MG tablet Commonly known as: VITAMIN B6 Take 50 mg by mouth daily.   Vitamin D3 125 MCG (5000 UT) Caps Take 1  capsule by mouth in the morning and at bedtime. Taking a day        Signature:  Coralyn Helling, MD [facility] Pulmonary/Critical Care Pager - [phone] 09/15/2023, 3:47 PM

## 2023-09-15 NOTE — Patient Instructions (Signed)
Will arrange for referral to cardiology with [facility]  Follow up in 6 months

## 2023-10-01 ENCOUNTER — Encounter (HOSPITAL_BASED_OUTPATIENT_CLINIC_OR_DEPARTMENT_OTHER): Payer: Self-pay | Admitting: Orthopaedic Surgery

## 2023-10-01 ENCOUNTER — Other Ambulatory Visit: Payer: Self-pay

## 2023-10-01 NOTE — Progress Notes (Addendum)
Chart reviewed with Dr. Chaney Malling concerning need for 3 liters O2 via Nasal Cannula at bedtime. Sleep apnea study negative and cleared by pulmonologist [facility] for procedure. Per Dr. Chaney Malling okay to proceed at [facility]. Pt to have oxygen sats checked and faxed prior to procedure date.

## 2023-10-06 NOTE — H&P (Signed)
ORTHOPAEDIC SURGERY H&P  Subjective:  The patient presents with right ankle wound s/p prior ankle ORIF.   Past Medical History:  Diagnosis Date   Chronic pain    gneralized denies fibromyalgia   COPD (chronic obstructive pulmonary disease) (HCC)    Depression    Diverticulosis    Dyspnea    IBS (irritable bowel syndrome)    Myelomalacia (HCC)    Panic disorder    Panic disorder    PTSD (post-traumatic stress disorder)    PTSD (post-traumatic stress disorder) 01/08/2019   Temporomandibular joint disorder (TMJ)    age 67 fractured TMJ - repaired by oral surgeon    Past Surgical History:  Procedure Laterality Date   APPENDECTOMY     CHOLECYSTECTOMY     EYE SURGERY     FRACTURE SURGERY     LAPAROSCOPIC APPENDECTOMY N/A 01/08/2019   Procedure: APPENDECTOMY LAPAROSCOPIC;  Surgeon: [name], MD;  Location: [facility];  Service: General;  Laterality: N/A;   ORIF ANKLE FRACTURE Right 04/26/2022   Procedure: OPEN REDUCTION INTERNAL FIXATION (ORIF) ANKLE FRACTURE;  Surgeon: [name], MD;  Location: [facility];  Service: Orthopedics;  Laterality: Right;     (Not in an outpatient encounter)    Allergies  Allergen Reactions   Clindamycin Nausea And Vomiting and Rash   Amoxicillin-Pot Clavulanate Other (See Comments)    GI upset   Naltrexone Other (See Comments)    Hallucinations, sleepiness, withdrawal s/s, poor apepitie    Nsaids     Advised not to take    Toradol [Ketorolac Tromethamine] Nausea And Vomiting and Swelling    TONGUE SWELLING   Tramadol Nausea And Vomiting   Atorvastatin Other (See Comments), Itching, Nausea Only and Tinitus   Benadryl [Diphenhydramine Hcl] Palpitations    Insomnia   Ceftin [Cefuroxime Axetil] Rash   Compazine Palpitations    Insomnia   Epinephrine Palpitations    Insomina    Social History   Socioeconomic History   Marital status: Married    Spouse name: Not on file   Number of children: Not on file   Years of  education: Not on file   Highest education level: Not on file  Occupational History   Not on file  Tobacco Use   Smoking status: Every Day    Current packs/day: 1.00    Average packs/day: 1 pack/day for 52.0 years (52.0 ttl pk-yrs)    Types: Cigarettes   Smokeless tobacco: Never  Vaping Use   Vaping status: Never Used  Substance and Sexual Activity   Alcohol use: No   Drug use: No   Sexual activity: Yes    Birth control/protection: Post-menopausal  Other Topics Concern   Not on file  Social History Narrative   Not on file   Social Determinants of Health   Financial Resource Strain: Not on file  Food Insecurity: Not on file  Transportation Needs: Not on file  Physical Activity: Not on file  Stress: Not on file  Social Connections: Not on file  Intimate Partner Violence: Not on file     History reviewed. No pertinent family history.   Review of Systems Pertinent items are noted in HPI.  Objective: Vital signs in last 24 hours:    10/01/2023    1:53 PM 09/15/2023    3:11 PM 03/17/2023    3:04 PM  Vitals with BMI  Height 5\' 0"  5\' 0"  5\' 0"   Weight 108 lbs 7 oz 108 lbs 6 oz 107 lbs 10  oz  BMI 21.18 21.17 21.01  Systolic  104 124  Diastolic  70 72  Pulse  80 82      EXAM: General: Well nourished, well developed. Awake, alert and oriented to time, place, person. Normal mood and affect. No apparent distress. Breathing room air.  Operative Lower Extremity: Alignment - Neutral Deformity - None Skin intact Tenderness to palpation - right medial ankle wound 5/5 TA, PT, GS, Per, EHL, FHL Sensation intact to light touch throughout Palpable DP and PT pulses Special testing: None  The contralateral foot/ankle was examined for comparison and noted to be neurovascularly intact with no localized deformity, swelling, or tenderness.  Imaging Review All images taken were independently reviewed by me.  Assessment/Plan: The clinical and radiographic findings were  reviewed and discussed at length with the patient.  The patient has right ankle wound s/p prior ankle ORIF.  We spoke at length about the natural course of these findings. We discussed nonoperative and operative treatment options in detail.  The risks and benefits were presented and reviewed. The risks due to inability to remove part/all of hardware, hardware failure/irritation, new/persistent/recurrent infection, stiffness, nerve/vessel/tendon injury, nonunion/malunion of any fracture, wound healing issues, allograft usage, development of arthritis, failure of this surgery, possibility of external fixation in certain situations, possibility of delayed definitive surgery, need for further surgery, prolonged wound care including further soft tissue coverage procedures, thromboembolic events, anesthesia/medical complications/events perioperatively and beyond, amputation, death among others were discussed. The patient acknowledged the explanation and agreed to proceed with the plan.  [name]  Orthopaedic Surgery [facility]

## 2023-10-06 NOTE — Discharge Instructions (Signed)
[name], MD [facility]  Please read the following information regarding your care after surgery.  Medications  You only need a prescription for the narcotic pain medicine (ex. oxycodone, Percocet, Norco).  All of the other medicines listed below are available over the counter. ? Aleve 2 pills twice a day for the first 3 days after surgery. ? acetominophen (Tylenol) 650 mg every 4-6 hours as you need for minor to moderate pain ? oxycodone as prescribed for severe pain  ? To help prevent blood clots, take aspirin (81 mg) twice daily for 42 days after surgery (or total duration of nonweightbearing).  You should also get up every hour while you are awake to move around.  Weight Bearing ? Do NOT bear any weight on the operated leg or foot. This means do NOT touch your surgical leg to the ground!  Cast / Splint / Dressing ? If you have a splint, do NOT remove this. Keep your splint, cast or dressing clean and dry.  Don't put anything (coat hanger, pencil, etc) down inside of it.  If it gets wet, call the office immediately to schedule an appointment for a cast change.  Swelling IMPORTANT: It is normal for you to have swelling where you had surgery. To reduce swelling and pain, keep at least 3 pillows under your leg so that your toes are above your nose and your heel is above the level of your hip.  It may be necessary to keep your foot or leg elevated for several weeks.  This is critical to helping your incisions heal and your pain to feel better.  Follow Up Call my office at [phone] when you are discharged from the hospital or surgery center to schedule an appointment to be seen 7-10 days after surgery.  Call my office at [phone] if you develop a fever >101.5 F, nausea, vomiting, bleeding from the surgical site or severe pain.

## 2023-10-07 NOTE — Plan of Care (Signed)
CHL Tonsillectomy/Adenoidectomy, Postoperative PEDS care plan entered in error.

## 2023-10-08 ENCOUNTER — Encounter (HOSPITAL_BASED_OUTPATIENT_CLINIC_OR_DEPARTMENT_OTHER): Payer: Self-pay | Admitting: Orthopaedic Surgery

## 2023-10-08 ENCOUNTER — Ambulatory Visit (HOSPITAL_BASED_OUTPATIENT_CLINIC_OR_DEPARTMENT_OTHER)
Admission: RE | Admit: 2023-10-08 | Discharge: 2023-10-08 | Disposition: A | Discharge status: Home / Self Care | Payer: 59 | Attending: Orthopaedic Surgery | Admitting: Orthopaedic Surgery

## 2023-10-08 ENCOUNTER — Other Ambulatory Visit: Payer: Self-pay

## 2023-10-08 ENCOUNTER — Encounter (HOSPITAL_BASED_OUTPATIENT_CLINIC_OR_DEPARTMENT_OTHER)
Admission: RE | Disposition: A | Discharge status: Home / Self Care | Payer: Self-pay | Source: Home / Self Care | Attending: Orthopaedic Surgery

## 2023-10-08 ENCOUNTER — Ambulatory Visit (HOSPITAL_BASED_OUTPATIENT_CLINIC_OR_DEPARTMENT_OTHER): Payer: 59 | Admitting: Anesthesiology

## 2023-10-08 ENCOUNTER — Ambulatory Visit (HOSPITAL_COMMUNITY): Payer: 59

## 2023-10-08 DIAGNOSIS — F17210 Nicotine dependence, cigarettes, uncomplicated: Secondary | ICD-10-CM

## 2023-10-08 DIAGNOSIS — J449 Chronic obstructive pulmonary disease, unspecified: Secondary | ICD-10-CM | POA: Diagnosis not present

## 2023-10-08 DIAGNOSIS — T8189XA Other complications of procedures, not elsewhere classified, initial encounter: Secondary | ICD-10-CM | POA: Insufficient documentation

## 2023-10-08 DIAGNOSIS — L97312 Non-pressure chronic ulcer of right ankle with fat layer exposed: Secondary | ICD-10-CM | POA: Insufficient documentation

## 2023-10-08 DIAGNOSIS — F32A Depression, unspecified: Secondary | ICD-10-CM | POA: Diagnosis not present

## 2023-10-08 DIAGNOSIS — J439 Emphysema, unspecified: Secondary | ICD-10-CM

## 2023-10-08 DIAGNOSIS — Z472 Encounter for removal of internal fixation device: Secondary | ICD-10-CM | POA: Diagnosis not present

## 2023-10-08 DIAGNOSIS — X58XXXA Exposure to other specified factors, initial encounter: Secondary | ICD-10-CM | POA: Diagnosis not present

## 2023-10-08 DIAGNOSIS — G8929 Other chronic pain: Secondary | ICD-10-CM | POA: Diagnosis not present

## 2023-10-08 DIAGNOSIS — F419 Anxiety disorder, unspecified: Secondary | ICD-10-CM | POA: Insufficient documentation

## 2023-10-08 HISTORY — PX: HARDWARE REMOVAL: SHX979

## 2023-10-08 HISTORY — DX: Legal blindness, as defined in USA: H54.8

## 2023-10-08 SURGERY — REMOVAL, HARDWARE
Anesthesia: General | Site: Ankle | Laterality: Right

## 2023-10-08 MED ORDER — MIDAZOLAM HCL 2 MG/2ML IJ SOLN
INTRAMUSCULAR | Status: AC
  Filled 2023-10-08: qty 2

## 2023-10-08 MED ORDER — ONDANSETRON HCL 4 MG/2ML IJ SOLN
INTRAMUSCULAR | Status: DC | PRN
  Administered 2023-10-08: 4 mg via INTRAVENOUS

## 2023-10-08 MED ORDER — 0.9 % SODIUM CHLORIDE (POUR BTL) OPTIME
TOPICAL | Status: DC | PRN
  Administered 2023-10-08: 300 mL

## 2023-10-08 MED ORDER — ACETAMINOPHEN 500 MG PO TABS
1000.0000 mg | ORAL_TABLET | Freq: Once | ORAL | Status: AC
Start: 2023-10-08 — End: 2023-10-08
  Administered 2023-10-08: 1000 mg via ORAL

## 2023-10-08 MED ORDER — FENTANYL CITRATE (PF) 100 MCG/2ML IJ SOLN
INTRAMUSCULAR | Status: AC
  Filled 2023-10-08: qty 2

## 2023-10-08 MED ORDER — DROPERIDOL 2.5 MG/ML IJ SOLN: 0.6250 mg | Freq: Once | INTRAMUSCULAR | Status: DC | PRN

## 2023-10-08 MED ORDER — ACETAMINOPHEN 500 MG PO TABS
ORAL_TABLET | ORAL | Status: AC
  Filled 2023-10-08: qty 2

## 2023-10-08 MED ORDER — LEVOFLOXACIN IN D5W 500 MG/100ML IV SOLN
INTRAVENOUS | Status: AC
  Filled 2023-10-08: qty 100

## 2023-10-08 MED ORDER — LACTATED RINGERS IV SOLN: INTRAVENOUS | Status: DC | PRN

## 2023-10-08 MED ORDER — FENTANYL CITRATE (PF) 100 MCG/2ML IJ SOLN
25.0000 ug | INTRAMUSCULAR | Status: DC | PRN
  Administered 2023-10-08 (×2): 50 ug via INTRAVENOUS

## 2023-10-08 MED ORDER — OXYCODONE HCL 5 MG PO TABS
5.0000 mg | ORAL_TABLET | Freq: Once | ORAL | Status: AC
  Administered 2023-10-08: 5 mg via ORAL

## 2023-10-08 MED ORDER — VANCOMYCIN HCL 500 MG IV SOLR
INTRAVENOUS | Status: DC | PRN
  Administered 2023-10-08: 500 mg via TOPICAL

## 2023-10-08 MED ORDER — PROPOFOL 10 MG/ML IV BOLUS
INTRAVENOUS | Status: AC
  Filled 2023-10-08: qty 20

## 2023-10-08 MED ORDER — LIDOCAINE HCL (CARDIAC) PF 100 MG/5ML IV SOSY
PREFILLED_SYRINGE | INTRAVENOUS | Status: DC | PRN
  Administered 2023-10-08: 40 mg via INTRAVENOUS

## 2023-10-08 MED ORDER — DEXAMETHASONE SODIUM PHOSPHATE 10 MG/ML IJ SOLN
INTRAMUSCULAR | Status: AC
  Filled 2023-10-08: qty 1

## 2023-10-08 MED ORDER — LEVOFLOXACIN IN D5W 500 MG/100ML IV SOLN
500.0000 mg | INTRAVENOUS | Status: AC
Start: 2023-10-08 — End: 2023-10-08
  Administered 2023-10-08: 500 mg via INTRAVENOUS

## 2023-10-08 MED ORDER — LIDOCAINE 2% (20 MG/ML) 5 ML SYRINGE
INTRAMUSCULAR | Status: AC
  Filled 2023-10-08: qty 5

## 2023-10-08 MED ORDER — MIDAZOLAM HCL 5 MG/5ML IJ SOLN
INTRAMUSCULAR | Status: DC | PRN
  Administered 2023-10-08: 2 mg via INTRAVENOUS

## 2023-10-08 MED ORDER — ONDANSETRON HCL 4 MG/2ML IJ SOLN
INTRAMUSCULAR | Status: AC
  Filled 2023-10-08: qty 2

## 2023-10-08 MED ORDER — FENTANYL CITRATE (PF) 100 MCG/2ML IJ SOLN: INTRAMUSCULAR | Status: DC | PRN

## 2023-10-08 MED ORDER — CHLORHEXIDINE GLUCONATE 4 % EX SOLN: 60.0000 mL | Freq: Once | CUTANEOUS | Status: DC

## 2023-10-08 MED ORDER — FENTANYL CITRATE (PF) 100 MCG/2ML IJ SOLN
INTRAMUSCULAR | Status: DC | PRN
  Administered 2023-10-08 (×2): 25 ug via INTRAVENOUS

## 2023-10-08 MED ORDER — OXYCODONE HCL 5 MG PO TABS
ORAL_TABLET | ORAL | Status: AC
  Filled 2023-10-08: qty 1

## 2023-10-08 MED ORDER — PROPOFOL 10 MG/ML IV BOLUS
INTRAVENOUS | Status: DC | PRN
  Administered 2023-10-08: 120 mg via INTRAVENOUS

## 2023-10-08 MED ORDER — VANCOMYCIN HCL IN DEXTROSE 1-5 GM/200ML-% IV SOLN
INTRAVENOUS | Status: AC
  Filled 2023-10-08: qty 200

## 2023-10-08 MED ORDER — BUPIVACAINE HCL 0.25 % IJ SOLN
INTRAMUSCULAR | Status: DC | PRN
  Administered 2023-10-08: 10 mL

## 2023-10-08 MED ORDER — VANCOMYCIN HCL IN DEXTROSE 1-5 GM/200ML-% IV SOLN
1000.0000 mg | INTRAVENOUS | Status: AC
Start: 2023-10-08 — End: 2023-10-08
  Administered 2023-10-08: 1000 mg via INTRAVENOUS

## 2023-10-08 MED ORDER — DEXAMETHASONE SODIUM PHOSPHATE 10 MG/ML IJ SOLN
INTRAMUSCULAR | Status: DC | PRN
  Administered 2023-10-08: 10 mg via INTRAVENOUS

## 2023-10-08 MED ORDER — VANCOMYCIN HCL 500 MG IV SOLR
INTRAVENOUS | Status: AC
  Filled 2023-10-08: qty 10

## 2023-10-08 SURGICAL SUPPLY — 65 items
APL PRP STRL LF DISP 70% ISPRP (MISCELLANEOUS) ×2
BANDAGE ESMARK 6X9 LF (GAUZE/BANDAGES/DRESSINGS) ×1 IMPLANT
BLADE SURG 15 STRL LF DISP TIS (BLADE) ×4 IMPLANT
BLADE SURG 15 STRL SS (BLADE) ×4
BNDG CMPR 5X4 CHSV STRCH STRL (GAUZE/BANDAGES/DRESSINGS) ×1
BNDG CMPR 5X4 KNIT ELC UNQ LF (GAUZE/BANDAGES/DRESSINGS) ×1
BNDG CMPR 6 X 5 YARDS HK CLSR (GAUZE/BANDAGES/DRESSINGS) ×1
BNDG CMPR 9X6 STRL LF SNTH (GAUZE/BANDAGES/DRESSINGS) ×1
BNDG COHESIVE 4X5 TAN STRL LF (GAUZE/BANDAGES/DRESSINGS) ×1 IMPLANT
BNDG ELASTIC 4INX 5YD STR LF (GAUZE/BANDAGES/DRESSINGS) ×1 IMPLANT
BNDG ELASTIC 6INX 5YD STR LF (GAUZE/BANDAGES/DRESSINGS) ×1 IMPLANT
BNDG ESMARK 6X9 LF (GAUZE/BANDAGES/DRESSINGS) ×1
BOOT STEPPER DURA SM (SOFTGOODS) IMPLANT
BRUSH SCRUB EZ 4% CHG (MISCELLANEOUS) ×1 IMPLANT
CANISTER SUCT 1200ML W/VALVE (MISCELLANEOUS) ×1 IMPLANT
CHLORAPREP W/TINT 26 (MISCELLANEOUS) ×2 IMPLANT
COVER BACK TABLE 60X90IN (DRAPES) ×1 IMPLANT
CUFF TOURN SGL QUICK 34 (TOURNIQUET CUFF)
CUFF TRNQT CYL 34X4.125X (TOURNIQUET CUFF) IMPLANT
DRAPE C-ARM 42X72 X-RAY (DRAPES) ×1 IMPLANT
DRAPE C-ARMOR (DRAPES) ×1 IMPLANT
DRAPE EXTREMITY T 121X128X90 (DISPOSABLE) ×1 IMPLANT
DRAPE IMP U-DRAPE 54X76 (DRAPES) ×1 IMPLANT
DRAPE U-SHAPE 47X51 STRL (DRAPES) ×1 IMPLANT
DRSG MEPITEL 4X7.2 (GAUZE/BANDAGES/DRESSINGS) ×1 IMPLANT
ELECT REM PT RETURN 9FT ADLT (ELECTROSURGICAL) ×1
ELECTRODE REM PT RTRN 9FT ADLT (ELECTROSURGICAL) ×1 IMPLANT
GAUZE PAD ABD 8X10 STRL (GAUZE/BANDAGES/DRESSINGS) IMPLANT
GAUZE SPONGE 4X4 12PLY STRL (GAUZE/BANDAGES/DRESSINGS) ×1 IMPLANT
GLOVE BIOGEL PI IND STRL 8 (GLOVE) ×1 IMPLANT
GLOVE SURG SS PI 7.5 STRL IVOR (GLOVE) ×2 IMPLANT
GOWN STRL REUS W/ TWL LRG LVL3 (GOWN DISPOSABLE) ×2 IMPLANT
GOWN STRL REUS W/TWL LRG LVL3 (GOWN DISPOSABLE) ×2
MARKER SKIN DUAL TIP RULER LAB (MISCELLANEOUS) IMPLANT
NDL HYPO 25X1 1.5 SAFETY (NEEDLE) IMPLANT
NEEDLE HYPO 25X1 1.5 SAFETY (NEEDLE) IMPLANT
NS IRRIG 1000ML POUR BTL (IV SOLUTION) ×1 IMPLANT
PACK BASIN DAY SURGERY FS (CUSTOM PROCEDURE TRAY) ×1 IMPLANT
PAD CAST 4YDX4 CTTN HI CHSV (CAST SUPPLIES) ×1 IMPLANT
PADDING CAST ABS COTTON 4X4 ST (CAST SUPPLIES) IMPLANT
PADDING CAST COTTON 4X4 STRL (CAST SUPPLIES) ×1
PADDING CAST COTTON 6X4 STRL (CAST SUPPLIES) ×1 IMPLANT
PADDING CAST SYNTHETIC 4X4 STR (CAST SUPPLIES) ×4 IMPLANT
PENCIL SMOKE EVACUATOR (MISCELLANEOUS) ×1 IMPLANT
SHEET MEDIUM DRAPE 40X70 STRL (DRAPES) ×1 IMPLANT
SLEEVE SCD COMPRESS KNEE MED (STOCKING) ×1 IMPLANT
SPIKE FLUID TRANSFER (MISCELLANEOUS) IMPLANT
SPLINT PLASTER CAST FAST 5X30 (CAST SUPPLIES) IMPLANT
SPONGE T-LAP 18X18 ~~LOC~~+RFID (SPONGE) ×1 IMPLANT
STAPLER VISISTAT 35W (STAPLE) IMPLANT
STOCKINETTE 6 STRL (DRAPES) ×1 IMPLANT
STOCKINETTE ORTHO 6X25 (MISCELLANEOUS) ×1 IMPLANT
SUCTION TUBE FRAZIER 10FR DISP (SUCTIONS) IMPLANT
SUT ETHILON 2 0 FS 18 (SUTURE) ×2 IMPLANT
SUT MNCRL AB 3-0 PS2 18 (SUTURE) ×1 IMPLANT
SUT VIC AB 2-0 SH 27 (SUTURE) ×1
SUT VIC AB 2-0 SH 27XBRD (SUTURE) ×1 IMPLANT
SUT VIC AB 3-0 SH 27 (SUTURE)
SUT VIC AB 3-0 SH 27X BRD (SUTURE) IMPLANT
SUT VICRYL 0 SH 27 (SUTURE) IMPLANT
SYR BULB IRRIG 60ML STRL (SYRINGE) ×1 IMPLANT
SYR CONTROL 10ML LL (SYRINGE) IMPLANT
TOWEL GREEN STERILE FF (TOWEL DISPOSABLE) ×2 IMPLANT
TUBE CONNECTING 20X1/4 (TUBING) ×1 IMPLANT
UNDERPAD 30X36 HEAVY ABSORB (UNDERPADS AND DIAPERS) ×1 IMPLANT

## 2023-10-08 NOTE — Anesthesia Preprocedure Evaluation (Addendum)
Anesthesia Evaluation  Patient identified by MRN, date of birth, ID band Patient awake    Reviewed: Allergy & Precautions, NPO status , Patient's Chart, lab work & pertinent test results  Airway Mallampati: II  TM Distance: >3 FB Neck ROM: Full    Dental  (+) Teeth Intact, Dental Advisory Given   Pulmonary COPD,  COPD inhaler, Current Smoker and Patient abstained from smoking., former smoker    + decreased breath sounds      Cardiovascular negative cardio ROS  Rhythm:Regular Rate:Normal     Neuro/Psych  PSYCHIATRIC DISORDERS Anxiety Depression    negative neurological ROS     GI/Hepatic negative GI ROS, Neg liver ROS,,,  Endo/Other  negative endocrine ROS    Renal/GU negative Renal ROS     Musculoskeletal negative musculoskeletal ROS (+)    Abdominal Normal abdominal exam  (+)   Peds  Hematology negative hematology ROS (+)   Anesthesia Other Findings   Reproductive/Obstetrics                             Anesthesia Physical Anesthesia Plan  ASA: 3  Anesthesia Plan: General   Post-op Pain Management: Tylenol PO (pre-op)*   Induction: Intravenous  PONV Risk Score and Plan: 3 and Ondansetron, Propofol infusion and Midazolam  Airway Management Planned: LMA  Additional Equipment: None  Intra-op Plan:   Post-operative Plan: Extubation in OR  Informed Consent: I have reviewed the patients History and Physical, chart, labs and discussed the procedure including the risks, benefits and alternatives for the proposed anesthesia with the patient or authorized representative who has indicated his/her understanding and acceptance.       Plan Discussed with: CRNA  Anesthesia Plan Comments: (Risks and benefits of PNB discussed. Pt declines nerve block. )        Anesthesia Quick Evaluation

## 2023-10-08 NOTE — Anesthesia Postprocedure Evaluation (Signed)
Anesthesia Post Note  Patient: [name]  Procedure(s) Performed: HARDWARE REMOVAL, right ankle (Right: Ankle)     Patient location during evaluation: PACU Anesthesia Type: General Level of consciousness: awake and alert Pain management: pain level controlled Vital Signs Assessment: post-procedure vital signs reviewed and stable Respiratory status: spontaneous breathing, nonlabored ventilation, respiratory function stable and patient connected to nasal cannula oxygen Cardiovascular status: blood pressure returned to baseline and stable Postop Assessment: no apparent nausea or vomiting Anesthetic complications: no  No notable events documented.  Last Vitals:  Vitals:   10/08/23 1104 10/08/23 1130  BP:    Pulse:    Resp: 16   Temp: (!) 36.2 C   SpO2:  96%    Last Pain:  Vitals:   10/08/23 1123  TempSrc:   PainSc: 6                  Kennieth Rad

## 2023-10-08 NOTE — H&P (Signed)
H&P Update:  -History and Physical Reviewed  -Patient has been re-examined  -No change in the plan of care. Plan today is for removal of medial malleolus hardware  -The risks and benefits were presented and reviewed. The risks due to inability to remove part/all of hardware, hardware/suture failure and/or irritation, new/persistent infection, stiffness, nerve/vessel/tendon injury or rerupture of repaired tendon, nonunion/malunion, allograft usage, wound healing issues, development of arthritis, failure of this surgery, possibility of external fixation with delayed definitive surgery, need for further surgery, thromboembolic events, anesthesia/medical complications, amputation, death among others were discussed. The patient acknowledged the explanation, agreed to proceed with the plan and a consent was signed.  [name]

## 2023-10-08 NOTE — Transfer of Care (Signed)
Immediate Anesthesia Transfer of Care Note  Patient: [name]  Procedure(s) Performed: HARDWARE REMOVAL, right ankle (Right: Ankle)  Patient Location: PACU  Anesthesia Type:General  Level of Consciousness: drowsy  Airway & Oxygen Therapy: Patient Spontanous Breathing and Patient connected to face mask oxygen  Post-op Assessment: Report given to RN and Post -op Vital signs reviewed and stable  Post vital signs: Reviewed and stable  Last Vitals:  Vitals Value Taken Time  BP 123/65 10/08/23 1104  Temp    Pulse 70 10/08/23 1107  Resp 8 10/08/23 1107  SpO2 98 % 10/08/23 1107  Vitals shown include unfiled device data.  Last Pain:  Vitals:   10/08/23 0748  TempSrc: Temporal  PainSc: 7          Complications: No notable events documented.

## 2023-10-08 NOTE — Anesthesia Procedure Notes (Signed)
Procedure Name: LMA Insertion Date/Time: 10/08/2023 10:24 AM  Performed by: Lauralyn Primes, CRNAPre-anesthesia Checklist: Patient identified, Emergency Drugs available, Suction available and Patient being monitored Patient Re-evaluated:Patient Re-evaluated prior to induction Oxygen Delivery Method: Circle system utilized Preoxygenation: Pre-oxygenation with 100% oxygen Induction Type: IV induction Ventilation: Mask ventilation without difficulty LMA: LMA inserted LMA Size: 3.0 Number of attempts: 1 Airway Equipment and Method: Bite block Placement Confirmation: positive ETCO2 Tube secured with: Tape Dental Injury: Teeth and Oropharynx as per pre-operative assessment

## 2023-10-09 ENCOUNTER — Encounter (HOSPITAL_BASED_OUTPATIENT_CLINIC_OR_DEPARTMENT_OTHER): Payer: Self-pay | Admitting: Orthopaedic Surgery

## 2023-10-13 NOTE — Op Note (Signed)
10/08/2023  2:12 PM   PATIENT: [name]  67 y.o. female  MRN: [id number]   PRE-OPERATIVE DIAGNOSIS:   Closed bimalleolar fracture of right ankle s/p ORIF and now non healing ulcer over medial ankle   POST-OPERATIVE DIAGNOSIS:   Same   PROCEDURE: Right ankle medial malleolus hardware removal   SURGEON:  [name], MD   ASSISTANT: None   ANESTHESIA: General, regional   EBL: Minimal   TOURNIQUET:    Total Tourniquet Time Documented: Thigh (Right) - 19 minutes Total: Thigh (Right) - 19 minutes    COMPLICATIONS: None apparent   DISPOSITION: Extubated, awake and stable to recovery.   INDICATION FOR PROCEDURE: The patient presented with above diagnosis.  We discussed the diagnosis, alternative treatment options, risks and benefits of the above surgical intervention, as well as alternative non-operative treatments. All questions/concerns were addressed and the patient/family demonstrated appropriate understanding of the diagnosis, the procedure, the postoperative course, and overall prognosis. The patient wished to proceed with surgical intervention and signed an informed surgical consent as such, in each others presence prior to surgery.   PROCEDURE IN DETAIL: After preoperative consent was obtained and the correct operative site was identified, the patient was brought to the operating room supine on stretcher and transferred onto operating table. General anesthesia was induced. Preoperative antibiotics were administered. Surgical timeout was taken. The patient was then positioned supine with an ipsilateral hip bump. The operative lower extremity was prepped and draped in standard sterile fashion with a tourniquet around the thigh. The extremity was exsanguinated and the tourniquet was inflated to 275 mmHg.  We then used prior direct medial ankle approach and extended this proximally in anticipation of explanting the hook plate. Dissection was carried  down to the level of the medial malleolar plate. The screws were removed and the plate explanted without issues. No motion was noted at prior fracture site either clinically or fluoroscopically.  The surgical sites were thoroughly irrigated. The tourniquet was deflated and hemostasis achieved. Betadine and vancomycin powder were applied. The deep layers were closed using 2-0 vicryl. The skin was closed without tension.    The leg was cleaned with saline and sterile dressings with gauze were applied. A well padded bulky wrap was applied. The patient was awakened from anesthesia and transported to the recovery room in stable condition.    FOLLOW UP PLAN: -transfer to PACU, then home -protected weightbearing operative extremity, maximum elevation -DVT ppx: Aspirin 81 mg twice daily x 1 month -follow up as outpatient within 7-10 days for wound check -sutures out in 2-3 weeks in outpatient office   RADIOGRAPHS: AP, lateral, oblique and stress radiographs of the right ankle were obtained intraoperatively. These showed interval removal of medial malleolus plate. Manual stress radiographs were taken and the joints were noted to be stable following fixation. All hardware is appropriately positioned and of the appropriate lengths. No other acute injuries are noted.   [name] Orthopaedic Surgery [facility]

## 2023-10-30 ENCOUNTER — Ambulatory Visit: Payer: 59 | Admitting: Internal Medicine

## 2023-11-17 ENCOUNTER — Other Ambulatory Visit (HOSPITAL_COMMUNITY): Payer: Self-pay | Admitting: Internal Medicine

## 2023-11-17 DIAGNOSIS — Z1231 Encounter for screening mammogram for malignant neoplasm of breast: Secondary | ICD-10-CM

## 2024-01-16 ENCOUNTER — Ambulatory Visit: Payer: 59 | Admitting: Internal Medicine

## 2024-03-03 ENCOUNTER — Ambulatory Visit: Payer: Self-pay | Admitting: Internal Medicine

## 2024-04-20 ENCOUNTER — Other Ambulatory Visit (HOSPITAL_COMMUNITY): Payer: Self-pay | Admitting: Orthopaedic Surgery

## 2024-04-20 DIAGNOSIS — M25671 Stiffness of right ankle, not elsewhere classified: Secondary | ICD-10-CM

## 2024-04-26 ENCOUNTER — Ambulatory Visit (HOSPITAL_COMMUNITY)

## 2024-04-29 ENCOUNTER — Ambulatory Visit (HOSPITAL_COMMUNITY)
Admission: RE | Admit: 2024-04-29 | Discharge: 2024-04-29 | Disposition: A | Discharge status: Home / Self Care | Source: Ambulatory Visit | Attending: Orthopaedic Surgery | Admitting: Orthopaedic Surgery

## 2024-04-29 ENCOUNTER — Ambulatory Visit: Admission: RE | Admit: 2024-04-29 | Source: Ambulatory Visit

## 2024-04-29 DIAGNOSIS — M25671 Stiffness of right ankle, not elsewhere classified: Secondary | ICD-10-CM | POA: Diagnosis present

## 2024-06-02 ENCOUNTER — Ambulatory Visit (HOSPITAL_COMMUNITY)

## 2024-06-09 ENCOUNTER — Encounter (HOSPITAL_BASED_OUTPATIENT_CLINIC_OR_DEPARTMENT_OTHER): Payer: Self-pay | Admitting: Orthopaedic Surgery

## 2024-06-09 NOTE — Progress Notes (Signed)
 While doing patient's anesthesia pre surgery assessment, she states that she is on O2 at 3l/[place] at night. Per [facility] guidelines we do not do patients on oxygen . Lori at Dr [place] office made aware that she will need to be done at Main OR.

## 2024-06-14 NOTE — Progress Notes (Signed)
 Sent message, via epic in basket, requesting orders in epic from Careers adviser.

## 2024-06-15 ENCOUNTER — Encounter (HOSPITAL_COMMUNITY): Payer: Self-pay

## 2024-06-15 ENCOUNTER — Other Ambulatory Visit: Payer: Self-pay

## 2024-06-15 NOTE — H&P (Signed)
 ORTHOPAEDIC SURGERY H&P  Subjective:  The patient presents for right ankle hardware removal.   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   COPD (chronic obstructive pulmonary disease) (HCC)    Depression    Diverticulosis    GERD (gastroesophageal reflux disease)    IBS (irritable bowel syndrome)    Legally blind in right eye, as defined in [place]     Myelomalacia (HCC)    Oxygen  dependent    uses O2 at 3L/[place] at night   Panic disorder    Panic disorder    Pneumonia    as a child   PTSD (post-traumatic stress disorder)    PTSD (post-traumatic stress disorder) 01/08/2019   Temporomandibular joint disorder (TMJ)    age 68 fractured TMJ - repaired by oral surgeon    Past Surgical History:  Procedure Laterality Date   APPENDECTOMY     CHOLECYSTECTOMY     EYE SURGERY     FRACTURE SURGERY     HARDWARE REMOVAL Right 10/08/2023   Procedure: HARDWARE REMOVAL, right ankle;  Surgeon: Barton Drape, MD;  Location: [facility];  Service: Orthopedics;  Laterality: Right;   LAPAROSCOPIC APPENDECTOMY N/A 01/08/2019   Procedure: APPENDECTOMY LAPAROSCOPIC;  Surgeon: Kallie Manuelita BROCKS, MD;  Location: [facility];  Service: General;  Laterality: N/A;   ORIF ANKLE FRACTURE Right 04/26/2022   Procedure: OPEN REDUCTION INTERNAL FIXATION (ORIF) ANKLE FRACTURE;  Surgeon: Barton Drape, MD;  Location: [facility];  Service: Orthopedics;  Laterality: Right;     (Not in an outpatient encounter)    Allergies  Allergen Reactions   Clindamycin  Nausea And Vomiting and Rash   Augmentin  [Amoxicillin -Pot Clavulanate] Other (See Comments)    GI upset   Ketamine     Pt preference    Naltrexone Other (See Comments)    Hallucinations, sleepiness, withdrawal s/s, poor apepitie    Nsaids     Advised not to take    Toradol  [Ketorolac  Tromethamine ] Nausea And Vomiting and Swelling    TONGUE SWELLING    Tramadol  Nausea And Vomiting   Atorvastatin Other (See Comments), Itching, Nausea Only and Tinitus   Benadryl [Diphenhydramine Hcl] Palpitations    Insomnia   Ceftin [Cefuroxime Axetil] Rash   Compazine Palpitations    Insomnia   Epinephrine  Palpitations    Insomina    Social History   Socioeconomic History   Marital status: Married    Spouse name: Not on file   Number of children: Not on file   Years of education: Not on file   Highest education level: Not on file  Occupational History   Not on file  Tobacco Use   Smoking status: Every Day    Current packs/day: 1.00    Average packs/day: 1 pack/day for 52.0 years (52.0 ttl pk-yrs)    Types: Cigarettes   Smokeless tobacco: Never  Vaping Use   Vaping status: Never Used  Substance and Sexual Activity   Alcohol  use: No   Drug use: No   Sexual activity: Yes    Birth control/protection: Post-menopausal  Other Topics Concern   Not on file  Social History Narrative   Not on file   Social Drivers of Health   Financial Resource Strain: Not on file  Food Insecurity: Not on file  Transportation Needs: Not on file  Physical Activity: Not on file  Stress: Not on file  Social  Connections: Not on file  Intimate Partner Violence: Not on file     History reviewed. No pertinent family history.   Review of Systems Pertinent items are noted in HPI.  Objective: Vital signs in last 24 hours:    10/08/2023   11:47 AM 10/08/2023    7:48 AM 10/01/2023    1:53 PM  Vitals with BMI  Height  5' 0 5' 0  Weight  102 lbs 5 oz 108 lbs 7 oz  BMI  19.98 21.18  Systolic 102 125   Diastolic 60 75   Pulse 80 82       EXAM: General: Well nourished, well developed. Awake, alert and oriented to time, place, person. Normal mood and affect. No apparent distress. Breathing room air.  Operative Lower Extremity: Alignment - Neutral Deformity - None Skin intact Tenderness to palpation - none 5/5 TA, PT, GS, Per, EHL,  FHL Sensation intact to light touch throughout Palpable DP and PT pulses Special testing: None  The contralateral foot/ankle was examined for comparison and noted to be neurovascularly intact with no localized deformity, swelling, or tenderness.  Imaging Review All images taken were independently reviewed by me.  Assessment/Plan: The clinical and radiographic findings were reviewed and discussed at length with the patient.  The patient presents for right ankle hardware removal.  We spoke at length about the natural course of these findings. We discussed nonoperative and operative treatment options in detail.  The risks and benefits were presented and reviewed. The risks due to suture/hardware failure/irritation (or if removing hardware inability to remove part/all of hardware, recurrent instability), new/persistent/recurrent infection, stiffness, nerve/vessel/tendon injury, nonunion/malunion of any fracture, wound healing issues, allograft usage, development of arthritis, failure of this surgery, possibility of external fixation in certain situations, possibility of delayed definitive surgery, need for further surgery, prolonged wound care including further soft tissue coverage procedures, thromboembolic events, anesthesia/medical complications/events perioperatively and beyond, amputation, death among others were discussed. The patient acknowledged the explanation and agreed to proceed with the plan.  [name]  Orthopaedic Surgery [facility]

## 2024-06-15 NOTE — Patient Instructions (Signed)
 SURGICAL WAITING ROOM VISITATION  Patients having surgery or a procedure may have no more than 2 support people in the waiting area - these visitors may rotate.    Children under the age of 85 must have an adult with them who is not the patient.  Visitors with respiratory illnesses are discouraged from visiting and should remain at home.  If the patient needs to stay at the hospital during part of their recovery, the visitor guidelines for inpatient rooms apply. Pre-op nurse will coordinate an appropriate time for 1 support person to accompany patient in pre-op.  This support person may not rotate.    Please refer to the [facility] website for the visitor guidelines for Inpatients (after your surgery is over and you are in a regular room).       Your procedure is scheduled on:  06/23/2024    Report to [facility] Main Entrance    Report to admitting at  0630 AM   Call this number if you have problems the morning of surgery [phone]   Do not eat food :After Midnight.   After Midnight you may have the following liquids until __0530____ AM  DAY OF SURGERY  Water Non-Citrus Juices (without pulp, NO RED-Apple, White grape, White cranberry) Black Coffee (NO MILK/CREAM OR CREAMERS, sugar ok)  Clear Tea (NO MILK/CREAM OR CREAMERS, sugar ok) regular and decaf                             Plain Jell-O (NO RED)                                           Fruit ices (not with fruit pulp, NO RED)                                     Popsicles (NO RED)                                                               Sports drinks like Gatorade (NO R               If you have questions, please contact your surgeon's office.      Oral Hygiene is also important to reduce your risk of infection.                                    Remember - BRUSH YOUR TEETH THE MORNING OF SURGERY WITH YOUR REGULAR TOOTHPASTE  DENTURES WILL BE REMOVED PRIOR TO SURGERY PLEASE DO NOT APPLY Poly grip OR  ADHESIVES!!!   Do NOT smoke after Midnight   Stop all vitamins and herbal supplements 7 days before surgery.   Take these medicines the morning of surgery with A SIP OF WATER:  nebulzier if needed, inhalers as usual and bring, clonazepam , gabapentin    DO NOT TAKE ANY ORAL DIABETIC MEDICATIONS DAY OF YOUR SURGERY  Bring CPAP mask and tubing day of surgery.  You may not have any metal on your body including hair pins, jewelry, and body piercing             Do not wear make-up, lotions, powders, perfumes/cologne, or deodorant  Do not wear nail polish including gel and S&S, artificial/acrylic nails, or any other type of covering on natural nails including finger and toenails. If you have artificial nails, gel coating, etc. that needs to be removed by a nail salon please have this removed prior to surgery or surgery may need to be canceled/ delayed if the surgeon/ anesthesia feels like they are unable to be safely monitored.   Do not shave  48 hours prior to surgery.               Men may shave face and neck.   Do not bring valuables to the hospital. [facility] IS NOT             RESPONSIBLE   FOR VALUABLES.   Contacts, glasses, dentures or bridgework may not be worn into surgery.   Bring small overnight bag day of surgery.   DO NOT BRING YOUR HOME MEDICATIONS TO THE HOSPITAL. PHARMACY WILL DISPENSE MEDICATIONS LISTED ON YOUR MEDICATION LIST TO YOU DURING YOUR ADMISSION IN THE HOSPITAL!    Patients discharged on the day of surgery will not be allowed to drive home.  Someone NEEDS to stay with you for the first 24 hours after anesthesia.   Special Instructions: Bring a copy of your healthcare power of attorney and living will documents the day of surgery if you haven't scanned them before.              Please read over the following fact sheets you were given: IF YOU HAVE QUESTIONS ABOUT YOUR PRE-OP INSTRUCTIONS PLEASE CALL [phone]   If you received  a COVID test during your pre-op visit  it is requested that you wear a mask when out in public, stay away from anyone that may not be feeling well and notify your surgeon if you develop symptoms. If you test positive for Covid or have been in contact with anyone that has tested positive in the last 10 days please notify you surgeon.    [facility] - Preparing for Surgery Before surgery, you can play an important role.  Because skin is not sterile, your skin needs to be as free of germs as possible.  You can reduce the number of germs on your skin by washing with CHG (chlorahexidine gluconate) soap before surgery.  CHG is an antiseptic cleaner which kills germs and bonds with the skin to continue killing germs even after washing. Please DO NOT use if you have an allergy to CHG or antibacterial soaps.  If your skin becomes reddened/irritated stop using the CHG and inform your nurse when you arrive at Short Stay. Do not shave (including legs and underarms) for at least 48 hours prior to the first CHG shower.  You may shave your face/neck. Please follow these instructions carefully:  1.  Shower with CHG Soap the night before surgery and the  morning of Surgery.  2.  If you choose to wash your hair, wash your hair first as usual with your  normal  shampoo.  3.  After you shampoo, rinse your hair and body thoroughly to remove the  shampoo.  4.  Use CHG as you would any other liquid soap.  You can apply chg directly  to the skin and wash                       Gently with a scrungie or clean washcloth.  5.  Apply the CHG Soap to your body ONLY FROM THE NECK DOWN.   Do not use on face/ open                           Wound or open sores. Avoid contact with eyes, ears mouth and genitals (private parts).                       Wash face,  Genitals (private parts) with your normal soap.             6.  Wash thoroughly, paying special attention to the area where your surgery  will be  performed.  7.  Thoroughly rinse your body with warm water from the neck down.  8.  DO NOT shower/wash with your normal soap after using and rinsing off  the CHG Soap.                9.  Pat yourself dry with a clean towel.            10.  Wear clean pajamas.            11.  Place clean sheets on your bed the night of your first shower and do not  sleep with pets. Day of Surgery : Do not apply any lotions/deodorants the morning of surgery.  Please wear clean clothes to the hospital/surgery center.  FAILURE TO FOLLOW THESE INSTRUCTIONS MAY RESULT IN THE CANCELLATION OF YOUR SURGERY PATIENT SIGNATURE_________________________________  NURSE SIGNATURE__________________________________  ________________________________________________________________________

## 2024-06-15 NOTE — Discharge Instructions (Signed)
 [name], MD [facility]  Please read the following information regarding your care after surgery.  Medications  You only need a prescription for the narcotic pain medicine (ex. oxycodone, Percocet, Norco).  All of the other medicines listed below are available over the counter. ? Aleve 2 pills twice a day for the first 3 days after surgery. ? acetominophen (Tylenol) 650 mg every 4-6 hours as you need for minor to moderate pain ? oxycodone as prescribed for severe pain  ? To help prevent blood clots, take aspirin (81 mg) twice daily for 28 days after surgery.  You should also get up every hour while you are awake to move around.  Weight Bearing ? OK to walk on the operative leg only AFTER the nerve block has completely worn off.  Cast / Splint / Dressing ? Keep your dressing clean and dry.  Don't put anything (coat hanger, pencil, etc) down inside of it.  If it gets wet, please notify the office immediately.  Swelling IMPORTANT: It is normal for you to have swelling where you had surgery. To reduce swelling and pain, keep at least 3 pillows under your leg so that your toes are above your nose and your heel is above the level of your hip.  It may be necessary to keep your foot or leg elevated for several weeks.  This is critical to helping your incisions heal and your pain to feel better.  Follow Up Call my office at [phone] when you are discharged from the hospital or surgery center to schedule an appointment to be seen within 7-10 days after surgery.  Call my office at [phone] if you develop a fever >101.5 F, nausea, vomiting, bleeding from the surgical site or severe pain.   Post Anesthesia Home Care Instructions  Activity: Get plenty of rest for the remainder of the day. A responsible individual must stay with you for 24 hours following the procedure.  For the next 24 hours, DO NOT: -Drive a car -Advertising copywriter -Drink alcoholic beverages -Take any  medication unless instructed by your physician -Make any legal decisions or sign important papers.  Meals: Start with liquid foods such as gelatin or soup. Progress to regular foods as tolerated. Avoid greasy, spicy, heavy foods. If nausea and/or vomiting occur, drink only clear liquids until the nausea and/or vomiting subsides. Call your physician if vomiting continues.  Special Instructions/Symptoms: Your throat may feel dry or sore from the anesthesia or the breathing tube placed in your throat during surgery. If this causes discomfort, gargle with warm salt water. The discomfort should disappear within 24 hours.  If you had a scopolamine patch placed behind your ear for the management of post- operative nausea and/or vomiting:  1. The medication in the patch is effective for 72 hours, after which it should be removed.  Wrap patch in a tissue and discard in the trash. Wash hands thoroughly with soap and water. 2. You may remove the patch earlier than 72 hours if you experience unpleasant side effects which may include dry mouth, dizziness or visual disturbances. 3. Avoid touching the patch. Wash your hands with soap and water after contact with the patch.    Post Anesthesia Home Care Instructions  Activity: Get plenty of rest for the remainder of the day. A responsible individual must stay with you for 24 hours following the procedure.  For the next 24 hours, DO NOT: -Drive a car -Advertising copywriter -Drink alcoholic beverages -Take any medication unless instructed by your

## 2024-06-15 NOTE — Progress Notes (Addendum)
 Anesthesia Review:  PCP: Cardiologist :  PPM/ ICD: Device Orders: Rep Notified:  Chest x-ray : CT Chest- 08/20/23  EKG : Echo : echo 2023  Stress test: Cardiac Cath :   Activity level:  Sleep Study/ CPAP : Fasting Blood Sugar :      / Checks Blood Sugar -- times a day:    Blood Thinner/ Instructions /Last Dose: ASA / Instructions/ Last Dose :   \On 06/15/2024 med hx and preop  instructons completed via phone call.  PT to come in on 06/16/24 at 200pm for labs.

## 2024-06-16 ENCOUNTER — Encounter (HOSPITAL_COMMUNITY)
Admission: RE | Admit: 2024-06-16 | Discharge: 2024-06-16 | Disposition: A | Discharge status: Home / Self Care | Source: Ambulatory Visit | Attending: Orthopaedic Surgery | Admitting: Orthopaedic Surgery

## 2024-06-16 ENCOUNTER — Other Ambulatory Visit (HOSPITAL_COMMUNITY)

## 2024-06-16 VITALS — BP 120/66 | HR 71 | Temp 98.0°F | Resp 16 | Ht 60.0 in | Wt 96.3 lb

## 2024-06-16 DIAGNOSIS — Z9981 Dependence on supplemental oxygen: Secondary | ICD-10-CM | POA: Insufficient documentation

## 2024-06-16 DIAGNOSIS — F4310 Post-traumatic stress disorder, unspecified: Secondary | ICD-10-CM | POA: Insufficient documentation

## 2024-06-16 DIAGNOSIS — M26609 Unspecified temporomandibular joint disorder, unspecified side: Secondary | ICD-10-CM | POA: Diagnosis not present

## 2024-06-16 DIAGNOSIS — F17210 Nicotine dependence, cigarettes, uncomplicated: Secondary | ICD-10-CM | POA: Insufficient documentation

## 2024-06-16 DIAGNOSIS — R0902 Hypoxemia: Secondary | ICD-10-CM | POA: Diagnosis not present

## 2024-06-16 DIAGNOSIS — F410 Panic disorder [episodic paroxysmal anxiety] without agoraphobia: Secondary | ICD-10-CM | POA: Insufficient documentation

## 2024-06-16 DIAGNOSIS — G9589 Other specified diseases of spinal cord: Secondary | ICD-10-CM | POA: Diagnosis not present

## 2024-06-16 DIAGNOSIS — I2510 Atherosclerotic heart disease of native coronary artery without angina pectoris: Secondary | ICD-10-CM | POA: Diagnosis not present

## 2024-06-16 DIAGNOSIS — F329 Major depressive disorder, single episode, unspecified: Secondary | ICD-10-CM | POA: Insufficient documentation

## 2024-06-16 DIAGNOSIS — J449 Chronic obstructive pulmonary disease, unspecified: Secondary | ICD-10-CM | POA: Diagnosis not present

## 2024-06-16 DIAGNOSIS — K219 Gastro-esophageal reflux disease without esophagitis: Secondary | ICD-10-CM | POA: Diagnosis not present

## 2024-06-16 DIAGNOSIS — X58XXXD Exposure to other specified factors, subsequent encounter: Secondary | ICD-10-CM | POA: Insufficient documentation

## 2024-06-16 DIAGNOSIS — S82841D Displaced bimalleolar fracture of right lower leg, subsequent encounter for closed fracture with routine healing: Secondary | ICD-10-CM | POA: Insufficient documentation

## 2024-06-16 DIAGNOSIS — Z01818 Encounter for other preprocedural examination: Secondary | ICD-10-CM | POA: Insufficient documentation

## 2024-06-16 HISTORY — DX: Gastro-esophageal reflux disease without esophagitis: K21.9

## 2024-06-16 HISTORY — DX: Pneumonia, unspecified organism: J18.9

## 2024-06-16 HISTORY — DX: Malignant (primary) neoplasm, unspecified: C80.1

## 2024-06-16 LAB — BASIC METABOLIC PANEL WITH GFR
Anion gap: 9 (ref 5–15)
BUN: 14 mg/dL (ref 8–23)
CO2: 28 mmol/L (ref 22–32)
Calcium: 9.2 mg/dL (ref 8.9–10.3)
Chloride: 102 mmol/L (ref 98–111)
Creatinine, Ser: 0.71 mg/dL (ref 0.44–1.00)
GFR, Estimated: >60 mL/min (ref >60)
Glucose, Bld: 89 mg/dL (ref 70–99)
Potassium: 3.9 mmol/L (ref 3.5–5.1)
Sodium: 139 mmol/L (ref 135–145)

## 2024-06-16 LAB — CBC
HCT: 46.7 % — ABNORMAL HIGH (ref 36.0–46.0)
Hemoglobin: 15.2 g/dL — ABNORMAL HIGH (ref 12.0–15.0)
MCH: 32.5 pg (ref 26.0–34.0)
MCHC: 32.5 g/dL (ref 30.0–36.0)
MCV: 100 fL (ref 80.0–100.0)
Platelets: 214 10*3/uL (ref 150–400)
RBC: 4.67 MIL/uL (ref 3.87–5.11)
RDW: 13.7 % (ref 11.5–15.5)
WBC: 6.8 10*3/uL (ref 4.0–10.5)
nRBC: 0 % (ref 0.0–0.2)

## 2024-06-17 ENCOUNTER — Encounter (HOSPITAL_COMMUNITY): Payer: Self-pay

## 2024-06-17 NOTE — Anesthesia Preprocedure Evaluation (Addendum)
 Anesthesia Evaluation  Patient identified by MRN, date of birth, ID band Patient awake    Reviewed: Allergy & Precautions, NPO status , Patient's Chart, lab work & pertinent test results  Airway Mallampati: II  TM Distance: >3 FB Neck ROM: Full    Dental  (+) Teeth Intact, Dental Advisory Given   Pulmonary COPD,  COPD inhaler, former smoker    + decreased breath sounds      Cardiovascular negative cardio ROS  Rhythm:Regular Rate:Normal     Neuro/Psych  PSYCHIATRIC DISORDERS Anxiety Depression    negative neurological ROS     GI/Hepatic Neg liver ROS,GERD  ,,  Endo/Other  negative endocrine ROS    Renal/GU negative Renal ROS     Musculoskeletal negative musculoskeletal ROS (+)    Abdominal Normal abdominal exam  (+)   Peds  Hematology negative hematology ROS (+)   Anesthesia Other Findings   Reproductive/Obstetrics                              Anesthesia Physical Anesthesia Plan  ASA: 3  Anesthesia Plan: General   Post-op Pain Management: Tylenol  PO (pre-op)* and Gabapentin  PO (pre-op)*   Induction: Intravenous  PONV Risk Score and Plan: 2 and Ondansetron , Midazolam  and Treatment may vary due to age or medical condition  Airway Management Planned: LMA  Additional Equipment: None  Intra-op Plan:   Post-operative Plan: Extubation in OR  Informed Consent: I have reviewed the patients History and Physical, chart, labs and discussed the procedure including the risks, benefits and alternatives for the proposed anesthesia with the patient or authorized representative who has indicated his/her understanding and acceptance.       Plan Discussed with: CRNA  Anesthesia Plan Comments: (See PAT note from 7/2)         Anesthesia Quick Evaluation

## 2024-06-17 NOTE — Progress Notes (Addendum)
 Case: [id number] Date/Time: 06/23/24 0815   Procedure: REMOVAL, HARDWARE (Right)   Anesthesia type: General   Diagnosis: Closed bimalleolar fracture of right ankle with routine healing, subsequent encounter [S82.841D]   Pre-op diagnosis: Closed bimalleolar fracture of right ankle   Location: [facility] ROOM 08 / [facility]   Surgeons: Barton Drape, MD       DISCUSSION: [name] is a 68 yo female who presents to PAT prior to surgery above. PMH of current smoking, CAD (by CT), COPD, nocturnal hypoxia (on 3L at bedtime), cervical myelomalacia, right eye blindness, GERD, TMJ d/o, PTSD, depression, panic d/o.  Patient follows with Neurology at [facility] for cervical myelomalacia. Has previously been told she's is not a surgical candidate. Symptoms include head drop, dropping items with hands, and falls. Patient advised RN in pre op that she is unable to lie flat although patient was noted to tolerate supine position during 09/2023 surgery  Patient follows with Pulmonology at [facility]. Seen by Dr. Shellia on 09/15/23 prior to ankle surgery in 09/2023. COPD was stable at that visit and she was cleared. She uses 3L of O2 at night due to nocturnal hypoxia on sleep study.  VS: BP 120/66 (BP Location: Right Arm)   Pulse 71   Temp 36.7 C (Oral)   Resp 16   Ht 5' (1.524 m)   Wt 43.7 kg   SpO2 100%   BMI 18.81 kg/m   PROVIDERS: Katrinka Aquas, MD   LABS: Labs reviewed: Acceptable for surgery. (all labs ordered are listed, but only abnormal results are displayed)  Labs Reviewed  CBC - Abnormal; Notable for the following components:      Result Value   Hemoglobin 15.2 (*)    HCT 46.7 (*)    All other components within normal limits  BASIC METABOLIC PANEL WITH GFR     IMAGES:  CT Chest 08/12/23:  IMPRESSION: 1. Lung-RADS 2, benign appearance or behavior. Continue annual screening with low-dose chest CT without contrast in 12 months. 2. Aortic atherosclerosis (ICD10-I70.0). Left anterior  descending coronary artery calcification. 3.  Emphysema (ICD10-J43.9).   MRI C-spine 07/29/2021:  IMPRESSION: No cord signal abnormality. Reversal of the normal cervical spine lordosis at C5-C6, but no evidence of spinal canal or foraminal stenosis.  EKG 06/16/24:  Normal sinus rhythm, rate 69 Cannot rule out Anterior infarct , age undetermined  CV:  Echo 04/26/2022:  IMPRESSIONS    1. Left ventricular ejection fraction, by estimation, is 65 to 70%. The left ventricle has normal function. The left ventricle has no regional wall motion abnormalities. Left ventricular diastolic parameters are consistent with Grade I diastolic dysfunction (impaired relaxation).  2. Right ventricular systolic function is normal. The right ventricular size is normal. Tricuspid regurgitation signal is inadequate for assessing PA pressure.  3. The mitral valve is normal in structure. No evidence of mitral valve regurgitation.  4. The aortic valve is normal in structure. Aortic valve regurgitation is not visualized.  5. The inferior vena cava is normal in size with greater than 50% respiratory variability, suggesting right atrial pressure of 3 mmHg.  Comparison(s): No prior Echocardiogram. 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   COPD (chronic obstructive pulmonary disease) (HCC)    Depression    Diverticulosis    GERD (gastroesophageal reflux disease)    IBS (irritable bowel syndrome)    Legally blind in right eye, as  defined in [place]     Myelomalacia (HCC)    Oxygen  dependent    uses O2 at 3L/ at night   Panic disorder    Panic disorder    Pneumonia    as a child   PTSD (post-traumatic stress disorder)    PTSD (post-traumatic stress disorder) 01/08/2019   Temporomandibular joint disorder (TMJ)    age 11 fractured TMJ - repaired by oral surgeon    Past Surgical History:  Procedure Laterality  Date   APPENDECTOMY     CHOLECYSTECTOMY     EYE SURGERY     FRACTURE SURGERY     HARDWARE REMOVAL Right 10/08/2023   Procedure: HARDWARE REMOVAL, right ankle;  Surgeon: Barton Drape, MD;  Location: [facility];  Service: Orthopedics;  Laterality: Right;   LAPAROSCOPIC APPENDECTOMY N/A 01/08/2019   Procedure: APPENDECTOMY LAPAROSCOPIC;  Surgeon: Kallie Manuelita BROCKS, MD;  Location: [facility];  Service: General;  Laterality: N/A;   ORIF ANKLE FRACTURE Right 04/26/2022   Procedure: OPEN REDUCTION INTERNAL FIXATION (ORIF) ANKLE FRACTURE;  Surgeon: Barton Drape, MD;  Location: [facility];  Service: Orthopedics;  Laterality: Right;    MEDICATIONS:  acetaminophen  (TYLENOL ) 500 MG tablet   albuterol  (PROVENTIL ) (2.5 MG/3ML) 0.083% nebulizer solution   albuterol  (VENTOLIN  HFA) 108 (90 Base) MCG/ACT inhaler   Azelastine  HCl 137 MCG/SPRAY SOLN   BREZTRI  AEROSPHERE 160-9-4.8 MCG/ACT AERO   carboxymethylcellulose (REFRESH PLUS) 0.5 % SOLN   Cholecalciferol (VITAMIN D3) 125 MCG (5000 UT) CAPS   clonazePAM  (KLONOPIN ) 1 MG tablet   cyclobenzaprine  (FLEXERIL ) 5 MG tablet   fluticasone  (FLONASE ) 50 MCG/ACT nasal spray   gabapentin  (NEURONTIN ) 300 MG capsule   loperamide  (IMODIUM ) 2 MG capsule   oxyCODONE -acetaminophen  (PERCOCET) 10-325 MG tablet   OXYGEN    White Petrolatum -Mineral Oil (SYSTANE NIGHTTIME) OINT   No current facility-administered medications for this encounter.    Burnard CHRISTELLA Odis DEVONNA [facility] Short Stay/Anesthesiology [facility] Phone [phone] 06/17/2024 9:44 AM

## 2024-06-22 ENCOUNTER — Emergency Department (HOSPITAL_COMMUNITY)

## 2024-06-22 ENCOUNTER — Encounter (HOSPITAL_COMMUNITY): Payer: Self-pay | Admitting: Emergency Medicine

## 2024-06-22 ENCOUNTER — Ambulatory Visit (HOSPITAL_COMMUNITY)
Admission: EM | Admit: 2024-06-22 | Discharge: 2024-06-23 | Disposition: A | Discharge status: Home / Self Care | Attending: Emergency Medicine | Admitting: Emergency Medicine

## 2024-06-22 DIAGNOSIS — S82841A Displaced bimalleolar fracture of right lower leg, initial encounter for closed fracture: Secondary | ICD-10-CM | POA: Diagnosis not present

## 2024-06-22 DIAGNOSIS — K219 Gastro-esophageal reflux disease without esophagitis: Secondary | ICD-10-CM | POA: Insufficient documentation

## 2024-06-22 DIAGNOSIS — S8011XA Contusion of right lower leg, initial encounter: Secondary | ICD-10-CM | POA: Diagnosis not present

## 2024-06-22 DIAGNOSIS — Z9981 Dependence on supplemental oxygen: Secondary | ICD-10-CM | POA: Insufficient documentation

## 2024-06-22 DIAGNOSIS — Z4589 Encounter for adjustment and management of other implanted devices: Secondary | ICD-10-CM | POA: Diagnosis not present

## 2024-06-22 DIAGNOSIS — J449 Chronic obstructive pulmonary disease, unspecified: Secondary | ICD-10-CM | POA: Insufficient documentation

## 2024-06-22 DIAGNOSIS — W010XXA Fall on same level from slipping, tripping and stumbling without subsequent striking against object, initial encounter: Secondary | ICD-10-CM | POA: Insufficient documentation

## 2024-06-22 DIAGNOSIS — S93401A Sprain of unspecified ligament of right ankle, initial encounter: Secondary | ICD-10-CM | POA: Diagnosis present

## 2024-06-22 DIAGNOSIS — Z01818 Encounter for other preprocedural examination: Secondary | ICD-10-CM | POA: Diagnosis present

## 2024-06-22 DIAGNOSIS — Y92009 Unspecified place in unspecified non-institutional (private) residence as the place of occurrence of the external cause: Secondary | ICD-10-CM | POA: Insufficient documentation

## 2024-06-22 DIAGNOSIS — F17210 Nicotine dependence, cigarettes, uncomplicated: Secondary | ICD-10-CM | POA: Insufficient documentation

## 2024-06-22 MED ORDER — CYCLOBENZAPRINE HCL 10 MG PO TABS
5.0000 mg | ORAL_TABLET | Freq: Two times a day (BID) | ORAL | Status: DC
  Administered 2024-06-23: 5 mg via ORAL
  Filled 2024-06-22: qty 1

## 2024-06-22 MED ORDER — BUDESON-GLYCOPYRROL-FORMOTEROL 160-9-4.8 MCG/ACT IN AERO
2.0000 | INHALATION_SPRAY | Freq: Two times a day (BID) | RESPIRATORY_TRACT | Status: DC
  Filled 2024-06-22: qty 5.9

## 2024-06-22 MED ORDER — ALBUTEROL SULFATE (2.5 MG/3ML) 0.083% IN NEBU
2.5000 mg | INHALATION_SOLUTION | Freq: Four times a day (QID) | RESPIRATORY_TRACT | Status: DC | PRN
  Administered 2024-06-23: 2.5 mg via RESPIRATORY_TRACT
  Filled 2024-06-22: qty 3

## 2024-06-22 MED ORDER — CLONAZEPAM 0.5 MG PO TABS
0.5000 mg | ORAL_TABLET | Freq: Every day | ORAL | Status: DC
  Administered 2024-06-23: 0.5 mg via ORAL
  Filled 2024-06-22: qty 1

## 2024-06-22 MED ORDER — LOPERAMIDE HCL 2 MG PO CAPS
4.0000 mg | ORAL_CAPSULE | Freq: Once | ORAL | Status: AC
  Administered 2024-06-23: 4 mg via ORAL
  Filled 2024-06-22: qty 2

## 2024-06-22 MED ORDER — OXYCODONE-ACETAMINOPHEN 10-325 MG PO TABS: 1.0000 | ORAL_TABLET | Freq: Four times a day (QID) | ORAL | Status: DC | PRN

## 2024-06-22 MED ORDER — GABAPENTIN 300 MG PO CAPS
900.0000 mg | ORAL_CAPSULE | Freq: Every day | ORAL | Status: DC
  Administered 2024-06-23: 900 mg via ORAL
  Filled 2024-06-22: qty 3

## 2024-06-22 NOTE — ED Triage Notes (Signed)
 Pt arriving with right ankle pain. Pt reports she broke her right ankle in 2 places in 2022, had metal placed in the ankle to stabilize it. She is scheduled for surgery in the morning but tripped earlier and hit the back of her right leg. Pt has bruising on back of her right ankle/lower leg. Pt states her surgeon told her to come to the ER to have it checked out and wants her to be admitted so she will be here for surgery in the morning.

## 2024-06-22 NOTE — Care Plan (Addendum)
 Orthopaedic Surgery Plan of Care Note   -pt called into office help line today evening reporting a fall with bruising -referred to [facility] ER for imaging as surgery is tomorrow 06/22/24 -if imaging workup negative, please board in ER overnight for surgery in the AM if pt agrees to proceed with surgery followed by planned discharge home from PACU (she is first round case). Alternatively ok to discharge home with return to [facility] for surgery in the AM if that is what the patient prefers -please keep NPO and hold VTE ppx from midnight -if pt keen on perioperative admission, would request hospitalist service as she has historically been managed inpatient by them due to COPD with oxygen  needs among other comorbidities   Lillia Mountain, MD Orthopaedic Surgery [facility]

## 2024-06-22 NOTE — Discharge Instructions (Addendum)
 FOLLOW UP PLAN: -transfer to PACU, then home -strict NWB operative extremity until nerve block wears off, then OK to WBAT, maximum elevation -maintain dressings until follow up -DVT ppx: Aspirin 81 mg twice daily -follow up as outpatient within 7-10 days for wound check -sutures out in 2-3 weeks in outpatient office

## 2024-06-22 NOTE — H&P (Signed)
 ORTHOPAEDIC SURGERY H&P  Subjective:  The patient presents for right ankle symptomatic hardware.   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   COPD (chronic obstructive pulmonary disease) (HCC)    Depression    Diverticulosis    GERD (gastroesophageal reflux disease)    IBS (irritable bowel syndrome)    Legally blind in right eye, as defined in [place]     Myelomalacia (HCC)    Oxygen  dependent    uses O2 at 3L/[place] at night   Panic disorder    Panic disorder    Pneumonia    as a child   PTSD (post-traumatic stress disorder)    PTSD (post-traumatic stress disorder) 01/08/2019   Temporomandibular joint disorder (TMJ)    age 21 fractured TMJ - repaired by oral surgeon    Past Surgical History:  Procedure Laterality Date   APPENDECTOMY     CHOLECYSTECTOMY     EYE SURGERY     FRACTURE SURGERY     HARDWARE REMOVAL Right 10/08/2023   Procedure: HARDWARE REMOVAL, right ankle;  Surgeon: Barton Drape, MD;  Location: [facility];  Service: Orthopedics;  Laterality: Right;   LAPAROSCOPIC APPENDECTOMY N/A 01/08/2019   Procedure: APPENDECTOMY LAPAROSCOPIC;  Surgeon: Kallie Manuelita BROCKS, MD;  Location: [facility];  Service: General;  Laterality: N/A;   ORIF ANKLE FRACTURE Right 04/26/2022   Procedure: OPEN REDUCTION INTERNAL FIXATION (ORIF) ANKLE FRACTURE;  Surgeon: Barton Drape, MD;  Location[facility];  Service: Orthopedics;  Laterality: Right;     (Not in an outpatient encounter)    Allergies  Allergen Reactions   Clindamycin  Nausea And Vomiting and Rash   Augmentin  [Amoxicillin -Pot Clavulanate] Other (See Comments)    GI upset   Ketamine     Pt preference    Naltrexone Other (See Comments)    Hallucinations, sleepiness, withdrawal s/s, poor apepitie    Nsaids     Advised not to take    Toradol  [Ketorolac  Tromethamine ] Nausea And Vomiting and Swelling    TONGUE SWELLING    Tramadol  Nausea And Vomiting   Atorvastatin Other (See Comments), Itching, Nausea Only and Tinitus   Benadryl [Diphenhydramine Hcl] Palpitations    Insomnia   Ceftin [Cefuroxime Axetil] Rash   Compazine Palpitations    Insomnia   Epinephrine  Palpitations    Insomina    Social History   Socioeconomic History   Marital status: Married    Spouse name: Not on file   Number of children: Not on file   Years of education: Not on file   Highest education level: Not on file  Occupational History   Not on file  Tobacco Use   Smoking status: Every Day    Current packs/day: 1.00    Average packs/day: 1 pack/day for 52.0 years (52.0 ttl pk-yrs)    Types: Cigarettes   Smokeless tobacco: Never  Vaping Use   Vaping status: Never Used  Substance and Sexual Activity   Alcohol  use: No   Drug use: No   Sexual activity: Yes    Birth control/protection: Post-menopausal  Other Topics Concern   Not on file  Social History Narrative   Not on file   Social Drivers of Health   Financial Resource Strain: Not on file  Food Insecurity: Not on file  Transportation Needs: Not on file  Physical Activity: Not on file  Stress: Not on file  Social  Connections: Not on file  Intimate Partner Violence: Not on file     History reviewed. No pertinent family history.   Review of Systems Pertinent items are noted in HPI.  Objective: Vital signs in last 24 hours:    06/16/2024    1:56 PM 10/08/2023   11:47 AM 10/08/2023    7:48 AM  Vitals with BMI  Height 5' 0  5' 0  Weight 96 lbs 5 oz  102 lbs 5 oz  BMI 18.81  19.98  Systolic 120 102 874  Diastolic 66 60 75  Pulse 71 80 82      EXAM: General: Well nourished, well developed. Awake, alert and oriented to time, place, person. Normal mood and affect. No apparent distress. Breathing room air.  Operative Lower Extremity: Alignment - Neutral Deformity - None Skin intact Tenderness to palpation - right ankle 5/5 TA, PT, GS, Per,  EHL, FHL Sensation intact to light touch throughout Palpable DP and PT pulses Special testing: None  The contralateral foot/ankle was examined for comparison and noted to be neurovascularly intact with no localized deformity, swelling, or tenderness.  Imaging Review All images taken were independently reviewed by me.  Assessment/Plan: The clinical and radiographic findings were reviewed and discussed at length with the patient.  The patient presents for right ankle symptomatic hardware.  We spoke at length about the natural course of these findings. We discussed nonoperative and operative treatment options in detail.  The risks and benefits were presented and reviewed. The risks due to suture/hardware failure/irritation (or if removing hardware inability to remove part/all of hardware, recurrent instability), new/persistent/recurrent infection, stiffness, nerve/vessel/tendon injury, nonunion/malunion of any fracture, wound healing issues, allograft usage, development of arthritis, failure of this surgery, possibility of external fixation in certain situations, possibility of delayed definitive surgery, need for further surgery, prolonged wound care including further soft tissue coverage procedures, thromboembolic events, anesthesia/medical complications/events perioperatively and beyond, amputation, death among others were discussed. The patient acknowledged the explanation and agreed to proceed with the plan.  [name]  Orthopaedic Surgery [facility]

## 2024-06-22 NOTE — ED Provider Notes (Signed)
 [place] EMERGENCY DEPARTMENT AT [facility] Provider Note   CSN: [id number] Arrival date & time: 06/22/24  2227     Patient presents with: Ankle Pain (Right)   [name] is a 68 y.o. female.  {Add pertinent medical, surgical, social history, OB history to YEP:[id number]} The history is provided by the patient.  Ankle Pain [name] is a 68 y.o. female who presents to the Emergency Department complaining of ***  Tripped over wooden folding chair this morning.  No head injury.  Chair folded on top of right ankle. Scheduled for surgery in morning.      Prior to Admission medications   Medication Sig Start Date End Date Taking? Authorizing Provider  acetaminophen  (TYLENOL ) 500 MG tablet Take 1,000 mg by mouth every 8 (eight) hours as needed (pain).    [provider]  albuterol  (PROVENTIL ) (2.5 MG/3ML) 0.083% nebulizer solution Take 3 mLs (2.5 mg total) by nebulization every 6 (six) hours as needed for wheezing or shortness of breath. 11/12/22   Sood, Vineet, MD  albuterol  (VENTOLIN  HFA) 108 (90 Base) MCG/ACT inhaler Inhale 1-2 puffs into the lungs every 6 (six) hours as needed for wheezing or shortness of breath.    [provider]  Azelastine  HCl 137 MCG/SPRAY SOLN Place 1 spray into the nose daily as needed (for nasal congestion).  10/27/18   [provider]  BREZTRI  AEROSPHERE 160-9-4.8 MCG/ACT AERO inhale TWO PUFFS TWICE DAILY morning AND bedtime 06/05/23   Sood, Vineet, MD  carboxymethylcellulose (REFRESH PLUS) 0.5 % SOLN Place 2-4 drops into both eyes as needed (dry eyes).    [provider]  Cholecalciferol (VITAMIN D3) 125 MCG (5000 UT) CAPS Take 1 capsule by mouth in the morning and at bedtime. Taking a day    [provider]  clonazePAM  (KLONOPIN ) 1 MG tablet Take 0.5 tablets (0.5 mg total) by mouth 2 (two) times daily as needed for anxiety. *May take one additional tablet as needed for anxiety/PTSD Patient taking  differently: Take 1 mg by mouth in the morning, at noon, and at bedtime. 07/08/19   Pearlean Manus, MD  cyclobenzaprine  (FLEXERIL ) 5 MG tablet Take 5 mg by mouth 2 (two) times daily.    [provider]  fluticasone  (FLONASE ) 50 MCG/ACT nasal spray Place 2 sprays into both nostrils 2 (two) times daily as needed for allergies.    [provider]  gabapentin  (NEURONTIN ) 300 MG capsule Take 600-900 mg by mouth See admin instructions. 900 mg in the morning, 600 mg midday, 900 mg at bedtime    [provider]  loperamide  (IMODIUM ) 2 MG capsule Take 4 mg by mouth daily. And then as needed    [provider]  oxyCODONE -acetaminophen  (PERCOCET) 10-325 MG tablet Take 1 tablet by mouth 4 (four) times daily.    [provider]  OXYGEN  Inhale 3 L into the lungs at bedtime.    [provider]  White Petrolatum -Mineral Oil (SYSTANE NIGHTTIME) OINT Place 1 Application into both eyes at bedtime.    [provider]    Allergies: Clindamycin , Augmentin  [amoxicillin -pot clavulanate], Ketamine, Naltrexone, Nsaids, Toradol  [ketorolac  tromethamine ], Tramadol , Atorvastatin, Benadryl [diphenhydramine hcl], Ceftin [cefuroxime axetil], Compazine, and Epinephrine     Review of Systems  All other systems reviewed and are negative.   Updated Vital Signs BP 127/64 (BP Location: Left Arm)   Pulse 99   Temp 98.2 F (36.8 C) (Oral)   Resp 18   SpO2 93%   Physical Exam  Vitals and nursing note reviewed.  Constitutional:      Appearance: She is well-developed.  HENT:     Head: Normocephalic and atraumatic.  Cardiovascular:     Rate and Rhythm: Normal rate and regular rhythm.  Pulmonary:     Effort: Pulmonary effort is normal. No respiratory distress.  Abdominal:     Palpations: Abdomen is soft.     Tenderness: There is no abdominal tenderness. There is no guarding or rebound.  Musculoskeletal:     Comments: 2+ DP pulses bilaterally.  Mild ttp over right  ankles.  Flexion extension intact at ankle, knee.    Skin:    General: Skin is warm and dry.  Neurological:     Mental Status: She is alert and oriented to person, place, and time.  Psychiatric:        Behavior: Behavior normal.     (all labs ordered are listed, but only abnormal results are displayed) Labs Reviewed - No data to display  EKG: None  Radiology: No results found.  {Document cardiac monitor, telemetry assessment procedure when appropriate:32947} Procedures   Medications Ordered in the ED - No data to display    {Click here for ABCD2, HEART and other calculators REFRESH Note before signing:1}                              Medical Decision Making Amount and/or Complexity of Data Reviewed Radiology: ordered.   ***  {Document critical care time when appropriate  Document review of labs and clinical decision tools ie CHADS2VASC2, etc  Document your independent review of radiology images and any outside records  Document your discussion with family members, caretakers and with consultants  Document social determinants of health affecting pt's care  Document your decision making why or why not admission, treatments were needed:32947:::1}   Final diagnoses:  None    ED Discharge Orders     None

## 2024-06-23 ENCOUNTER — Encounter (HOSPITAL_COMMUNITY): Admitting: Physician Assistant

## 2024-06-23 ENCOUNTER — Encounter (HOSPITAL_COMMUNITY)
Admission: EM | Disposition: A | Discharge status: Home / Self Care | Payer: Self-pay | Source: Home / Self Care | Attending: Emergency Medicine

## 2024-06-23 ENCOUNTER — Encounter (HOSPITAL_COMMUNITY): Payer: Self-pay

## 2024-06-23 ENCOUNTER — Ambulatory Visit (HOSPITAL_COMMUNITY): Admission: RE | Admit: 2024-06-23 | Source: Home / Self Care | Admitting: Orthopaedic Surgery

## 2024-06-23 ENCOUNTER — Emergency Department (HOSPITAL_COMMUNITY): Payer: Self-pay | Admitting: Medical

## 2024-06-23 DIAGNOSIS — Z87891 Personal history of nicotine dependence: Secondary | ICD-10-CM

## 2024-06-23 DIAGNOSIS — J439 Emphysema, unspecified: Secondary | ICD-10-CM | POA: Diagnosis not present

## 2024-06-23 DIAGNOSIS — S82841A Displaced bimalleolar fracture of right lower leg, initial encounter for closed fracture: Secondary | ICD-10-CM

## 2024-06-23 DIAGNOSIS — F418 Other specified anxiety disorders: Secondary | ICD-10-CM | POA: Diagnosis not present

## 2024-06-23 HISTORY — PX: HARDWARE REMOVAL: SHX979

## 2024-06-23 HISTORY — DX: Dependence on supplemental oxygen: Z99.81

## 2024-06-23 SURGERY — REMOVAL, HARDWARE
Anesthesia: General | Laterality: Right

## 2024-06-23 MED ORDER — OXYCODONE HCL 5 MG PO TABS: 5.0000 mg | ORAL_TABLET | Freq: Four times a day (QID) | ORAL | Status: DC | PRN

## 2024-06-23 MED ORDER — CHLORHEXIDINE GLUCONATE 0.12 % MT SOLN
15.0000 mL | Freq: Once | OROMUCOSAL | Status: AC
  Administered 2024-06-23: 15 mL via OROMUCOSAL

## 2024-06-23 MED ORDER — LEVOFLOXACIN IN D5W 500 MG/100ML IV SOLN
500.0000 mg | INTRAVENOUS | Status: AC
  Administered 2024-06-23: 500 mg via INTRAVENOUS
  Filled 2024-06-23: qty 100

## 2024-06-23 MED ORDER — MIDAZOLAM HCL 5 MG/5ML IJ SOLN
INTRAMUSCULAR | Status: DC | PRN
  Administered 2024-06-23 (×2): 1 mg via INTRAVENOUS

## 2024-06-23 MED ORDER — LIDOCAINE HCL (PF) 2 % IJ SOLN
INTRAMUSCULAR | Status: AC
  Filled 2024-06-23: qty 5

## 2024-06-23 MED ORDER — OXYCODONE HCL 5 MG PO TABS
5.0000 mg | ORAL_TABLET | Freq: Once | ORAL | Status: AC | PRN
  Administered 2024-06-23: 5 mg via ORAL

## 2024-06-23 MED ORDER — OXYCODONE-ACETAMINOPHEN 5-325 MG PO TABS
1.0000 | ORAL_TABLET | Freq: Four times a day (QID) | ORAL | Status: DC | PRN
  Administered 2024-06-23: 1 via ORAL
  Filled 2024-06-23: qty 1

## 2024-06-23 MED ORDER — HYDROMORPHONE HCL 1 MG/ML IJ SOLN
0.2500 mg | INTRAMUSCULAR | Status: DC | PRN
  Administered 2024-06-23 (×2): 0.5 mg via INTRAVENOUS

## 2024-06-23 MED ORDER — BUPIVACAINE-EPINEPHRINE (PF) 0.5% -1:200000 IJ SOLN
INTRAMUSCULAR | Status: AC
  Filled 2024-06-23: qty 30

## 2024-06-23 MED ORDER — 0.9 % SODIUM CHLORIDE (POUR BTL) OPTIME
TOPICAL | Status: DC | PRN
Start: 2024-06-23 — End: 2024-06-23
  Administered 2024-06-23: 1000 mL

## 2024-06-23 MED ORDER — LIDOCAINE HCL (PF) 2 % IJ SOLN
INTRAMUSCULAR | Status: DC | PRN
  Administered 2024-06-23: 40 mg via INTRADERMAL

## 2024-06-23 MED ORDER — FENTANYL CITRATE (PF) 100 MCG/2ML IJ SOLN
INTRAMUSCULAR | Status: AC
  Filled 2024-06-23: qty 2

## 2024-06-23 MED ORDER — OXYCODONE HCL 5 MG PO TABS
ORAL_TABLET | ORAL | Status: AC
  Filled 2024-06-23: qty 1

## 2024-06-23 MED ORDER — LACTATED RINGERS IV SOLN: INTRAVENOUS | Status: DC

## 2024-06-23 MED ORDER — DEXAMETHASONE SODIUM PHOSPHATE 10 MG/ML IJ SOLN
INTRAMUSCULAR | Status: DC | PRN
  Administered 2024-06-23: 8 mg via INTRAVENOUS

## 2024-06-23 MED ORDER — AMISULPRIDE (ANTIEMETIC) 5 MG/2ML IV SOLN: 10.0000 mg | Freq: Once | INTRAVENOUS | Status: DC | PRN

## 2024-06-23 MED ORDER — MEPERIDINE HCL 50 MG/ML IJ SOLN: 6.2500 mg | INTRAMUSCULAR | Status: DC | PRN

## 2024-06-23 MED ORDER — PROPOFOL 10 MG/ML IV BOLUS
INTRAVENOUS | Status: DC | PRN
  Administered 2024-06-23: 100 mg via INTRAVENOUS

## 2024-06-23 MED ORDER — HYDROMORPHONE HCL 1 MG/ML IJ SOLN
INTRAMUSCULAR | Status: AC
  Filled 2024-06-23: qty 1

## 2024-06-23 MED ORDER — DEXAMETHASONE SODIUM PHOSPHATE 10 MG/ML IJ SOLN
INTRAMUSCULAR | Status: AC
  Filled 2024-06-23: qty 1

## 2024-06-23 MED ORDER — BUPIVACAINE HCL (PF) 0.25 % IJ SOLN
INTRAMUSCULAR | Status: AC
  Filled 2024-06-23: qty 30

## 2024-06-23 MED ORDER — MIDAZOLAM HCL 2 MG/2ML IJ SOLN
INTRAMUSCULAR | Status: AC
  Filled 2024-06-23: qty 2

## 2024-06-23 MED ORDER — EPHEDRINE 5 MG/ML INJ
INTRAVENOUS | Status: AC
  Filled 2024-06-23: qty 5

## 2024-06-23 MED ORDER — CHLORHEXIDINE GLUCONATE 4 % EX SOLN: 60.0000 mL | Freq: Once | CUTANEOUS | Status: DC

## 2024-06-23 MED ORDER — PROPOFOL 10 MG/ML IV BOLUS
INTRAVENOUS | Status: AC
  Filled 2024-06-23: qty 20

## 2024-06-23 MED ORDER — BUPIVACAINE-EPINEPHRINE (PF) 0.5% -1:200000 IJ SOLN
INTRAMUSCULAR | Status: DC | PRN
  Administered 2024-06-23: 10 mL

## 2024-06-23 MED ORDER — VANCOMYCIN HCL IN DEXTROSE 1-5 GM/200ML-% IV SOLN
1000.0000 mg | INTRAVENOUS | Status: AC
  Administered 2024-06-23: 1000 mg via INTRAVENOUS
  Filled 2024-06-23: qty 200

## 2024-06-23 MED ORDER — ONDANSETRON HCL 4 MG/2ML IJ SOLN
INTRAMUSCULAR | Status: AC
  Filled 2024-06-23: qty 2

## 2024-06-23 MED ORDER — ONDANSETRON HCL 4 MG/2ML IJ SOLN
INTRAMUSCULAR | Status: DC | PRN
Start: 2024-06-23 — End: 2024-06-23
  Administered 2024-06-23: 4 mg via INTRAVENOUS

## 2024-06-23 MED ORDER — OXYCODONE HCL 5 MG/5ML PO SOLN: 5.0000 mg | Freq: Once | ORAL | Status: AC | PRN

## 2024-06-23 MED ORDER — VANCOMYCIN HCL 500 MG IV SOLR
INTRAVENOUS | Status: DC | PRN
  Administered 2024-06-23: 1000 mg via TOPICAL

## 2024-06-23 MED ORDER — ORAL CARE MOUTH RINSE: 15.0000 mL | Freq: Once | OROMUCOSAL | Status: AC

## 2024-06-23 MED ORDER — HYDROMORPHONE HCL 1 MG/ML IJ SOLN
0.2500 mg | INTRAMUSCULAR | Status: DC | PRN
  Administered 2024-06-23 (×2): 0.5 mg via INTRAVENOUS
  Administered 2024-06-23: 0.25 mg via INTRAVENOUS
  Administered 2024-06-23: 0.5 mg via INTRAVENOUS
  Administered 2024-06-23: 0.25 mg via INTRAVENOUS

## 2024-06-23 MED ORDER — VANCOMYCIN HCL 1000 MG IV SOLR
INTRAVENOUS | Status: AC
Start: 2024-06-23 — End: 2024-06-23
  Filled 2024-06-23: qty 20

## 2024-06-23 MED ORDER — EPHEDRINE SULFATE-NACL 50-0.9 MG/10ML-% IV SOSY
PREFILLED_SYRINGE | INTRAVENOUS | Status: DC | PRN
  Administered 2024-06-23 (×2): 5 mg via INTRAVENOUS

## 2024-06-23 MED ORDER — FENTANYL CITRATE (PF) 100 MCG/2ML IJ SOLN
INTRAMUSCULAR | Status: DC | PRN
  Administered 2024-06-23: 25 ug via INTRAVENOUS

## 2024-06-23 SURGICAL SUPPLY — 53 items
BANDAGE ESMARK 6X9 LF (GAUZE/BANDAGES/DRESSINGS) ×1 IMPLANT
BLADE SURG 15 STRL LF DISP TIS (BLADE) ×4 IMPLANT
BNDG COHESIVE 4X5 TAN STRL LF (GAUZE/BANDAGES/DRESSINGS) ×1 IMPLANT
BNDG ELASTIC 4INX 5YD STR LF (GAUZE/BANDAGES/DRESSINGS) ×1 IMPLANT
BNDG ELASTIC 6INX 5YD STR LF (GAUZE/BANDAGES/DRESSINGS) ×1 IMPLANT
BNDG GAUZE DERMACEA FLUFF 4 (GAUZE/BANDAGES/DRESSINGS) IMPLANT
BRUSH SCRUB EZ 4% CHG (MISCELLANEOUS) ×1 IMPLANT
CHLORAPREP W/TINT 26 (MISCELLANEOUS) ×2 IMPLANT
COVER BACK TABLE 60X90IN (DRAPES) ×1 IMPLANT
CUFF TRNQT CYL 24X4X16.5-23 (TOURNIQUET CUFF) IMPLANT
CUFF TRNQT CYL 34X4.125X (TOURNIQUET CUFF) IMPLANT
DRAPE C-ARM 42X120 X-RAY (DRAPES) ×1 IMPLANT
DRAPE C-ARMOR (DRAPES) ×1 IMPLANT
DRAPE EXTREMITY T 121X128X90 (DISPOSABLE) ×1 IMPLANT
DRAPE IMP U-DRAPE 54X76 (DRAPES) ×1 IMPLANT
DRAPE U-SHAPE 47X51 STRL (DRAPES) ×1 IMPLANT
DRSG MEPITEL 4X7.2 (GAUZE/BANDAGES/DRESSINGS) ×1 IMPLANT
ELECT REM PT RETURN 15FT ADLT (MISCELLANEOUS) ×1 IMPLANT
GAUZE PAD ABD 8X10 STRL (GAUZE/BANDAGES/DRESSINGS) IMPLANT
GAUZE SPONGE 4X4 12PLY STRL (GAUZE/BANDAGES/DRESSINGS) ×1 IMPLANT
GLOVE BIOGEL PI IND STRL 8 (GLOVE) ×1 IMPLANT
GLOVE SURG SS PI 7.5 STRL IVOR (GLOVE) ×2 IMPLANT
GOWN STRL REUS W/ TWL LRG LVL3 (GOWN DISPOSABLE) ×2 IMPLANT
KIT BASIN OR (CUSTOM PROCEDURE TRAY) ×1 IMPLANT
MARKER SKIN DUAL TIP RULER LAB (MISCELLANEOUS) IMPLANT
NDL HYPO 25X1 1.5 SAFETY (NEEDLE) IMPLANT
NEEDLE HYPO 25X1 1.5 SAFETY (NEEDLE) IMPLANT
NS IRRIG 1000ML POUR BTL (IV SOLUTION) ×1 IMPLANT
PAD CAST 4YDX4 CTTN HI CHSV (CAST SUPPLIES) ×1 IMPLANT
PADDING CAST ABS COTTON 4X4 ST (CAST SUPPLIES) IMPLANT
PADDING CAST COTTON 6X4 STRL (CAST SUPPLIES) ×1 IMPLANT
PADDING CAST SYNTHETIC 4X4 STR (CAST SUPPLIES) ×4 IMPLANT
PENCIL SMOKE EVACUATOR (MISCELLANEOUS) ×1 IMPLANT
SHEET MEDIUM DRAPE 40X70 STRL (DRAPES) ×1 IMPLANT
SLEEVE SCD COMPRESS KNEE MED (STOCKING) ×1 IMPLANT
SPIKE FLUID TRANSFER (MISCELLANEOUS) IMPLANT
SPLINT PLASTER CAST XFAST 5X30 (CAST SUPPLIES) IMPLANT
SPONGE T-LAP 18X18 ~~LOC~~+RFID (SPONGE) ×1 IMPLANT
STAPLER SKIN PROX 35W (STAPLE) IMPLANT
STOCKINETTE 6 STRL (DRAPES) ×2 IMPLANT
SUCTION TUBE FRAZIER 10FR DISP (SUCTIONS) IMPLANT
SUT ETHILON 2 0 FS 18 (SUTURE) ×2 IMPLANT
SUT ETHILON 2 0 PS N (SUTURE) IMPLANT
SUT MNCRL AB 3-0 PS2 18 (SUTURE) ×1 IMPLANT
SUT VIC AB 2-0 CT2 27 (SUTURE) IMPLANT
SUT VIC AB 2-0 SH 27XBRD (SUTURE) ×1 IMPLANT
SUT VIC AB 3-0 SH 27X BRD (SUTURE) IMPLANT
SUT VICRYL 0 SH 27 (SUTURE) IMPLANT
SYR BULB IRRIG 60ML STRL (SYRINGE) ×1 IMPLANT
SYR CONTROL 10ML LL (SYRINGE) IMPLANT
TOWEL GREEN STERILE FF (TOWEL DISPOSABLE) ×2 IMPLANT
TUBING CONNECTING 10 (TUBING) ×1 IMPLANT
UNDERPAD 30X36 HEAVY ABSORB (UNDERPADS AND DIAPERS) ×1 IMPLANT

## 2024-06-23 NOTE — Anesthesia Postprocedure Evaluation (Signed)
 Anesthesia Post Note  Patient: [name]  Procedure(s) Performed: REMOVAL, HARDWARE (Right)     Patient location during evaluation: PACU Anesthesia Type: General Level of consciousness: awake and alert Pain management: pain level controlled Vital Signs Assessment: post-procedure vital signs reviewed and stable Respiratory status: spontaneous breathing, nonlabored ventilation and respiratory function stable Cardiovascular status: blood pressure returned to baseline and stable Postop Assessment: no apparent nausea or vomiting Anesthetic complications: no   No notable events documented.  Last Vitals:  Vitals:   06/23/24 1100 06/23/24 1115  BP: 123/64 111/64  Pulse: 67 67  Resp: 10 13  Temp:    SpO2: 97% 93%    Last Pain:  Vitals:   06/23/24 1111  TempSrc:   PainSc: 7                  Butler Levander Pinal

## 2024-06-23 NOTE — Op Note (Addendum)
 06/22/2024 - 06/23/2024  6:09 AM   PATIENT: [name]  68 y.o. female  MRN: [id number]   PRE-OPERATIVE DIAGNOSIS:   Closed bimalleolar fracture of right ankle s/p ORIF with symptomatic orthopaedic hardware   POST-OPERATIVE DIAGNOSIS:   Same   PROCEDURE: Right ankle removal of deep hardware (distal fibula)   SURGEON:  Lillia Mountain, MD   ASSISTANT: None   ANESTHESIA: General, local   EBL: 5 cc   TOURNIQUET:   None used   COMPLICATIONS: None apparent   DISPOSITION: Extubated, awake and stable to recovery.   INDICATION FOR PROCEDURE: The patient presented with above diagnosis.  We discussed the diagnosis, alternative treatment options, risks and benefits of the above surgical intervention, as well as alternative non-operative treatments. All questions/concerns were addressed and the patient/family demonstrated appropriate understanding of the diagnosis, the procedure, the postoperative course, and overall prognosis. The patient wished to proceed with surgical intervention and signed an informed surgical consent as such, in each others presence prior to surgery.   PROCEDURE IN DETAIL: After preoperative consent was obtained and the correct operative site was identified, the patient was brought to the operating room supine on stretcher and transferred onto operating table. General anesthesia was induced. Preoperative antibiotics were administered. Surgical timeout was taken. The patient was then positioned supine with an ipsilateral hip bump. The operative lower extremity was prepped and draped in standard sterile fashion with a tourniquet around the thigh. However, this was never needed to be inflated.  Prior lateral approach was utilized over the distal fibula and dissection carried down to the level of plate. Initially attempt was made to remove plate thru percutaneous incisions to minimize wounds. However, one of the locking screws was completely cold welded  to the plate and therefore we needed to utilize and extend the full lateral ankle incision to get the hardware out. Of note, the skin was extremely and extraordinarily friable with very poor soft tissue quality. All screws and plate were removed completely and stability of the anklewas noted to clinical and fluoroscopic testing.    The surgical sites were thoroughly irrigated. The tourniquet was deflated and hemostasis achieved. Betadine solution was used to irrigate and vancomycin  powder applied. The skin was closed without tension.    The leg was cleaned with saline and sterile mepitel dressings with gauze were applied. A well padded sterile wrap was applied. The patient was awakened from anesthesia and transported to the recovery room in stable condition.    FOLLOW UP PLAN: -transfer to PACU, then home -strict NWB operative extremity until nerve block wears off, then OK to WBAT, maximum elevation -maintain dressings until follow up -DVT ppx: Aspirin 81 mg twice daily -follow up as outpatient within 7-10 days for wound check -sutures out in 2-3 weeks in outpatient office   RADIOGRAPHS: AP, lateral, oblique and stress radiographs of the right ankle were obtained intraoperatively. These showed interval removal of all fibula hardware. Manual stress radiographs were taken and the joints were noted to be stable following hardware removal. No other acute injuries are noted.   Lillia Mountain Orthopaedic Surgery [facility]

## 2024-06-23 NOTE — H&P (Signed)
 H&P Update:  -History and Physical Reviewed  -Patient has been re-examined  -No change in the plan of care  -The risks and benefits were presented and reviewed. The risks due to inability to remove part/all of hardware, recurrent instability, hardware/suture failure and/or irritation, new/persistent infection, stiffness, nerve/vessel/tendon injury or rerupture of repaired tendon, nonunion/malunion, allograft usage, wound healing issues, development of arthritis, failure of this surgery, possibility of external fixation with delayed definitive surgery, need for further surgery, thromboembolic events, anesthesia/medical complications, amputation, death among others were discussed. The patient acknowledged the explanation, agreed to proceed with the plan and a consent was signed.  [name]

## 2024-06-23 NOTE — Anesthesia Procedure Notes (Signed)
 Procedure Name: LMA Insertion Date/Time: 06/23/2024 8:41 AM  Performed by: Memory Armida LABOR, CRNAPre-anesthesia Checklist: Patient identified, Emergency Drugs available, Suction available, Patient being monitored and Timeout performed Patient Re-evaluated:Patient Re-evaluated prior to induction Oxygen  Delivery Method: Simple face mask Preoxygenation: Pre-oxygenation with 100% oxygen  Induction Type: IV induction LMA: LMA with gastric port inserted LMA Size: 3.0 Number of attempts: 1 Placement Confirmation: positive ETCO2 Tube secured with: Tape Dental Injury: Teeth and Oropharynx as per pre-operative assessment

## 2024-06-23 NOTE — Transfer of Care (Signed)
 Immediate Anesthesia Transfer of Care Note  Patient: [name]  Procedure(s) Performed: REMOVAL, HARDWARE (Right)  Patient Location: PACU  Anesthesia Type:General  Level of Consciousness: drowsy and patient cooperative  Airway & Oxygen  Therapy: Patient Spontanous Breathing and Patient connected to face mask oxygen   Post-op Assessment: Report given to RN and Post -op Vital signs reviewed and stable  Post vital signs: Reviewed and stable  Last Vitals:  Vitals Value Taken Time  BP 149/70 06/23/24 10:04  Temp    Pulse 63 06/23/24 10:06  Resp 26 06/23/24 10:06  SpO2 100 % 06/23/24 10:06  Vitals shown include unfiled device data.  Last Pain:  Vitals:   06/23/24 0719  TempSrc: Oral  PainSc:          Complications: No notable events documented.

## 2024-06-24 ENCOUNTER — Encounter (HOSPITAL_COMMUNITY): Payer: Self-pay | Admitting: Orthopaedic Surgery

## 2024-06-24 NOTE — Consult Note (Signed)
 Patient ID: [name] MRN: [id number] DOB/AGE: 68-Dec-1957 68 y.o.  Admit date: 06/22/2024  Admission Diagnoses:  Right leg contusion   HPI: Ortho consult for above issue sustained 06/23/24. Patient reports she was walking at home and tripped over a wooden folding chair. She noticed bruising over posterior leg and was concerned for new injury.  Denies numbness/tingling.  Past Medical History: 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   COPD (chronic obstructive pulmonary disease) (HCC)    Depression    Diverticulosis    GERD (gastroesophageal reflux disease)    IBS (irritable bowel syndrome)    Legally blind in right eye, as defined in [place]     Myelomalacia (HCC)    Oxygen  dependent    uses O2 at 3L/[place] at night   Panic disorder    Panic disorder    Pneumonia    as a child   PTSD (post-traumatic stress disorder)    PTSD (post-traumatic stress disorder) 01/08/2019   Temporomandibular joint disorder (TMJ)    age 68 fractured TMJ - repaired by oral surgeon    Surgical History: Past Surgical History:  Procedure Laterality Date   APPENDECTOMY     CHOLECYSTECTOMY     EYE SURGERY     FRACTURE SURGERY     HARDWARE REMOVAL Right 10/08/2023   Procedure: HARDWARE REMOVAL, right ankle;  Surgeon: Barton Drape, MD;  Location: [facility];  Service: Orthopedics;  Laterality: Right;   HARDWARE REMOVAL Right 06/23/2024   Procedure: REMOVAL, HARDWARE;  Surgeon: Barton Drape, MD;  Location: [facility];  Service: Orthopedics;  Laterality: Right;   LAPAROSCOPIC APPENDECTOMY N/A 01/08/2019   Procedure: APPENDECTOMY LAPAROSCOPIC;  Surgeon: Kallie Manuelita BROCKS, MD;  Location: [facility];  Service: General;  Laterality: N/A;   ORIF ANKLE FRACTURE Right 04/26/2022   Procedure: OPEN REDUCTION INTERNAL FIXATION (ORIF) ANKLE FRACTURE;  Surgeon: Barton Drape, MD;  Location: [facility];   Service: Orthopedics;  Laterality: Right;    Family History: Family History  Problem Relation Age of Onset   Polymyalgia rheumatica Mother    Migraines Mother    Coronary artery disease Father    Breast cancer Neg Hx     Social History: Social History   Socioeconomic History   Marital status: Married    Spouse name: Not on file   Number of children: Not on file   Years of education: Not on file   Highest education level: Not on file  Occupational History   Not on file  Tobacco Use   Smoking status: Every Day    Current packs/day: 1.00    Average packs/day: 1 pack/day for 52.0 years (52.0 ttl pk-yrs)    Types: Cigarettes   Smokeless tobacco: Never  Vaping Use   Vaping status: Never Used  Substance and Sexual Activity   Alcohol  use: No   Drug use: No   Sexual activity: Yes    Birth control/protection: Post-menopausal  Other Topics Concern   Not on file  Social History Narrative   Not on file   Social Drivers of Health   Financial Resource Strain: Not on file  Food Insecurity: Not on file  Transportation Needs: Not on file  Physical Activity: Not on file  Stress: Not on file  Social Connections: Not on file  Intimate Partner Violence: Not on file    Allergies: Clindamycin , Augmentin  [amoxicillin -pot clavulanate], Ketamine,  Naltrexone, Nsaids, Toradol  [ketorolac  tromethamine ], Tramadol , Atorvastatin, Benadryl [diphenhydramine hcl], Ceftin [cefuroxime axetil], Compazine, and Epinephrine   Medications: I have reviewed the patient's current medications.  Vital Signs: Patient Vitals for the past 24 hrs:  BP Temp Temp src Pulse Resp SpO2  06/23/24 1430 110/68 98.3 F (36.8 C) -- -- 16 95 %  06/23/24 1330 118/72 97.8 F (36.6 C) -- -- 16 94 %  06/23/24 1230 108/76 -- -- 80 -- 94 %  06/23/24 1215 124/69 97.7 F (36.5 C) (P) Oral 86 -- 91 %  06/23/24 1200 119/71 -- -- 72 20 92 %  06/23/24 1145 103/66 -- -- 68 12 (!) 88 %  06/23/24 1130 117/67 -- -- 70 11 90 %   06/23/24 1115 111/64 -- -- 67 13 93 %  06/23/24 1100 123/64 -- -- 67 10 97 %  06/23/24 1045 126/80 -- -- 68 16 93 %  06/23/24 1030 116/76 -- -- 71 13 98 %  06/23/24 1015 139/69 -- -- 69 15 99 %    Radiology: DG Ankle Complete Right Result Date: 06/22/2024 CLINICAL DATA:  Fall, pain, bruising EXAM: RIGHT ANKLE - COMPLETE 3+ VIEW COMPARISON:  05/18/2022 FINDINGS: Plate and screw fixation device remains in the distal fibula. Previously seen distal tibial plate and screw fixation device has been removed. No acute fracture, subluxation or dislocation. Joint spaces maintained. Soft tissues are intact. IMPRESSION: No acute bony abnormality. Electronically Signed   By: Franky Crease M.D.   On: 06/22/2024 23:10    Labs: No results for input(s): WBC, RBC, HCT, PLT in the last 72 hours. No results for input(s): NA, K, CL, CO2, BUN, CREATININE, GLUCOSE, CALCIUM in the last 72 hours. No results for input(s): LABPT, INR in the last 72 hours.  Review of Systems: ROS as detailed in HPI  Physical Exam: Body mass index is 18.81 kg/m.  Physical Exam   Gen: AAOx3, NAD Comfortable at rest  Right Lower Extremity: Skin intact, moderate ecchymosis over posterior distal leg TTP over bruising but skin intact Intact Thompson's Well controlled swelling Well healed ankle scars ADF/APF/EHL 5/5 SILT throughout DP, PT 2+ to palp CR < 2s   Assessment and Plan: Ortho consult for right leg contusion, DOI 06/23/24  -history, exam and imaging reviewed at length with patient/family -no surgical intervention for right leg contusion -pt was previously scheduled for right ankle removal of fibula hardware 06/24/24. She is eager to proceed with this so we will board her in the ER for surgery in the AM -NPO and hold VTE ppx from midnight  Lillia Mountain, MD Orthopaedic Surgeon [facility] [phone]

## 2024-07-19 ENCOUNTER — Ambulatory Visit (HOSPITAL_COMMUNITY)

## 2024-07-21 ENCOUNTER — Telehealth: Payer: Self-pay

## 2024-07-21 NOTE — Telephone Encounter (Signed)
 LVM and mailed letter to call and schedule annual Lung CT.

## 2024-08-30 ENCOUNTER — Ambulatory Visit (HOSPITAL_COMMUNITY)

## 2024-10-27 ENCOUNTER — Other Ambulatory Visit: Payer: Self-pay | Admitting: Acute Care

## 2024-10-27 DIAGNOSIS — Z122 Encounter for screening for malignant neoplasm of respiratory organs: Secondary | ICD-10-CM

## 2024-10-27 DIAGNOSIS — Z87891 Personal history of nicotine dependence: Secondary | ICD-10-CM

## 2024-10-27 DIAGNOSIS — F17210 Nicotine dependence, cigarettes, uncomplicated: Secondary | ICD-10-CM

## 2024-11-12 ENCOUNTER — Emergency Department (HOSPITAL_COMMUNITY)

## 2024-11-12 ENCOUNTER — Inpatient Hospital Stay (HOSPITAL_COMMUNITY)

## 2024-11-12 ENCOUNTER — Inpatient Hospital Stay (HOSPITAL_COMMUNITY)
Admission: EM | Admit: 2024-11-12 | Discharge: 2024-11-14 | DRG: 682 | Disposition: A | Discharge status: Home / Self Care | Attending: Family Medicine | Admitting: Family Medicine

## 2024-11-12 DIAGNOSIS — Z881 Allergy status to other antibiotic agents status: Secondary | ICD-10-CM

## 2024-11-12 DIAGNOSIS — I959 Hypotension, unspecified: Secondary | ICD-10-CM | POA: Diagnosis present

## 2024-11-12 DIAGNOSIS — R579 Shock, unspecified: Secondary | ICD-10-CM | POA: Diagnosis not present

## 2024-11-12 DIAGNOSIS — R6510 Systemic inflammatory response syndrome (SIRS) of non-infectious origin without acute organ dysfunction: Secondary | ICD-10-CM | POA: Diagnosis present

## 2024-11-12 DIAGNOSIS — R6521 Severe sepsis with septic shock: Secondary | ICD-10-CM | POA: Diagnosis not present

## 2024-11-12 DIAGNOSIS — J9621 Acute and chronic respiratory failure with hypoxia: Secondary | ICD-10-CM | POA: Diagnosis not present

## 2024-11-12 DIAGNOSIS — J449 Chronic obstructive pulmonary disease, unspecified: Secondary | ICD-10-CM | POA: Diagnosis present

## 2024-11-12 DIAGNOSIS — N179 Acute kidney failure, unspecified: Secondary | ICD-10-CM | POA: Diagnosis present

## 2024-11-12 DIAGNOSIS — Z1152 Encounter for screening for COVID-19: Secondary | ICD-10-CM

## 2024-11-12 DIAGNOSIS — F4310 Post-traumatic stress disorder, unspecified: Secondary | ICD-10-CM | POA: Diagnosis present

## 2024-11-12 DIAGNOSIS — F17210 Nicotine dependence, cigarettes, uncomplicated: Secondary | ICD-10-CM | POA: Diagnosis present

## 2024-11-12 DIAGNOSIS — F1120 Opioid dependence, uncomplicated: Secondary | ICD-10-CM | POA: Diagnosis present

## 2024-11-12 DIAGNOSIS — H548 Legal blindness, as defined in USA: Secondary | ICD-10-CM | POA: Diagnosis present

## 2024-11-12 DIAGNOSIS — R55 Syncope and collapse: Secondary | ICD-10-CM

## 2024-11-12 DIAGNOSIS — G928 Other toxic encephalopathy: Secondary | ICD-10-CM | POA: Diagnosis present

## 2024-11-12 DIAGNOSIS — K589 Irritable bowel syndrome without diarrhea: Secondary | ICD-10-CM | POA: Diagnosis present

## 2024-11-12 DIAGNOSIS — T50905A Adverse effect of unspecified drugs, medicaments and biological substances, initial encounter: Secondary | ICD-10-CM | POA: Diagnosis present

## 2024-11-12 DIAGNOSIS — R9431 Abnormal electrocardiogram [ECG] [EKG]: Secondary | ICD-10-CM

## 2024-11-12 DIAGNOSIS — R578 Other shock: Secondary | ICD-10-CM | POA: Diagnosis present

## 2024-11-12 DIAGNOSIS — K219 Gastro-esophageal reflux disease without esophagitis: Secondary | ICD-10-CM | POA: Diagnosis present

## 2024-11-12 DIAGNOSIS — R519 Headache, unspecified: Secondary | ICD-10-CM

## 2024-11-12 DIAGNOSIS — A419 Sepsis, unspecified organism: Secondary | ICD-10-CM

## 2024-11-12 DIAGNOSIS — F32A Depression, unspecified: Secondary | ICD-10-CM | POA: Diagnosis present

## 2024-11-12 DIAGNOSIS — D751 Secondary polycythemia: Secondary | ICD-10-CM | POA: Diagnosis present

## 2024-11-12 DIAGNOSIS — J9600 Acute respiratory failure, unspecified whether with hypoxia or hypercapnia: Principal | ICD-10-CM

## 2024-11-12 DIAGNOSIS — E872 Acidosis, unspecified: Secondary | ICD-10-CM | POA: Diagnosis present

## 2024-11-12 DIAGNOSIS — F419 Anxiety disorder, unspecified: Secondary | ICD-10-CM | POA: Diagnosis present

## 2024-11-12 DIAGNOSIS — R7401 Elevation of levels of liver transaminase levels: Secondary | ICD-10-CM | POA: Diagnosis present

## 2024-11-12 DIAGNOSIS — I5030 Unspecified diastolic (congestive) heart failure: Secondary | ICD-10-CM

## 2024-11-12 DIAGNOSIS — E860 Dehydration: Secondary | ICD-10-CM | POA: Diagnosis present

## 2024-11-12 DIAGNOSIS — G894 Chronic pain syndrome: Secondary | ICD-10-CM | POA: Diagnosis present

## 2024-11-12 DIAGNOSIS — Z79899 Other long term (current) drug therapy: Secondary | ICD-10-CM

## 2024-11-12 DIAGNOSIS — Z8249 Family history of ischemic heart disease and other diseases of the circulatory system: Secondary | ICD-10-CM

## 2024-11-12 DIAGNOSIS — Z9981 Dependence on supplemental oxygen: Secondary | ICD-10-CM

## 2024-11-12 DIAGNOSIS — R4182 Altered mental status, unspecified: Secondary | ICD-10-CM | POA: Diagnosis not present

## 2024-11-12 DIAGNOSIS — H5712 Ocular pain, left eye: Secondary | ICD-10-CM

## 2024-11-12 DIAGNOSIS — R4020 Unspecified coma: Principal | ICD-10-CM

## 2024-11-12 DIAGNOSIS — H109 Unspecified conjunctivitis: Secondary | ICD-10-CM | POA: Diagnosis present

## 2024-11-12 DIAGNOSIS — R54 Age-related physical debility: Secondary | ICD-10-CM | POA: Diagnosis present

## 2024-11-12 LAB — COMPREHENSIVE METABOLIC PANEL WITH GFR
ALT: 68 U/L — ABNORMAL HIGH (ref 0–44)
AST: 101 U/L — ABNORMAL HIGH (ref 15–41)
Albumin: 3.6 g/dL (ref 3.5–5.0)
Alkaline Phosphatase: 133 U/L — ABNORMAL HIGH (ref 38–126)
Anion gap: 16 — ABNORMAL HIGH (ref 5–15)
BUN: 18 mg/dL (ref 8–23)
CO2: 26 mmol/L (ref 22–32)
Calcium: 8.8 mg/dL — ABNORMAL LOW (ref 8.9–10.3)
Chloride: 100 mmol/L (ref 98–111)
Creatinine, Ser: 1.22 mg/dL — ABNORMAL HIGH (ref 0.44–1.00)
GFR, Estimated: 49 mL/min — ABNORMAL LOW (ref >60)
Glucose, Bld: 110 mg/dL — ABNORMAL HIGH (ref 70–99)
Potassium: 4.1 mmol/L (ref 3.5–5.1)
Sodium: 142 mmol/L (ref 135–145)
Total Bilirubin: 0.6 mg/dL (ref 0.0–1.2)
Total Protein: 7.2 g/dL (ref 6.5–8.1)

## 2024-11-12 LAB — CBC WITH DIFFERENTIAL/PLATELET
Abs Immature Granulocytes: 0.08 K/uL — ABNORMAL HIGH (ref 0.00–0.07)
Basophils Absolute: 0 K/uL (ref 0.0–0.1)
Basophils Relative: 0 %
Eosinophils Absolute: 0 K/uL (ref 0.0–0.5)
Eosinophils Relative: 0 %
HCT: 55.9 % — ABNORMAL HIGH (ref 36.0–46.0)
Hemoglobin: 17.5 g/dL — ABNORMAL HIGH (ref 12.0–15.0)
Immature Granulocytes: 1 %
Lymphocytes Relative: 8 %
Lymphs Abs: 1.1 K/uL (ref 0.7–4.0)
MCH: 32 pg (ref 26.0–34.0)
MCHC: 31.3 g/dL (ref 30.0–36.0)
MCV: 102.2 fL — ABNORMAL HIGH (ref 80.0–100.0)
Monocytes Absolute: 0.9 K/uL (ref 0.1–1.0)
Monocytes Relative: 6 %
Neutro Abs: 12.9 K/uL — ABNORMAL HIGH (ref 1.7–7.7)
Neutrophils Relative %: 85 %
Platelets: 284 K/uL (ref 150–400)
RBC: 5.47 MIL/uL — ABNORMAL HIGH (ref 3.87–5.11)
RDW: 14.2 % (ref 11.5–15.5)
WBC: 15.1 K/uL — ABNORMAL HIGH (ref 4.0–10.5)
nRBC: 0 % (ref 0.0–0.2)

## 2024-11-12 LAB — I-STAT CG4 LACTIC ACID, ED
Lactic Acid, Venous: 3.3 mmol/L (ref 0.5–1.9)
Lactic Acid, Venous: 1.9 mmol/L (ref 0.5–1.9)

## 2024-11-12 LAB — TROPONIN I (HIGH SENSITIVITY)
Troponin I (High Sensitivity): 32 ng/L — ABNORMAL HIGH (ref <18)
Troponin I (High Sensitivity): 25 ng/L — ABNORMAL HIGH (ref <18)

## 2024-11-12 LAB — I-STAT CHEM 8, ED
BUN: 21 mg/dL (ref 8–23)
Calcium, Ion: 1 mmol/L — ABNORMAL LOW (ref 1.15–1.40)
Chloride: 99 mmol/L (ref 98–111)
Creatinine, Ser: 1.2 mg/dL — ABNORMAL HIGH (ref 0.44–1.00)
Glucose, Bld: 108 mg/dL — ABNORMAL HIGH (ref 70–99)
HCT: 56 % — ABNORMAL HIGH (ref 36.0–46.0)
Hemoglobin: 19 g/dL — ABNORMAL HIGH (ref 12.0–15.0)
Potassium: 3.8 mmol/L (ref 3.5–5.1)
Sodium: 141 mmol/L (ref 135–145)
TCO2: 28 mmol/L (ref 22–32)

## 2024-11-12 LAB — URINALYSIS, ROUTINE W REFLEX MICROSCOPIC
Bilirubin Urine: NEGATIVE
Glucose, UA: NEGATIVE mg/dL
Hgb urine dipstick: NEGATIVE
Ketones, ur: 5 mg/dL — AB
Leukocytes,Ua: NEGATIVE
Nitrite: NEGATIVE
Protein, ur: NEGATIVE mg/dL
Specific Gravity, Urine: 1.025 (ref 1.005–1.030)
pH: 5 (ref 5.0–8.0)

## 2024-11-12 LAB — RESP PANEL BY RT-PCR (RSV, FLU A&B, COVID)  RVPGX2
Influenza A by PCR: NEGATIVE
Influenza B by PCR: NEGATIVE
Resp Syncytial Virus by PCR: NEGATIVE
SARS Coronavirus 2 by RT PCR: NEGATIVE

## 2024-11-12 LAB — I-STAT VENOUS BLOOD GAS, ED
Acid-Base Excess: 4 mmol/L — ABNORMAL HIGH (ref 0.0–2.0)
Bicarbonate: 31.9 mmol/L — ABNORMAL HIGH (ref 20.0–28.0)
Calcium, Ion: 0.98 mmol/L — ABNORMAL LOW (ref 1.15–1.40)
HCT: 56 % — ABNORMAL HIGH (ref 36.0–46.0)
Hemoglobin: 19 g/dL — ABNORMAL HIGH (ref 12.0–15.0)
O2 Saturation: 30 %
Potassium: 3.8 mmol/L (ref 3.5–5.1)
Sodium: 139 mmol/L (ref 135–145)
TCO2: 34 mmol/L — ABNORMAL HIGH (ref 22–32)
pCO2, Ven: 57.1 mmHg (ref 44–60)
pH, Ven: 7.356 (ref 7.25–7.43)
pO2, Ven: 21 mmHg — CL (ref 32–45)

## 2024-11-12 LAB — CBG MONITORING, ED
Glucose-Capillary: 112 mg/dL — ABNORMAL HIGH (ref 70–99)
Glucose-Capillary: 105 mg/dL — ABNORMAL HIGH (ref 70–99)

## 2024-11-12 LAB — CK: Total CK: 141 U/L (ref 38–234)

## 2024-11-12 LAB — RAPID URINE DRUG SCREEN, HOSP PERFORMED
Amphetamines: NOT DETECTED
Barbiturates: NOT DETECTED
Benzodiazepines: POSITIVE — AB
Cocaine: NOT DETECTED
Opiates: POSITIVE — AB
Tetrahydrocannabinol: NOT DETECTED

## 2024-11-12 LAB — MAGNESIUM: Magnesium: 2.3 mg/dL (ref 1.7–2.4)

## 2024-11-12 LAB — ETHANOL: Alcohol, Ethyl (B): <15 mg/dL (ref <15)

## 2024-11-12 MED ORDER — VANCOMYCIN HCL IN DEXTROSE 1-5 GM/200ML-% IV SOLN
1000.0000 mg | Freq: Once | INTRAVENOUS | Status: AC
  Administered 2024-11-12: 1000 mg via INTRAVENOUS

## 2024-11-12 MED ORDER — PIPERACILLIN-TAZOBACTAM 3.375 G IVPB
3.3750 g | Freq: Three times a day (TID) | INTRAVENOUS | Status: DC
  Administered 2024-11-13 – 2024-11-14 (×5): 3.375 g via INTRAVENOUS
  Filled 2024-11-12 (×5): qty 50

## 2024-11-12 MED ORDER — CARBOXYMETHYLCELLULOSE SODIUM 0.5 % OP SOLN: 2.0000 [drp] | OPHTHALMIC | Status: DC | PRN

## 2024-11-12 MED ORDER — PIPERACILLIN-TAZOBACTAM 3.375 G IVPB 30 MIN: 3.3750 g | Freq: Once | INTRAVENOUS | Status: DC

## 2024-11-12 MED ORDER — ENOXAPARIN SODIUM 30 MG/0.3ML IJ SOSY
30.0000 mg | PREFILLED_SYRINGE | INTRAMUSCULAR | Status: DC
  Administered 2024-11-12 – 2024-11-13 (×2): 30 mg via SUBCUTANEOUS
  Filled 2024-11-12 (×2): qty 0.3

## 2024-11-12 MED ORDER — OXYCODONE-ACETAMINOPHEN 10-325 MG PO TABS: 1.0000 | ORAL_TABLET | Freq: Four times a day (QID) | ORAL | Status: DC | PRN

## 2024-11-12 MED ORDER — METRONIDAZOLE 500 MG/100ML IV SOLN: 500.0000 mg | Freq: Once | INTRAVENOUS | Status: DC

## 2024-11-12 MED ORDER — LACTATED RINGERS IV BOLUS
1000.0000 mL | Freq: Once | INTRAVENOUS | Status: AC
  Administered 2024-11-12: 1000 mL via INTRAVENOUS

## 2024-11-12 MED ORDER — ALBUTEROL SULFATE (2.5 MG/3ML) 0.083% IN NEBU: 2.5000 mg | INHALATION_SOLUTION | RESPIRATORY_TRACT | Status: DC | PRN

## 2024-11-12 MED ORDER — SODIUM CHLORIDE 0.9 % IV SOLN
2.0000 g | Freq: Once | INTRAVENOUS | Status: AC
  Administered 2024-11-12: 2 g via INTRAVENOUS
  Filled 2024-11-12: qty 40

## 2024-11-12 MED ORDER — OXYCODONE HCL 5 MG PO TABS
5.0000 mg | ORAL_TABLET | Freq: Four times a day (QID) | ORAL | Status: DC | PRN
  Administered 2024-11-12 – 2024-11-14 (×7): 5 mg via ORAL
  Filled 2024-11-12 (×7): qty 1

## 2024-11-12 MED ORDER — LOPERAMIDE HCL 2 MG PO CAPS
4.0000 mg | ORAL_CAPSULE | Freq: Three times a day (TID) | ORAL | Status: DC | PRN
  Administered 2024-11-13 – 2024-11-14 (×3): 4 mg via ORAL
  Filled 2024-11-12 (×3): qty 2

## 2024-11-12 MED ORDER — ONDANSETRON HCL 4 MG PO TABS: 4.0000 mg | ORAL_TABLET | Freq: Four times a day (QID) | ORAL | Status: DC | PRN

## 2024-11-12 MED ORDER — SODIUM CHLORIDE 0.9 % IV SOLN: 2.0000 g | Freq: Once | INTRAVENOUS | Status: DC

## 2024-11-12 MED ORDER — OXYCODONE-ACETAMINOPHEN 5-325 MG PO TABS
1.0000 | ORAL_TABLET | Freq: Four times a day (QID) | ORAL | Status: DC | PRN
  Administered 2024-11-12 – 2024-11-14 (×7): 1 via ORAL
  Filled 2024-11-12 (×7): qty 1

## 2024-11-12 MED ORDER — LOPERAMIDE HCL 2 MG PO CAPS
4.0000 mg | ORAL_CAPSULE | Freq: Every day | ORAL | Status: DC
  Administered 2024-11-12 – 2024-11-14 (×3): 4 mg via ORAL
  Filled 2024-11-12 (×3): qty 2

## 2024-11-12 MED ORDER — LOPERAMIDE HCL 2 MG PO CAPS: 4.0000 mg | ORAL_CAPSULE | ORAL | Status: DC | PRN

## 2024-11-12 MED ORDER — ONDANSETRON HCL 4 MG/2ML IJ SOLN
4.0000 mg | Freq: Four times a day (QID) | INTRAMUSCULAR | Status: DC | PRN
  Administered 2024-11-13 (×2): 4 mg via INTRAVENOUS
  Filled 2024-11-12 (×2): qty 2

## 2024-11-12 MED ORDER — POLYVINYL ALCOHOL 1.4 % OP SOLN: 1.0000 [drp] | OPHTHALMIC | Status: DC | PRN

## 2024-11-12 MED ORDER — METRONIDAZOLE 500 MG/100ML IV SOLN: 500.0000 mg | Freq: Two times a day (BID) | INTRAVENOUS | Status: DC

## 2024-11-12 MED ORDER — VANCOMYCIN VARIABLE DOSE PER UNSTABLE RENAL FUNCTION (PHARMACIST DOSING): Status: DC

## 2024-11-12 MED ORDER — FLUORESCEIN SODIUM 1 MG OP STRP
1.0000 | ORAL_STRIP | Freq: Once | OPHTHALMIC | Status: AC
  Administered 2024-11-12: 1 via OPHTHALMIC
  Filled 2024-11-12: qty 1

## 2024-11-12 MED ORDER — ACETAMINOPHEN 650 MG RE SUPP: 650.0000 mg | Freq: Four times a day (QID) | RECTAL | Status: DC | PRN

## 2024-11-12 MED ORDER — SODIUM CHLORIDE 0.9 % IV SOLN: INTRAVENOUS | Status: AC

## 2024-11-12 MED ORDER — BUDESON-GLYCOPYRROL-FORMOTEROL 160-9-4.8 MCG/ACT IN AERO
2.0000 | INHALATION_SPRAY | Freq: Two times a day (BID) | RESPIRATORY_TRACT | Status: DC
  Administered 2024-11-12 – 2024-11-14 (×4): 2 via RESPIRATORY_TRACT
  Filled 2024-11-12: qty 5.9

## 2024-11-12 MED ORDER — ACETAMINOPHEN 325 MG PO TABS: 650.0000 mg | ORAL_TABLET | Freq: Four times a day (QID) | ORAL | Status: DC | PRN

## 2024-11-12 MED ORDER — NICOTINE 21 MG/24HR TD PT24
21.0000 mg | MEDICATED_PATCH | Freq: Every day | TRANSDERMAL | Status: DC
  Administered 2024-11-12 – 2024-11-14 (×3): 21 mg via TRANSDERMAL
  Filled 2024-11-12 (×3): qty 1

## 2024-11-12 MED ORDER — TETRACAINE HCL 0.5 % OP SOLN
1.0000 [drp] | Freq: Once | OPHTHALMIC | Status: AC
  Administered 2024-11-12: 1 [drp] via OPHTHALMIC
  Filled 2024-11-12: qty 4

## 2024-11-12 NOTE — ED Notes (Signed)
 Pt. Was sat. In 80s; bu,ped her O2 up to 4L and she went back to sat. At 100%.

## 2024-11-12 NOTE — Consult Note (Addendum)
 NAME:  [name], MRN:  [id number], DOB:  1956-08-09, LOS: 0 ADMISSION DATE:  11/12/2024, CONSULTATION DATE:  11/28 REFERRING MD:  Dr. 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  EMS reported no seizure activity, tongue biting, or incontinence noted.  Initially presented as code stemi given abnormal J point in inferior and lateral leads which has been since canceled by cardiology.    Initially afebrile, BP 86/56, HR 70s NSR, non labored and requiring 3L [place] with sats 98%.  CXR non acute, labs noted for sCr 1.22, Alk phos 133, AST/ ALT 101/68, WBC 15.1, H/H 17.5/ 55.9, lactic 3.3, trop 32 and VBG 7.356/ 57.  CTH negative.  Had episode in ER where pt was unresponsive and desaturated into 70's, no clinical seizure activity noted.  Mental status improved without intervention but remains drowsy.  Neurology consulted and spot EEG being completed.  2L LR being given with improvement in BP.  Cultures sent and empirically started on broad spectrum abx.  UA pending.  PCCM called for evaluation of ICU given concern of prior mental status and BP.   Pt reports she feels like she is having trouble articulating herself but complains of left eye pain for several days with tearing, blurred vision, and sensitivity to light and headache.  Has not been eating or drinking well for several days and feels dehydrated but is hungry now.  Intermittent fevers she related to her teeth and states she needs dental work.  Some nausea, no vomiting, chronic non-bloody diarrhea, denies CP or SOB, has baseline cough with yellowish sputum.  Taking her percocet as prescribed, denies misuse.  Denies urinary symptoms.  Had flu shot/  covid vaccine last week.   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 rate (!) 8, SpO2 90%.       No intake or output data in the 24 hours ending 11/12/24 1450 There were no vitals filed for this visit.  Examination: General:  older female lying in bed in NAD HEENT: MM pink/minimally moist, slightly beefy tongue- squints to light in left eye- tearing, injected conjunctiva Neuro: initially drowsy but then able to wake up, oriented to person, place, no correct on month/ year- said her birthday, speech clear, otherwise non focal, no obvious nuchal rigidity.  CV: rr, no murmur, +1 pulses PULM:  non labored, clear, 98 on 3L GI: soft, bs+, NT Extremities: cool extremities/dry, trace right ankle edema, healed previous right ankle scarring Skin: no rashes    Resolved problem list   Assessment and Plan   Hypotension, resolved  Lactic acidosis Abnormal EKG Leukocytosis  Encephalopathy, resolved AKI COPD, no AE Chronic hypoxic respiratory failure on 3L HOT nightly Tobacco abuse Left eye conjunctivitis Elevated LFTs  P:  - at this time, mental status is much improved, alert and protecting airway -  BP stable after fluid resuscitation - recheck lactic improved to 1.9 - pending UA, follow cultures, narrow abx per primary team as warranted - at this time, pt has no ICU requirements and can be admitted to [facility] - Neurology consulting, pending EEG - Cardiology consulted> no need for IV heparin or intervention as no evidence of wall motion abnormalities on bedside echo and IVC narrow - cont supplemental O2  for sat goal > 90% - cont duonebs prn, home breztri  - tobacco cessation counseling    Remainder per primary team.   PCCM will sign off.  Please call us  back if we can be of any further assistance.   Labs   CBC: Recent Labs  Lab 11/12/24 1250 11/12/24 1255  WBC 15.1*  --   NEUTROABS 12.9*  --   HGB 17.5* 19.0*  19.0*  HCT 55.9* 56.0*  56.0*  MCV 102.2*  --   PLT 284  --     Basic Metabolic Panel: Recent Labs  Lab 11/12/24 1250 11/12/24 1255  NA 142 139  141  K 4.1 3.8  3.8  CL 100 99  CO2 26  --   GLUCOSE 110* 108*  BUN 18 21  CREATININE 1.22* 1.20*  CALCIUM 8.8*  --   MG 2.3  --    GFR: CrCl cannot be calculated (Unknown ideal weight.). Recent Labs  Lab 11/12/24 1250 11/12/24 1255  WBC 15.1*  --   LATICACIDVEN  --  3.3*    Liver Function Tests: Recent Labs  Lab 11/12/24 1250  AST 101*  ALT 68*  ALKPHOS 133*  BILITOT 0.6  PROT 7.2  ALBUMIN 3.6   No results for input(s): LIPASE, AMYLASE in the last 168 hours. No results for input(s): AMMONIA in the last 168 hours.  ABG    Component Value Date/Time   PHART 7.48 (H) 04/25/2022 1708   PCO2ART 43 04/25/2022 1708   PO2ART 63 (L) 04/25/2022 1708   HCO3 31.9 (H) 11/12/2024 1255   TCO2 28 11/12/2024 1255   TCO2 34 (H) 11/12/2024 1255   O2SAT 30 11/12/2024 1255     Coagulation Profile: No results for input(s): INR, PROTIME in the last 168 hours.  Cardiac Enzymes: No results for input(s): CKTOTAL, CKMB, CKMBINDEX, TROPONINI in the last 168 hours.  HbA1C: No results found for: HGBA1C  CBG: Recent Labs  Lab 11/12/24 1240 11/12/24 1342  GLUCAP 112* 105*    Review of Systems:   Review of Systems  Constitutional:  Positive for fever. Negative for chills.  HENT:  Negative for sore throat.   Eyes:  Positive for blurred vision, photophobia, pain, discharge and redness.  Respiratory:  Positive for cough and sputum production. Negative for hemoptysis, shortness of breath  and wheezing.   Cardiovascular:  Negative for leg swelling.  Gastrointestinal:  Positive for diarrhea and nausea. Negative for abdominal pain, blood in stool and vomiting.  Genitourinary:  Negative for dysuria, flank pain, frequency and urgency.  Neurological:  Positive for headaches. Negative for sensory change, speech change and focal weakness.   Past Medical History:  She,  has a past medical history of Cancer (HCC), Chronic pain, COPD (chronic obstructive pulmonary disease) (HCC), Depression, Diverticulosis, GERD (gastroesophageal reflux disease), IBS (irritable bowel syndrome), Legally blind in right eye, as defined in [place] , Myelomalacia (HCC), Oxygen  dependent, Panic disorder, Panic disorder, Pneumonia, PTSD (post-traumatic stress disorder), PTSD (post-traumatic stress disorder) (01/08/2019), and Temporomandibular joint disorder (TMJ).   Surgical History:   Past Surgical History:  Procedure Laterality  Date   APPENDECTOMY     CHOLECYSTECTOMY     EYE SURGERY     FRACTURE SURGERY     HARDWARE REMOVAL Right 10/08/2023   Procedure: HARDWARE REMOVAL, right ankle;  Surgeon: Barton Drape, MD;  Location: [facility];  Service: Orthopedics;  Laterality: Right;   HARDWARE REMOVAL Right 06/23/2024   Procedure: REMOVAL, HARDWARE;  Surgeon: Barton Drape, MD;  Location: [facility];  Service: Orthopedics;  Laterality: Right;   LAPAROSCOPIC APPENDECTOMY N/A 01/08/2019   Procedure: APPENDECTOMY LAPAROSCOPIC;  Surgeon: Kallie Manuelita BROCKS, MD;  Location: [facility];  Service: General;  Laterality: N/A;   ORIF ANKLE FRACTURE Right 04/26/2022   Procedure: OPEN REDUCTION INTERNAL FIXATION (ORIF) ANKLE FRACTURE;  Surgeon: Barton Drape, MD;  Location: [facility];  Service: Orthopedics;  Laterality: Right;     Social History:   reports that she has been smoking cigarettes. She has a 52 pack-year smoking history. She has never used smokeless tobacco. She reports that she does not drink  alcohol  and does not use drugs.   Family History:  Her family history includes Coronary artery disease in her father; Migraines in her mother; Polymyalgia rheumatica in her mother. There is no history of Breast cancer.   Allergies Allergies  Allergen Reactions   Clindamycin  Nausea And Vomiting and Rash   Augmentin  [Amoxicillin -Pot Clavulanate] Other (See Comments)    GI upset   Ketamine Other (See Comments)    Made me act crazy   Naltrexone Other (See Comments)    Hallucinations, sleepiness, withdrawal s/s, poor apepitie    Nsaids     Advised not to take    Toradol  [Ketorolac  Tromethamine ] Nausea And Vomiting and Swelling    TONGUE SWELLING   Tramadol  Nausea And Vomiting   Atorvastatin Other (See Comments), Itching, Nausea Only and Tinitus   Benadryl [Diphenhydramine Hcl] Palpitations    Insomnia   Ceftin [Cefuroxime Axetil] Rash   Compazine Palpitations    Insomnia   Epinephrine  Palpitations    Insomina     Home Medications  Prior to Admission medications   Medication Sig Start Date End Date Taking? Authorizing Provider  atorvastatin (LIPITOR) 20 MG tablet Take 20 mg by mouth daily. 11/03/24  Yes [provider]  acetaminophen  (TYLENOL ) 500 MG tablet Take 1,000 mg by mouth every 8 (eight) hours as needed (pain).    [provider]  albuterol  (PROVENTIL ) (2.5 MG/3ML) 0.083% nebulizer solution Take 3 mLs (2.5 mg total) by nebulization every 6 (six) hours as needed for wheezing or shortness of breath. 11/12/22   Sood, Vineet, MD  albuterol  (VENTOLIN  HFA) 108 (90 Base) MCG/ACT inhaler Inhale 1-2 puffs into the lungs every 6 (six) hours as needed for wheezing or shortness of breath.    [provider]  Azelastine  HCl 137 MCG/SPRAY SOLN Place 1 spray into the nose daily as needed (for nasal congestion).  10/27/18   [provider]  BREZTRI  AEROSPHERE 160-9-4.8 MCG/ACT AERO inhale TWO PUFFS TWICE DAILY morning AND bedtime 06/05/23   Sood, Vineet,  MD  carboxymethylcellulose (REFRESH PLUS) 0.5 % SOLN Place 2-4 drops into both eyes as needed (dry eyes).    [provider]  Cholecalciferol (VITAMIN D3) 125 MCG (5000 UT) CAPS Take 1 capsule by mouth in the morning and at bedtime. Taking a day    [provider]  clonazePAM  (KLONOPIN ) 1 MG tablet Take 0.5 tablets (0.5 mg total) by mouth 2 (two) times daily as needed for anxiety. *May take one additional tablet  as needed for anxiety/PTSD Patient taking differently: Take 1 mg by mouth in the morning, at noon, and at bedtime. 07/08/19   Pearlean Manus, MD  cyclobenzaprine  (FLEXERIL ) 5 MG tablet Take 5 mg by mouth 2 (two) times daily.    [provider]  fluticasone  (FLONASE ) 50 MCG/ACT nasal spray Place 2 sprays into both nostrils 2 (two) times daily as needed for allergies.    [provider]  gabapentin  (NEURONTIN ) 300 MG capsule Take 600-900 mg by mouth See admin instructions. 900 mg in the morning, 600 mg midday, 900 mg at bedtime    [provider]  loperamide  (IMODIUM ) 2 MG capsule Take 4 mg by mouth daily. And then as needed    [provider]  oxyCODONE -acetaminophen  (PERCOCET) 10-325 MG tablet Take 1 tablet by mouth 4 (four) times daily.    [provider]  OXYGEN  Inhale 3 L into the lungs at bedtime.    [provider]  SANTYL 250 UNIT/GM ointment Apply 1 Application topically daily. APPLY TO CLEANSED AFFECTED AREA BY TOPICAL ROUTE ONCE DAILY 08/13/24   [provider]  White Petrolatum -Mineral Oil (SYSTANE NIGHTTIME) OINT Place 1 Application into both eyes at bedtime.    [provider]     Critical care time: n/a    Lyle Pesa, NP [facility] 11/12/2024, 3:41 PM  See Amion for pager If no response to pager , please call [phone] until 7pm After 7:00 pm call Elink  [phone]

## 2024-11-12 NOTE — Sepsis Progress Note (Signed)
 Sepsis protocol monitored by eLink

## 2024-11-12 NOTE — Progress Notes (Signed)
 STAT EEG complete - results pending. ? ?

## 2024-11-12 NOTE — Progress Notes (Signed)
 Pt arrived to 4E, VSS, Chg complete, oriented to unit, call light within reach, pt AAOx4  Laneta JAYSON Rao, RN 11/12/2024 6:58 PM

## 2024-11-12 NOTE — Consult Note (Signed)
 NEUROLOGY CONSULT NOTE   Date of service: November 12, 2024 Patient Name: [name] MRN:  [id number] DOB:  [birth year] Chief Complaint: Syncopal episode Requesting Provider: Yolande Lamar BROCKS, MD  History of Present Illness  [name] is a 68 y.o. female with presentation as a code STEMI however this was canceled.  She has had episodes of in and out of consciousness at 1 point was unresponsive ED physician concern for possible seizure.  Stat EEG ordered at the time my evaluation she was alert awake following commands.   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   COPD (chronic obstructive pulmonary disease) (HCC)    Depression    Diverticulosis    GERD (gastroesophageal reflux disease)    IBS (irritable bowel syndrome)    Legally blind in right eye, as defined in [place]     Myelomalacia (HCC)    Oxygen  dependent    uses O2 at 3L/[place] at night   Panic disorder    Panic disorder    Pneumonia    as a child   PTSD (post-traumatic stress disorder)    PTSD (post-traumatic stress disorder) 01/08/2019   Temporomandibular joint disorder (TMJ)    age 39 fractured TMJ - repaired by oral surgeon    Past Surgical History:  Procedure Laterality Date   APPENDECTOMY     CHOLECYSTECTOMY     EYE SURGERY     FRACTURE SURGERY     HARDWARE REMOVAL Right 10/08/2023   Procedure: HARDWARE REMOVAL, right ankle;  Surgeon: Barton Drape, MD;  Location: [facility];  Service: Orthopedics;  Laterality: Right;   HARDWARE REMOVAL Right 06/23/2024   Procedure: REMOVAL, HARDWARE;  Surgeon: Barton Drape, MD;  Location: [facility];  Service: Orthopedics;  Laterality: Right;   LAPAROSCOPIC APPENDECTOMY N/A 01/08/2019   Procedure: APPENDECTOMY LAPAROSCOPIC;  Surgeon: Kallie Manuelita BROCKS, MD;   Location: [facility];  Service: General;  Laterality: N/A;   ORIF ANKLE FRACTURE Right 04/26/2022   Procedure: OPEN REDUCTION INTERNAL FIXATION (ORIF) ANKLE FRACTURE;  Surgeon: Barton Drape, MD;  Location: [facility];  Service: Orthopedics;  Laterality: Right;    Family History: Family History  Problem Relation Age of Onset   Polymyalgia rheumatica Mother    Migraines Mother    Coronary artery disease Father    Breast cancer Neg Hx     Social History  reports that she has been smoking cigarettes. She has a 52 pack-year smoking history. She has never used smokeless tobacco. She reports that she does not drink alcohol  and does not use drugs.  Allergies  Allergen Reactions   Clindamycin  Nausea And Vomiting and Rash   Augmentin  [Amoxicillin -Pot Clavulanate] Other (See Comments)    GI upset   Ketamine     Pt preference    Naltrexone Other (See Comments)    Hallucinations, sleepiness, withdrawal s/s, poor apepitie    Nsaids     Advised not to take    Toradol  [Ketorolac  Tromethamine ] Nausea And Vomiting and Swelling    TONGUE SWELLING   Tramadol  Nausea And Vomiting   Atorvastatin Other (See Comments), Itching, Nausea Only and Tinitus   Benadryl [Diphenhydramine Hcl] Palpitations    Insomnia   Ceftin [Cefuroxime Axetil] Rash   Compazine Palpitations    Insomnia   Epinephrine   Palpitations    Insomina    Medications   Current Facility-Administered Medications:    meropenem (MERREM) 2 g in sodium chloride  0.9 % 100 mL IVPB, 2 g, Intravenous, Once, Yolande Lamar BROCKS, MD, Last Rate: 280 mL/hr at 11/12/24 1429, 2 g at 11/12/24 1429   vancomycin  (VANCOCIN ) IVPB 1000 mg/200 mL premix, 1,000 mg, Intravenous, Once, Yolande Lamar BROCKS, MD, Last Rate: 200 mL/hr at 11/12/24 1428, 1,000 mg at 11/12/24 1428  Current Outpatient Medications:    atorvastatin (LIPITOR) 20 MG tablet, Take 20 mg by mouth daily., Disp: , Rfl:    acetaminophen  (TYLENOL ) 500 MG tablet, Take 1,000 mg by mouth every 8  (eight) hours as needed (pain)., Disp: , Rfl:    albuterol  (PROVENTIL ) (2.5 MG/3ML) 0.083% nebulizer solution, Take 3 mLs (2.5 mg total) by nebulization every 6 (six) hours as needed for wheezing or shortness of breath., Disp: 360 mL, Rfl: 5   albuterol  (VENTOLIN  HFA) 108 (90 Base) MCG/ACT inhaler, Inhale 1-2 puffs into the lungs every 6 (six) hours as needed for wheezing or shortness of breath., Disp: , Rfl:    Azelastine  HCl 137 MCG/SPRAY SOLN, Place 1 spray into the nose daily as needed (for nasal congestion). , Disp: , Rfl:    BREZTRI  AEROSPHERE 160-9-4.8 MCG/ACT AERO, inhale TWO PUFFS TWICE DAILY morning AND bedtime, Disp: 10.7 g, Rfl: 5   carboxymethylcellulose (REFRESH PLUS) 0.5 % SOLN, Place 2-4 drops into both eyes as needed (dry eyes)., Disp: , Rfl:    Cholecalciferol (VITAMIN D3) 125 MCG (5000 UT) CAPS, Take 1 capsule by mouth in the morning and at bedtime. Taking a day, Disp: , Rfl:    clonazePAM  (KLONOPIN ) 1 MG tablet, Take 0.5 tablets (0.5 mg total) by mouth 2 (two) times daily as needed for anxiety. *May take one additional tablet as needed for anxiety/PTSD (Patient taking differently: Take 1 mg by mouth in the morning, at noon, and at bedtime.), Disp: 2 tablet, Rfl: 0   cyclobenzaprine  (FLEXERIL ) 5 MG tablet, Take 5 mg by mouth 2 (two) times daily., Disp: , Rfl:    fluticasone  (FLONASE ) 50 MCG/ACT nasal spray, Place 2 sprays into both nostrils 2 (two) times daily as needed for allergies., Disp: , Rfl:    gabapentin  (NEURONTIN ) 300 MG capsule, Take 600-900 mg by mouth See admin instructions. 900 mg in the morning, 600 mg midday, 900 mg at bedtime, Disp: , Rfl:    loperamide  (IMODIUM ) 2 MG capsule, Take 4 mg by mouth daily. And then as needed, Disp: , Rfl:    oxyCODONE -acetaminophen  (PERCOCET) 10-325 MG tablet, Take 1 tablet by mouth 4 (four) times daily., Disp: , Rfl:    OXYGEN , Inhale 3 L into the lungs at bedtime., Disp: , Rfl:    SANTYL 250 UNIT/GM ointment, Apply 1 Application  topically daily. APPLY TO CLEANSED AFFECTED AREA BY TOPICAL ROUTE ONCE DAILY, Disp: , Rfl:    White Petrolatum -Mineral Oil (SYSTANE NIGHTTIME) OINT, Place 1 Application into both eyes at bedtime., Disp: , Rfl:   Vitals   Vitals:   11/12/24 1252 11/12/24 1255 11/12/24 1300 11/12/24 1305  BP:  (!) 89/77 (!) 87/66 92/65  Pulse: 72 70 71 70  Resp: 14 13 (!) 23 (!) 8  Temp:      TempSrc:      SpO2: 92% 93% (!) 89% 90%    There is no height or weight on file to calculate BMI.   Physical Exam   Constitutional: Appears ill Psych: Affect appropriate to situation.  Eyes: No scleral injection.  HENT: No OP obstruction.  Head: Normocephalic.  Cardiovascular: Normal rate and regular rhythm.  Respiratory: Effort normal, non-labored breathing.  GI: Soft.  No distension. There is no tenderness.  Skin: WDI.   Neurologic Examination   Mental status: She is alert and awake.  Oriented to person place and year.  Little slow to respond but able to give answers correctly.  Follows all commands.  No aphasia.  Cranials 2 through 12: Pupils equal and reactive extraocular motion intact.  No gaze deviation.  Face symmetric.  Motor: Nonfocal and normal. Sensory: Intact Cerebellar: No ataxia   Labs/Imaging/Neurodiagnostic studies   CBC:  Recent Labs  Lab December 10, 2024 1250 12/10/24 1255  WBC 15.1*  --   NEUTROABS 12.9*  --   HGB 17.5* 19.0*  19.0*  HCT 55.9* 56.0*  56.0*  MCV 102.2*  --   PLT 284  --    Basic Metabolic Panel:  Lab Results  Component Value Date   NA 141 December 10, 2024   NA 139 12-10-2024   K 3.8 12/10/2024   K 3.8 12-10-2024   CO2 26 12-10-24   GLUCOSE 108 (H) 12/10/24   BUN 21 10-Dec-2024   CREATININE 1.20 (H) 12-10-2024   CALCIUM 8.8 (L) 2024-12-10   GFRNONAA 49 (L) 2024/12/10   GFRAA >60 07/08/2019   Lipid Panel:  Lab Results  Component Value Date   LDLCALC 58 07/06/2019   HgbA1c: No results found for: HGBA1C Urine Drug Screen: No results found for:  LABOPIA, COCAINSCRNUR, LABBENZ, AMPHETMU, THCU, LABBARB  Alcohol  Level     Component Value Date/Time   ETH <15 Dec 10, 2024 1250   INR  Lab Results  Component Value Date   INR 0.9 10/25/2008   APTT  Lab Results  Component Value Date   APTT 29 10/25/2008   AED levels: No results found for: PHENYTOIN, ZONISAMIDE, LAMOTRIGINE, LEVETIRACETA  CT Head without contrast(Personally reviewed): Negative for bleed.    ASSESSMENT   [name] is a 68 y.o. female here as a code STEMI that was eventually canceled.  Cardiology on board.  Episodes of passing out will get a stat EEG.  However I am not concerned about seizure based on my examination she was alert awake and cooperative.  RECOMMENDATIONS   Stat EEG-no seizure.   Continue cardiac monitoring  Neurology will sign off.    Signed, Zeke CHRISTELLA Raddle, MD [facility]

## 2024-11-12 NOTE — ED Notes (Signed)
 Completed In/Out Cath

## 2024-11-12 NOTE — ED Triage Notes (Addendum)
 From home.by [facility].  Called out for syncopal.  Difficult to arouse on scene.  More alert on arrival.  LKW last night. Pt found unresponsive in bed by husband.   Only complains of L. Eye paion.  2L of 02 at baseline  86/56  HR 70's CBG 117 ETC02 45.

## 2024-11-12 NOTE — H&P (Signed)
 History and Physical    Patient: [name] FMW:[id number] DOB: 01/10/1956 DOA: 11/12/2024 DOS: the patient was seen and examined on 11/12/2024 PCP: Breanna Aquas, MD  Patient coming from: Home  Chief Complaint:  Chief Complaint  Patient presents with   Loss of Consciousness   HPI: [name] is a 68 y.o. female with medical history significant of chronic pain, COPD, depression, diverticulosis, GERD, right eye blindness, PTSD, chronic respiratory failure on oxygen . History provided by patient's husband at bedside as patient does not recall events leading up to her admission.  Per husband, around 8 AM this morning, patient was not responding to any of his commands and communication.  He reports that he attempted to wake up for about 2 hours before calling EMS for evaluation.  During the time that she was unresponsive, husband reports that the patient was diaphoretic but without any other symptoms.  Per patient, the last thing she remembers was thinking that a SWAT team came to her house, mistaking EMS for SWAT.  Patient reports that she has had no sneezing, rhinorrhea, dyspnea, chest pain.  She does report, this is mostly chronic and stable and related to her COPD she also reports in addition to chronic diarrhea.  Lastly, she reports some symptoms of dysuria.  Patient reports not using more than prescribed oxycodone  or gabapentin .  On arrival to the ED, patient was presented as a code STEMI secondary to EKG changes concerning for inferior ischemia.  Cardiology was consulted and canceled code STEMI after review of patient's EKG.  Also during her ED stay, patient developed some loss of consciousness with concern for possible seizure.  Stat EEG was ordered per neurology recommendations and was negative for seizure activity and more concerning for encephalopathy.  Review of Systems: As mentioned in the history of present illness. All other systems reviewed and are negative.  Past Medical  History:  Diagnosis Date   Cancer (HCC)    skin-rght side of nose   Chronic pain    gneralized denies fibromyalgia   COPD (chronic obstructive pulmonary disease) (HCC)    Depression    Diverticulosis    GERD (gastroesophageal reflux disease)    IBS (irritable bowel syndrome)    Legally blind in right [place], as defined in [place]     Myelomalacia (HCC)    Oxygen  dependent    uses O2 at 3L/[place] at night   Panic disorder    Panic disorder    Pneumonia    as a child   PTSD (post-traumatic stress disorder)    PTSD (post-traumatic stress disorder) 01/08/2019   Temporomandibular joint disorder (TMJ)    age 73 fractured TMJ - repaired by oral surgeon   Past Surgical History:  Procedure Laterality Date   APPENDECTOMY     CHOLECYSTECTOMY     EYE SURGERY     FRACTURE SURGERY     HARDWARE REMOVAL Right 10/08/2023   Procedure: HARDWARE REMOVAL, right ankle;  Surgeon: Barton Drape, MD;  Location: [facility];  Service: Orthopedics;  Laterality: Right;   HARDWARE REMOVAL Right 06/23/2024   Procedure: REMOVAL, HARDWARE;  Surgeon: Barton Drape, MD;  Location: [facility];  Service: Orthopedics;  Laterality: Right;   LAPAROSCOPIC APPENDECTOMY N/A 01/08/2019   Procedure: APPENDECTOMY LAPAROSCOPIC;  Surgeon: Kallie Manuelita BROCKS, MD;  Location: [facility];  Service: General;  Laterality: N/A;   ORIF ANKLE FRACTURE Right 04/26/2022   Procedure: OPEN REDUCTION INTERNAL FIXATION (ORIF) ANKLE FRACTURE;  Surgeon: Barton Drape, MD;  Location: [facility];  Service: Orthopedics;  Laterality: Right;   Social History:  reports that she has been smoking cigarettes. She has a 52 pack-year smoking history. She has never used smokeless tobacco. She reports that she does not drink alcohol  and does not use drugs.  Allergies  Allergen Reactions   Clindamycin  Nausea And Vomiting and Rash   Augmentin  [Amoxicillin -Pot Clavulanate] Other (See Comments)    GI upset   Ketamine Other (See Comments)    Made  me act crazy   Naltrexone Other (See Comments)    Hallucinations, sleepiness, withdrawal s/s, poor apepitie    Nsaids     Advised not to take    Toradol  [Ketorolac  Tromethamine ] Nausea And Vomiting and Swelling    TONGUE SWELLING   Tramadol  Nausea And Vomiting   Atorvastatin Other (See Comments), Itching, Nausea Only and Tinitus   Benadryl [Diphenhydramine Hcl] Palpitations    Insomnia   Ceftin [Cefuroxime Axetil] Rash   Compazine Palpitations    Insomnia   Epinephrine  Palpitations    Insomina    Family History  Problem Relation Age of Onset   Polymyalgia rheumatica Mother    Migraines Mother    Coronary artery disease Father    Breast cancer Neg Hx     Prior to Admission medications   Medication Sig Start Date End Date Taking? Authorizing Provider  atorvastatin (LIPITOR) 20 MG tablet Take 20 mg by mouth daily. 11/03/24  Yes [provider]  acetaminophen  (TYLENOL ) 500 MG tablet Take 1,000 mg by mouth every 8 (eight) hours as needed (pain).    [provider]  albuterol  (PROVENTIL ) (2.5 MG/3ML) 0.083% nebulizer solution Take 3 mLs (2.5 mg total) by nebulization every 6 (six) hours as needed for wheezing or shortness of breath. 11/12/22   Sood, Vineet, MD  albuterol  (VENTOLIN  HFA) 108 (90 Base) MCG/ACT inhaler Inhale 1-2 puffs into the lungs every 6 (six) hours as needed for wheezing or shortness of breath.    [provider]  Azelastine  HCl 137 MCG/SPRAY SOLN Place 1 spray into the nose daily as needed (for nasal congestion).  10/27/18   [provider]  BREZTRI  AEROSPHERE 160-9-4.8 MCG/ACT AERO inhale TWO PUFFS TWICE DAILY morning AND bedtime 06/05/23   Sood, Vineet, MD  carboxymethylcellulose (REFRESH PLUS) 0.5 % SOLN Place 2-4 drops into both eyes as needed (dry eyes).    [provider]  Cholecalciferol (VITAMIN D3) 125 MCG (5000 UT) CAPS Take 1 capsule by mouth in the morning and at bedtime. Taking a day    [provider]   clonazePAM  (KLONOPIN ) 1 MG tablet Take 0.5 tablets (0.5 mg total) by mouth 2 (two) times daily as needed for anxiety. *May take one additional tablet as needed for anxiety/PTSD Patient taking differently: Take 1 mg by mouth in the morning, at noon, and at bedtime. 07/08/19   Pearlean Manus, MD  cyclobenzaprine  (FLEXERIL ) 5 MG tablet Take 5 mg by mouth 2 (two) times daily.    [provider]  fluticasone  (FLONASE ) 50 MCG/ACT nasal spray Place 2 sprays into both nostrils 2 (two) times daily as needed for allergies.    [provider]  gabapentin  (NEURONTIN ) 300 MG capsule Take 600-900 mg by mouth See admin instructions. 900 mg in the morning, 600 mg midday, 900 mg at bedtime    [provider]  loperamide  (IMODIUM ) 2 MG capsule Take 4 mg by mouth daily. And then as needed    [provider]  oxyCODONE -acetaminophen  (PERCOCET) 10-325 MG tablet Take  1 tablet by mouth 4 (four) times daily.    [provider]  OXYGEN  Inhale 3 L into the lungs at bedtime.    [provider]  SANTYL 250 UNIT/GM ointment Apply 1 Application topically daily. APPLY TO CLEANSED AFFECTED AREA BY TOPICAL ROUTE ONCE DAILY 08/13/24   [provider]  White Petrolatum -Mineral Oil (SYSTANE NIGHTTIME) OINT Place 1 Application into both eyes at bedtime.    [provider]    Physical Exam: BP 122/81   Pulse 68   Temp 97.6 F (36.4 C)   Resp 17   SpO2 100%   General exam: Appears calm and comfortable and in no acute distress. Conversant Respiratory: Diminished without wheezing and while mild rales. Respiratory effort normal with no intercostal retractions or use of accessory muscles Cardiovascular: S1 & S2 heard, RRR. No murmurs, rubs, gallops or clicks. No edema Gastrointestinal: Abdomen is non-distended, soft and non-tender. No masses felt. Normal bowel sounds heard Neurologic: Initially lethargic, however this improved after application of nonrebreather  and improvement of oxygen  to where patient was alert and oriented x 3. Musculoskeletal: No calf tenderness Skin: No cyanosis. No new rashes Psychiatry: Memory intact. Mood & affect appropriate   Data Reviewed: There are no new results to review at this time.  Assessment and Plan:  SIRS No source identified at this time.  Patient met criteria with leukocytosis, tachypnea, hypotension, lactic acid of 3.3.  It is possible that she has a noninfectious etiology for her tachypnea and leukocytosis.  Patient started empirically on vancomycin  and meropenem in the ED.  Blood cultures obtained before antibiotics.  Urinalysis obtained after antibiotics. - Vancomycin  and Zosyn - Follow-up blood and urine cultures  Altered mental status Most likely acute toxic-metabolic encephalopathy.  Possibly related to underlying infection, however could be related to home medications. Patient's UDS is positive for benzodiazepine and opiates, which is expected based on home medication regimen.  EEG obtained in the ED for concern of possible seizure activity evidence of seizures.  CT scan of the head unremarkable for acute etiology.  Mental status appears to be back to baseline at this time. - Hold gabapentin  for now since patient has an AKI  Acute on chronic respiratory failure with hypoxia Patient is on 3 L/min via nasal canula at baseline. Patient requiring up to 5 L/min with tachypnea and SpO2 down to 88% during ED evaluation. On my seeing the patient, her SpO2 was down to 82% on 8 L/min with lethargy. Unclear etiology for worsening hypoxia. - Respiratory consultation to start HHFNC and to keep O2 saturation >88-90% - Wean oxygen  to 3 L/min - Ambulatory pulse ox - PT/OT eval - Check d-dimer - CT chest vs CTA chest depending on D-dimer  COPD No wheezing or change in productive cough. Presentation does not appear consistent with an acute exacerbation. - Continue Breztri  and albuterol   Abnormal EKG Patient  with inferior ST elevations initially concerning for STEMI. Cardiology consulted and canceled code STEMI after review of patient's EKG, which is similar to previous and likely related to early repolarization. Troponin mildly elevated at 32 with negative delta of 25. Bedside ultrasound performed and significant for an LVEF of 50-55% with mildly enlarged RV. - Ongoing cardiology recommendations  Hypotension Possibly related to SIRS/infection.  Resolved with IV fluids.  Patient did not need vasopressor support.  AKI Patient's baseline creatinine appears to be around 0.71.  Creatinine of 1.22 on day of admission.  Patient given IV fluids during resuscitation for concern of  sepsis on admission. - Continue IV fluids - BMP in a.m.  Elevated AST/ALT Mild. Unclear etiology at this time.  No abdominal pain.  Patient is not an alcohol  drinker.  Patient reports some viral-like illness, which could be contributory to liver enzyme elevation. - Trend with CMP - May need to obtain ultrasound versus infectious workup if continues to trend up  Polycythemia Likely related to dehydration.  Hemoglobin of 17.5 g/dL on admission. - Continue IV fluids - Repeat CBC in a.m.  Chronic pain Patient with history of ankle surgeries requiring hardware which were recently removed per patient.  Patient is on oxycodone  and gabapentin  for management. - Continue oxycodone  as needed - Hold gabapentin   Tobacco use -Continue nicotine  patch  Critical care time: 50 minutes  Advance Care Planning:   Code Status: Full Code  Consults: Cardiology, Neurology  Family Communication: Husband at bedside   Author: Elgin Lam, MD 11/12/2024 5:14 PM  For on call review [email or web address].

## 2024-11-12 NOTE — Consult Note (Addendum)
 Cardiology Consultation   Patient ID: [name] MRN: [id number]; DOB: [birth year]  Admit date: 11/12/2024 Date of Consult: 11/12/2024  PCP:  Katrinka Aquas, MD   [facility] Providers Cardiologist:  Oneil Parchment, MD       Patient Profile: [name] is a 68 y.o. female with a hx of tobacco use, COPD, IBS, GERD who is being seen 11/12/2024 for the evaluation of abnormal EKG at the request of Dr. Yolande.  History of Present Illness: [name] is a 68 year old female with past medical history noted above.  She has not been evaluated by cardiology in the past.  She is a current tobacco user with history of COPD followed by pulmonology.   Into the ED on 11/28 after being possibly found down on the floor in her home by her husband.  Patient is a poor historian but complains of 2 to 3 days of severe headache and eye pain.  Reports a history of migraines whenever she was perimenopausal but since that time has not had any issues.  Denies any chest pain or shortness of breath.  States she has had also had several days of diarrhea.  No urinary symptoms.  Initially called a code STEMI in the field secondary to J-point elevation in inferior lateral leads though canceled and review of prior EKGs by Dr. Elmira.   Labs on admission show sodium 142, potassium 4.1, creatinine 1.2, AST 101, ALT 68, high-sensitivity troponin 32, WBC 15.1, hemoglobin 17.5, lactic acid 3.3.  CT head and neck with no intracranial abnormality, 0.5 mm anterolisthesis of C4-C5 increased from prior CT with recommendations to consider MRI for ligamentous injury.  Chest x-ray with atelectasis versus scarring.  Blood pressures soft in the 80-90 systolic range while in the ED.  Cardiology asked to evaluate.   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   COPD (chronic obstructive pulmonary disease) (HCC)    Depression     Diverticulosis    GERD (gastroesophageal reflux disease)    IBS (irritable bowel syndrome)    Legally blind in right eye, as defined in [place]     Myelomalacia (HCC)    Oxygen  dependent    uses O2 at 3L/[place] at night   Panic disorder    Panic disorder    Pneumonia    as a child   PTSD (post-traumatic stress disorder)    PTSD (post-traumatic stress disorder) 01/08/2019   Temporomandibular joint disorder (TMJ)    age 46 fractured TMJ - repaired by oral surgeon    Past Surgical History:  Procedure Laterality Date   APPENDECTOMY     CHOLECYSTECTOMY     EYE SURGERY     FRACTURE SURGERY     HARDWARE REMOVAL Right 10/08/2023   Procedure: HARDWARE REMOVAL, right ankle;  Surgeon: Barton Drape, MD;  Location: [facility];  Service: Orthopedics;  Laterality: Right;   HARDWARE REMOVAL Right 06/23/2024   Procedure: REMOVAL, HARDWARE;  Surgeon: Barton Drape, MD;  Location: [facility];  Service: Orthopedics;  Laterality: Right;   LAPAROSCOPIC APPENDECTOMY N/A 01/08/2019   Procedure: APPENDECTOMY LAPAROSCOPIC;  Surgeon: Kallie Manuelita BROCKS, MD;  Location: [facility];  Service: General;  Laterality: N/A;   ORIF ANKLE FRACTURE Right 04/26/2022   Procedure: OPEN REDUCTION INTERNAL FIXATION (ORIF) ANKLE FRACTURE;  Surgeon: Barton Drape, MD;  Location: [facility];  Service: Orthopedics;  Laterality: Right;  Scheduled Meds:  Continuous Infusions:  meropenem (MERREM) IV     vancomycin      PRN Meds:   Allergies:    Allergies  Allergen Reactions   Clindamycin  Nausea And Vomiting and Rash   Augmentin  [Amoxicillin -Pot Clavulanate] Other (See Comments)    GI upset   Ketamine     Pt preference    Naltrexone Other (See Comments)    Hallucinations, sleepiness, withdrawal s/s, poor apepitie    Nsaids     Advised not to take    Toradol  [Ketorolac  Tromethamine ] Nausea And Vomiting and Swelling    TONGUE SWELLING   Tramadol  Nausea And Vomiting   Atorvastatin Other (See  Comments), Itching, Nausea Only and Tinitus   Benadryl [Diphenhydramine Hcl] Palpitations    Insomnia   Ceftin [Cefuroxime Axetil] Rash   Compazine Palpitations    Insomnia   Epinephrine  Palpitations    Insomina    Social History:   Social History   Socioeconomic History   Marital status: Married    Spouse name: Not on file   Number of children: Not on file   Years of education: Not on file   Highest education level: Not on file  Occupational History   Not on file  Tobacco Use   Smoking status: Every Day    Current packs/day: 1.00    Average packs/day: 1 pack/day for 52.0 years (52.0 ttl pk-yrs)    Types: Cigarettes   Smokeless tobacco: Never  Vaping Use   Vaping status: Never Used  Substance and Sexual Activity   Alcohol  use: No   Drug use: No   Sexual activity: Yes    Birth control/protection: Post-menopausal  Other Topics Concern   Not on file  Social History Narrative   Not on file   Social Drivers of Health   Financial Resource Strain: Not on file  Food Insecurity: Not on file  Transportation Needs: Not on file  Physical Activity: Not on file  Stress: Not on file  Social Connections: Not on file  Intimate Partner Violence: Not on file    Family History:    Family History  Problem Relation Age of Onset   Polymyalgia rheumatica Mother    Migraines Mother    Coronary artery disease Father    Breast cancer Neg Hx      ROS:  Please see the history of present illness.   All other ROS reviewed and negative.     Physical Exam/Data: Vitals:   11/12/24 1252 11/12/24 1255 11/12/24 1300 11/12/24 1305  BP:  (!) 89/77 (!) 87/66 92/65  Pulse: 72 70 71 70  Resp: 14 13 (!) 23 (!) 8  Temp:      TempSrc:      SpO2: 92% 93% (!) 89% 90%   No intake or output data in the 24 hours ending 11/12/24 1404    06/23/2024    7:19 AM 06/23/2024    6:08 AM 06/16/2024    1:56 PM  Last 3 Weights  Weight (lbs) 96 lb 4.8 oz 96 lb 4.8 oz 96 lb 4.8 oz  Weight (kg) 43.681 kg  43.681 kg 43.681 kg     There is no height or weight on file to calculate BMI.  General: Frail, ill-appearing older female HEENT: normal Neck: no JVD Vascular: Distal pulses 2+ bilaterally Cardiac:  normal S1, S2; RRR; no murmur  Lungs:  clear to auscultation bilaterally, no wheezing, rhonchi or rales  Abd: soft, nontender, no hepatomegaly  Ext: no edema Musculoskeletal:  No deformities, BUE and BLE strength normal and equal Skin: warm and dry  Neuro:  no focal abnormalities noted  EKG:  The EKG was personally reviewed and demonstrates: Sinus rhythm with J-point elevation in inferior lateral leads (similar to prior tracings)   Relevant CV Studies:  Echo: 04/2022  IMPRESSIONS     1. Left ventricular ejection fraction, by estimation, is 65 to 70%. The  left ventricle has normal function. The left ventricle has no regional  wall motion abnormalities. Left ventricular diastolic parameters are  consistent with Grade I diastolic  dysfunction (impaired relaxation).   2. Right ventricular systolic function is normal. The right ventricular  size is normal. Tricuspid regurgitation signal is inadequate for assessing  PA pressure.   3. The mitral valve is normal in structure. No evidence of mitral valve  regurgitation.   4. The aortic valve is normal in structure. Aortic valve regurgitation is  not visualized.   5. The inferior vena cava is normal in size with greater than 50%  respiratory variability, suggesting right atrial pressure of 3 mmHg.   Comparison(s): No prior Echocardiogram.   Conclusion(s)/Recommendation(s): Normal biventricular function without  evidence of hemodynamically significant valvular heart disease.   FINDINGS   Left Ventricle: Left ventricular ejection fraction, by estimation, is 65  to 70%. The left ventricle has normal function. The left ventricle has no  regional wall motion abnormalities. The left ventricular internal cavity  size was normal in size.  There is   borderline left ventricular hypertrophy. Left ventricular diastolic  parameters are consistent with Grade I diastolic dysfunction (impaired  relaxation).   Right Ventricle: The right ventricular size is normal. Right ventricular  systolic function is normal. Tricuspid regurgitation signal is inadequate  for assessing PA pressure.   Left Atrium: Left atrial size was normal in size.   Right Atrium: Right atrial size was normal in size.   Pericardium: There is no evidence of pericardial effusion.   Mitral Valve: The mitral valve is normal in structure. No evidence of  mitral valve regurgitation.   Tricuspid Valve: The tricuspid valve is normal in structure. Tricuspid  valve regurgitation is not demonstrated.   Aortic Valve: The aortic valve is normal in structure. Aortic valve  regurgitation is not visualized. Aortic valve peak gradient measures 10.5  mmHg.   Pulmonic Valve: The pulmonic valve was grossly normal. Pulmonic valve  regurgitation is not visualized.   Aorta: The aortic root is normal in size and structure.   Venous: The inferior vena cava is normal in size with greater than 50%  respiratory variability, suggesting right atrial pressure of 3 mmHg.   IAS/Shunts: No atrial level shunt detected by color flow Doppler.    Laboratory Data: High Sensitivity Troponin:   Recent Labs  Lab 11/12/24 1250  TROPONINIHS 32*     Chemistry Recent Labs  Lab 11/12/24 1250 11/12/24 1255  NA 142 139  141  K 4.1 3.8  3.8  CL 100 99  CO2 26  --   GLUCOSE 110* 108*  BUN 18 21  CREATININE 1.22* 1.20*  CALCIUM 8.8*  --   MG 2.3  --   GFRNONAA 49*  --   ANIONGAP 16*  --     Recent Labs  Lab 11/12/24 1250  PROT 7.2  ALBUMIN 3.6  AST 101*  ALT 68*  ALKPHOS 133*  BILITOT 0.6   Lipids No results for input(s): CHOL, TRIG, HDL, LABVLDL, LDLCALC, CHOLHDL in the last 168 hours.  Hematology Recent  Labs  Lab 11/12/24 1250 11/12/24 1255  WBC  15.1*  --   RBC 5.47*  --   HGB 17.5* 19.0*  19.0*  HCT 55.9* 56.0*  56.0*  MCV 102.2*  --   MCH 32.0  --   MCHC 31.3  --   RDW 14.2  --   PLT 284  --    Thyroid No results for input(s): TSH, FREET4 in the last 168 hours.  BNPNo results for input(s): BNP, PROBNP in the last 168 hours.  DDimer No results for input(s): DDIMER in the last 168 hours.  Radiology/Studies:  CT HEAD WO CONTRAST Result Date: 11/12/2024 EXAM: CT HEAD WITHOUT 11/12/2024 01:42:26 PM TECHNIQUE: CT of the head was performed without the administration of intravenous contrast. Automated exposure control, iterative reconstruction, and/or weight based adjustment of the mA/kV was utilized to reduce the radiation dose to as low as reasonably achievable. COMPARISON: 05/18/2022 CLINICAL HISTORY: headache syncope FINDINGS: BRAIN AND VENTRICLES: No acute intracranial hemorrhage. No mass effect or midline shift. No extra-axial fluid collection. No evidence of acute infarct. No hydrocephalus. Mild atherosclerosis of the carotid siphons. ORBITS: No acute abnormality. SINUSES AND MASTOIDS: There is mild mucosal thickening in the partially visualized ethmoid and maxillary sinuses. SOFT TISSUES AND SKULL: No acute skull fracture. No acute soft tissue abnormality. IMPRESSION: 1. No acute intracranial abnormality. 2. Mild mucosal thickening in the partially visualized ethmoid and maxillary sinuses. Electronically signed by: Donnice Mania MD 11/12/2024 01:57 PM EST RP Workstation: [id number]   DG Chest Port 1 View Result Date: 11/12/2024 CLINICAL DATA:  Syncope. EXAM: PORTABLE CHEST 1 VIEW COMPARISON:  04/24/2022 and older exams.  CT, 08/12/2023. FINDINGS: Cardiac silhouette is normal in size. No mediastinal or hilar masses. Lungs are hyperexpanded. Prominent interstitial markings, chronic. Mild reticulonodular opacities in the left lateral lung base consistent with atelectasis/scarring. No evidence of pneumonia or pulmonary edema.  No pleural effusion or pneumothorax. Skeletal structures are grossly intact. IMPRESSION: No active disease. Electronically Signed   By: Alm Parkins M.D.   On: 11/12/2024 13:40     Assessment and Plan:  YEKATERINA ESCUTIA is a 68 y.o. female with a hx of tobacco use, COPD, IBS, GERD who is being seen 11/12/2024 for the evaluation of abnormal EKG at the request of Dr. Yolande.  Abnormal EKG -- Initially called a code STEMI in the field secondary to J-point elevation in inferior lateral leads.  This was canceled after review of prior EKGs by Dr. Elmira.  She has not experienced any chest pain prior to admission.  Suspect changes related to early repolarization. -- hsTn 32 -- Bedside ultrasound done by Dr. Jeffrie with no pericardial effusion, LVEF approximately 50-55%, RV appeared to be mildly enlarged.   Sepsis? -- presented after possibly being found down on the floor by her husband.  Patient is a poor historian. -- WBC 15.1, lactic acid 3.3 -- Started on broad-spectrum antibiotics per EDP -- EDP reported possible seizure-like activity, EEG in process  Tobacco use  COPD -- Ongoing tobacco use, follows with pulmonary as an outpatient   For questions or updates, please contact [facility] Please consult [email or web address] for contact info under   Signed, Manuelita Rummer, NP  11/12/2024 2:04 PM  Personally seen and examined. Agree with above.  68 year old female with COPD tobacco use who was found down at home by her husband, lethargic.  Reported a history of migraines and headache over the last 3 days.  She has also had several days of diarrhea.  No dysuria.  No chest pain no shortness of breath.  Initial call was STEMI secondary to ST elevation noted in the inferior lateral leads of her EKG however ST elevations were noted previously in the inferior leads and are likely representative of metabolic derangement.  Lactic acid was 3.3.  ALT 68.  Hemoglobin 17.5.  Creatinine 1.2.   Chest x-ray unremarkable.  Blood pressures in the ER were in the 80s systolic.  Lethargic.  Prior echo in 2023 showed EF of 65%.  Troponin 32, creatinine 1.22  Distributive shock - Question possible systemic infection.  Note where the headache for 3 days.  Lactic acid 3.3, white count 15.  Broad-spectrum antibiotics have been started.  Neurology has been consulted.  EEG beginning. -Continue with lactated Ringer 's.  Appears extremely dry.  IVC narrow.  Pressors as needed.  Abnormal EKG - There are J-point elevations in the inferior lateral leads.  Not a STEMI.  Bedside echocardiogram does not show any evidence of wall motion abnormality.  EF is approximately 50 to 55%.  RV appears to be mildly enlarged secondary to underlying lung disease.  Tobacco use and COPD - Continue to work on tobacco cessation.  CRITICAL CARE Performed by: Oneil Parchment   Total critical care time: 45 minutes  Critical care time was exclusive of separately billable procedures and treating other patients.  Critical care was necessary to treat or prevent imminent or life-threatening deterioration.  Critical care was time spent personally by me on the following activities: development of treatment plan with patient and/or surrogate as well as nursing, discussions with consultants, evaluation of patient's response to treatment, examination of patient, obtaining history from patient or surrogate, ordering and performing treatments and interventions, ordering and review of laboratory studies, ordering and review of radiographic studies, pulse oximetry and re-evaluation of patient's condition.   Oneil Parchment, MD

## 2024-11-12 NOTE — ED Notes (Signed)
 EDP at [facility]

## 2024-11-12 NOTE — ED Provider Notes (Signed)
 [place] EMERGENCY DEPARTMENT AT [facility] Provider Note   CSN: [id number] Arrival date & time: 11/12/24  1226     Patient presents with: Loss of Consciousness   [name] is a 68 y.o. female.   68 year old female history of cervical myelopathy, COPD on 3 L nasal cannula, pneumonia, PTSD, skin cancer, and right eye blindness who presents to the emergency department with loss of consciousness.  History obtained per EMS and per patient.  Per the patient for the past 3 days she has had a headache.  Gradual in onset.  Holocephalic.  Also is having pain around her left eye and blurry vision.  Per EMS they were called because of loss of consciousness.  Unable to give additional history.  No seizure-like activity was noted.  No bowel or bladder incontinence or tongue bite.  They obtained an EKG that showed an inferior STEMI and code STEMI was activated in the field.  Cardiology subsequently canceled the code STEMI.  Was noted to be hypotensive for EMS with a blood pressure of 86/56.  They were unable to get access en route. Denies chest pain or shortness of breath       Prior to Admission medications   Medication Sig Start Date End Date Taking? Authorizing Provider  atorvastatin (LIPITOR) 20 MG tablet Take 20 mg by mouth daily. 11/03/24  Yes [provider]  acetaminophen  (TYLENOL ) 500 MG tablet Take 1,000 mg by mouth every 8 (eight) hours as needed (pain).    [provider]  albuterol  (PROVENTIL ) (2.5 MG/3ML) 0.083% nebulizer solution Take 3 mLs (2.5 mg total) by nebulization every 6 (six) hours as needed for wheezing or shortness of breath. 11/12/22   Sood, Vineet, MD  albuterol  (VENTOLIN  HFA) 108 (90 Base) MCG/ACT inhaler Inhale 1-2 puffs into the lungs every 6 (six) hours as needed for wheezing or shortness of breath.    [provider]  Azelastine  HCl 137 MCG/SPRAY SOLN Place 1 spray into the nose daily as needed (for nasal congestion).   10/27/18   [provider]  BREZTRI  AEROSPHERE 160-9-4.8 MCG/ACT AERO inhale TWO PUFFS TWICE DAILY morning AND bedtime 06/05/23   Sood, Vineet, MD  carboxymethylcellulose (REFRESH PLUS) 0.5 % SOLN Place 2-4 drops into both eyes as needed (dry eyes).    [provider]  Cholecalciferol (VITAMIN D3) 125 MCG (5000 UT) CAPS Take 1 capsule by mouth in the morning and at bedtime. Taking a day    [provider]  clonazePAM  (KLONOPIN ) 1 MG tablet Take 0.5 tablets (0.5 mg total) by mouth 2 (two) times daily as needed for anxiety. *May take one additional tablet as needed for anxiety/PTSD Patient taking differently: Take 1 mg by mouth in the morning, at noon, and at bedtime. 07/08/19   Pearlean Manus, MD  cyclobenzaprine  (FLEXERIL ) 5 MG tablet Take 5 mg by mouth 2 (two) times daily.    [provider]  fluticasone  (FLONASE ) 50 MCG/ACT nasal spray Place 2 sprays into both nostrils 2 (two) times daily as needed for allergies.    [provider]  gabapentin  (NEURONTIN ) 300 MG capsule Take 600-900 mg by mouth See admin instructions. 900 mg in the morning, 600 mg midday, 900 mg at bedtime    [provider]  loperamide  (IMODIUM ) 2 MG capsule Take 4 mg by mouth daily. And then as needed    [provider]  oxyCODONE -acetaminophen  (PERCOCET) 10-325 MG tablet Take 1 tablet by mouth 4 (four) times daily.  [provider]  OXYGEN  Inhale 3 L into the lungs at bedtime.    [provider]  SANTYL 250 UNIT/GM ointment Apply 1 Application topically daily. APPLY TO CLEANSED AFFECTED AREA BY TOPICAL ROUTE ONCE DAILY 08/13/24   [provider]  White Petrolatum -Mineral Oil (SYSTANE NIGHTTIME) OINT Place 1 Application into both eyes at bedtime.    [provider]    Allergies: Clindamycin , Augmentin  [amoxicillin -pot clavulanate], Ketamine, Naltrexone, Nsaids, Toradol  [ketorolac  tromethamine ], Tramadol , Atorvastatin, Benadryl  [diphenhydramine hcl], Ceftin [cefuroxime axetil], Compazine, and Epinephrine     Review of Systems  Updated Vital Signs BP 123/80   Pulse 70   Temp (!) 97.2 F (36.2 C) (Oral)   Resp (!) 21   SpO2 96%   Physical Exam Vitals and nursing note reviewed.  Constitutional:      General: She is not in acute distress.    Appearance: She is well-developed.  HENT:     Head: Normocephalic and atraumatic.     Right Ear: External ear normal.     Left Ear: External ear normal.     Nose: Nose normal.  Eyes:     Extraocular Movements: Extraocular movements intact.     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  Left eye 20/70  Female with history of chronic left eye pressure 12 mmHg  L eye conjunctival injection  L pupil 5 mm and reactive  Cardiovascular:     Rate and Rhythm: Normal rate and regular rhythm.     Heart sounds: No murmur heard. Pulmonary:     Effort: Pulmonary effort is normal. No respiratory distress.     Breath sounds: Normal breath sounds.  Musculoskeletal:     Cervical back: Normal range of motion and neck supple.     Right lower leg: No edema.     Left lower leg: No edema.  Skin:    General: Skin is warm and dry.  Neurological:     Mental Status: She is alert and oriented to person, place, and time. Mental status is at baseline.     Cranial Nerves: No cranial nerve deficit.     Sensory: No sensory deficit.     Motor: No weakness.  Psychiatric:        Mood and Affect: Mood normal.     (all labs ordered are listed, but only abnormal results are displayed) Labs Reviewed  COMPREHENSIVE METABOLIC PANEL WITH GFR - Abnormal; Notable for the following components:      Result Value   Glucose, Bld 110 (*)    Creatinine, Ser 1.22 (*)    Calcium 8.8 (*)    AST 101 (*)    ALT 68 (*)    Alkaline Phosphatase 133 (*)    GFR, Estimated 49 (*)    Anion gap 16 (*)    All other components within normal limits  CBC WITH DIFFERENTIAL/PLATELET -  Abnormal; Notable for the following components:   WBC 15.1 (*)    RBC 5.47 (*)    Hemoglobin 17.5 (*)    HCT 55.9 (*)    MCV 102.2 (*)    Neutro Abs 12.9 (*)    Abs Immature Granulocytes 0.08 (*)    All other components within normal limits  CBG MONITORING, ED - Abnormal; Notable for the following components:   Glucose-Capillary 112 (*)    All other components within normal limits  CBG MONITORING, ED - Abnormal; Notable for the following components:   Glucose-Capillary 105 (*)  All other components within normal limits  I-STAT CHEM 8, ED - Abnormal; Notable for the following components:   Creatinine, Ser 1.20 (*)    Glucose, Bld 108 (*)    Calcium, Ion 1.00 (*)    Hemoglobin 19.0 (*)    HCT 56.0 (*)    All other components within normal limits  I-STAT CG4 LACTIC ACID, ED - Abnormal; Notable for the following components:   Lactic Acid, Venous 3.3 (*)    All other components within normal limits  I-STAT VENOUS BLOOD GAS, ED - Abnormal; Notable for the following components:   pO2, Ven 21 (*)    Bicarbonate 31.9 (*)    TCO2 34 (*)    Acid-Base Excess 4.0 (*)    Calcium, Ion 0.98 (*)    HCT 56.0 (*)    Hemoglobin 19.0 (*)    All other components within normal limits  TROPONIN I (HIGH SENSITIVITY) - Abnormal; Notable for the following components:   Troponin I (High Sensitivity) 32 (*)    All other components within normal limits  RESP PANEL BY RT-PCR (RSV, FLU A&B, COVID)  RVPGX2  CULTURE, BLOOD (ROUTINE X 2)  CULTURE, BLOOD (ROUTINE X 2)  ETHANOL  MAGNESIUM  RAPID URINE DRUG SCREEN, HOSP PERFORMED  URINALYSIS, ROUTINE W REFLEX MICROSCOPIC  CK  I-STAT CG4 LACTIC ACID, ED  TROPONIN I (HIGH SENSITIVITY)    EKG: EKG Interpretation Date/Time:  Friday November 12 2024 13:47:44 EST Ventricular Rate:  70 PR Interval:  159 QRS Duration:  111 QT Interval:  464 QTC Calculation: 501 R Axis:   80  Text Interpretation: Sinus rhythm Inferior infarct, acute (LCx) Minimal ST  elevation, anterior leads Lateral leads are also involved Prolonged QT interval >>> Acute MI <<< Confirmed by Yolande Charleston [phone]) on 11/12/2024 2:38:36 PM  Radiology: EEG adult Result Date: 11/12/2024 Gregg Lek, MD     11/12/2024  2:56 PM Patient Name: VONNA BRABSON MRN: [id number] Epilepsy Attending: Lek Gregg Referring Physician/Provider: No ref. provider found     Date: 11/12/2024 Duration: 25 minutes Patient history: 69 with altered mental status, EEG to evaluate for seizure Level of alertness: Awake, drowsy AEDs during EEG study: None Technical aspects: This EEG study was done with scalp electrodes positioned according to the 10-20 International system of electrode placement. Electrical activity was reviewed with band pass filter of 1-70Hz , sensitivity of 7 uV/mm, display speed of 74mm/sec with a 60Hz  notched filter applied as appropriate. EEG data were recorded continuously and digitally stored.  Video monitoring was available and reviewed as appropriate. Description: . EEG showed continuous generalized polymorphic sharply contoured 4 -6 Hz theta slowing. Sleep was not seen. Hyperventilation and photic stimulation were not performed.   ABNORMALITY - Continuous slow, generalized IMPRESSION: This study is suggestive of mild diffuse brain dysfunction such as encephalopathy, nonspecific etiology but likely related to sedation, toxic-metabolic etiology. No seizures or epileptiform discharges were seen throughout the recording. Lek Gregg MD Neurology    CT CERVICAL SPINE WO CONTRAST Result Date: 11/12/2024 EXAM: CT CERVICAL SPINE WITHOUT CONTRAST 11/12/2024 01:42:26 PM TECHNIQUE: CT of the cervical spine was performed without the administration of intravenous contrast. Multiplanar reformatted images are provided for review. Automated exposure control, iterative reconstruction, and/or weight based adjustment of the mA/kV was utilized to reduce the radiation dose to as low as reasonably  achievable. COMPARISON: Prior CT dated 05/18/2022. CLINICAL HISTORY: FINDINGS: CERVICAL SPINE: BONES AND ALIGNMENT: Reversal of the normal cervical lordosis which is likely at least partially positional  in etiology. Trace anterolisthesis of C3 on C4. There is 3.5 mm anterolisthesis of C4 on C5 which appears increased from the prior CT. No facet subluxation or dislocation. No acute fracture. DEGENERATIVE CHANGES: Moderate disc space narrowing at C5-C6 and C6-C7 similar to prior. Degenerative endplate osteophytes in the lower cervical spine. Facet arthrosis and uncovertebral hypertrophy at multiple levels. There is no high grade osseous spinal canal stenosis. SOFT TISSUES: No prevertebral soft tissue swelling. LUNGS: Centrilobular emphysema in the upper lobes. IMPRESSION: 1. 3.5 mm anterolisthesis of C4 on C5, increased from prior CT at which time it measured approximately 2mm. Recommend correlation with tenderness at this level and consider MRI to evaluate for ligamentous injury. 2. No acute fracture. Electronically signed by: Donnice Mania MD 11/12/2024 02:04 PM EST RP Workstation: [id number]   CT HEAD WO CONTRAST Result Date: 11/12/2024 EXAM: CT HEAD WITHOUT 11/12/2024 01:42:26 PM TECHNIQUE: CT of the head was performed without the administration of intravenous contrast. Automated exposure control, iterative reconstruction, and/or weight based adjustment of the mA/kV was utilized to reduce the radiation dose to as low as reasonably achievable. COMPARISON: 05/18/2022 CLINICAL HISTORY: headache syncope FINDINGS: BRAIN AND VENTRICLES: No acute intracranial hemorrhage. No mass effect or midline shift. No extra-axial fluid collection. No evidence of acute infarct. No hydrocephalus. Mild atherosclerosis of the carotid siphons. ORBITS: No acute abnormality. SINUSES AND MASTOIDS: There is mild mucosal thickening in the partially visualized ethmoid and maxillary sinuses. SOFT TISSUES AND SKULL: No acute skull fracture.  No acute soft tissue abnormality. IMPRESSION: 1. No acute intracranial abnormality. 2. Mild mucosal thickening in the partially visualized ethmoid and maxillary sinuses. Electronically signed by: Donnice Mania MD 11/12/2024 01:57 PM EST RP Workstation: [id number]   DG Chest Port 1 View Result Date: 11/12/2024 CLINICAL DATA:  Syncope. EXAM: PORTABLE CHEST 1 VIEW COMPARISON:  04/24/2022 and older exams.  CT, 08/12/2023. FINDINGS: Cardiac silhouette is normal in size. No mediastinal or hilar masses. Lungs are hyperexpanded. Prominent interstitial markings, chronic. Mild reticulonodular opacities in the left lateral lung base consistent with atelectasis/scarring. No evidence of pneumonia or pulmonary edema. No pleural effusion or pneumothorax. Skeletal structures are grossly intact. IMPRESSION: No active disease. Electronically Signed   By: Alm Parkins M.D.   On: 11/12/2024 13:40     .Ultrasound ED Peripheral IV (Provider)  Date/Time: 11/12/2024 1:29 PM  Performed by: Yolande Lamar BROCKS, MD Authorized by: Yolande Lamar BROCKS, MD   Procedure details:    Indications: multiple failed IV attempts and poor IV access     Skin Prep: chlorhexidine  gluconate     Location:  Right AC   Angiocath:  20 G   Bedside Ultrasound Guided: Yes     Images: not archived     Patient tolerated procedure without complications: Yes     Dressing applied: Yes      Medications Ordered in the ED  nicotine  (NICODERM CQ  - dosed in mg/24 hours) patch 21 mg (21 mg Transdermal Patch Applied 11/12/24 1551)  albuterol  (PROVENTIL ) (2.5 MG/3ML) 0.083% nebulizer solution 2.5 mg (has no administration in time range)  budesonide -glycopyrrolate -formoterol  (BREZTRI ) 160-9-4.8 MCG/ACT inhaler 2 puff (has no administration in time range)  lactated ringers  bolus 1,000 mL (0 mLs Intravenous Stopped 11/12/24 1527)  vancomycin  (VANCOCIN ) IVPB 1000 mg/200 mL premix (0 mg Intravenous Stopped 11/12/24 1549)  lactated ringers  bolus 1,000 mL  (0 mLs Intravenous Stopped 11/12/24 1527)  meropenem (MERREM) 2 g in sodium chloride  0.9 % 100 mL IVPB (0 g Intravenous Stopped 11/12/24 1527)  fluorescein  ophthalmic strip 1 strip (1 strip Both Eyes Given 11/12/24 1551)  tetracaine (PONTOCAINE) 0.5 % ophthalmic solution 1 drop (1 drop Left Eye Given 11/12/24 1551)    Clinical Course as of 11/12/24 1631  Fri Nov 12, 2024  1326 Attempted calling husband 3 times without response.  Did leave voicemail. [RP]  1326 Cardiology consulted since they have not yet come to the emergency department [RP]  1342 Discussed with neurology who will place orders for a spot EEG [RP]  1359 Neurology Dr Nichola consulted after patient had an episode where she went unresponsive.  Did desaturate to the 17s.  Was in sinus rhythm and this occurred.  No convulsive activity was noted but concern for possible seizure that could have caused her loss of consciousness here and at home.  He will perform a spot EEG.  Dr. Jeffrie from cardiology has also evaluated the patient.  Feels that STEMI is highly unlikely.  Recommends proceeding with medical management for other problems but does not need to go to the Cath Lab or be started on heparin.   [RP]  1401 Dr Claudene from critical care consulted for admission.  [RP]  1426 Critical care at the bedside.  [RP]  1438 Creatinine(!): 1.22 Baseline 0.7 [RP]  1512 Dr Meade from ICU has been consulted.  Feels the patient is likely stable for hospitalist admission. [RP]  1553 Discussed with Dr Tobie from ophthalmology who will see the patient in the morning. [RP]  1628 Discussed with Dr Briana from hospitalist for admission.  [RP]    Clinical Course User Index [RP] Yolande Lamar BROCKS, MD                                 Medical Decision Making Amount and/or Complexity of Data Reviewed Labs: ordered. Decision-making details documented in ED Course. Radiology: ordered.  Risk Prescription drug management. Decision regarding  hospitalization.   [name] is a 68 year old female history of cervical myelopathy, COPD on 3 L nasal cannula, pneumonia, PTSD, skin cancer, and right eye blindness who presents to the emergency department with loss of consciousness.   Initial Ddx:  Seizure, syncope, ich, stroke, STEMI, arrhythmia  MDM/Course:  Patient presents to the emergency department with loss of consciousness.  Initially had very limited history but EMS reported that the were called for an unconscious individual.  Patient woke up on the way over and the same and she has had headache for 3 days.  Husband max was later able to be contacted. At 0830 am she was unable to be woken up. He tried for 2 hours without success. No shaking or seizure like activity noted. No bowel or bladder incontinence. Spoke briefly but they would go back to sleep. Has been having flu like symptoms and weakness recently. Says she felt like when she was going to sleep she wouldn't wake up.  EKG obtained by EMS in the field did show an inferior STEMI.  Code STEMI was initially activated but cardiology canceled it.  They came to evaluate her at the bedside and feel that ischemia is unlikely and that her EKG appears similar to prior.  On exam she does not have any neurologic deficits aside from the blindness and abnormal EOMs of her right eye which are chronic.  She was somewhat confused but over time came back to her baseline.  She underwent a CT head and C-spine due to the limited available history.  CT head without acute abnormality.  CT of the cervical spine did show anterior listhesis has been present but somewhat worsened.  Went back and evaluated the patient and she is not having any midline C-spine tenderness to palpation reports no recent trauma so feel that this is not an acute problem.  Chest x-ray without acute abnormality.  Blood work shows mild leukocytosis.  Also an AKI and minimally elevated LFTs.  Patient not having any abdominal tenderness  palpation or pain on repeat examination.  COVID and flu negative.  Talk to ophthalmology about her eye pain and vision changes since she is only able to see out of 1 eye.  They will come see her tomorrow.  Upon re-evaluation patient did have an episode where she went unconscious for 3 to 4 minutes and became apneic and had to be placed on nonrebreather mask.  She gradually regained consciousness.  Did talk to neurology about this in case of seizure and they performed a spot EEG afterwards that showed signs of generalized encephalopathy but no seizure-like activity.  Did discuss with the ICU after this who has evaluated the patient and feels that since she has been stable for several hours since then can go to the floor.  This patient presents to the ED for concern of complaints listed in HPI, this involves an extensive number of treatment options, and is a complaint that carries with it a high risk of complications and morbidity. Disposition including potential need for admission considered.   Dispo: Admit to Floor  Additional history obtained from spouse Records reviewed Outpatient Clinic Notes The following labs were independently interpreted: Chemistry and show AKI I independently reviewed the following imaging with scope of interpretation limited to determining acute life threatening conditions related to emergency care: CT Head and agree with the radiologist interpretation with the following exceptions: none I personally reviewed and interpreted cardiac monitoring: normal sinus rhythm  I personally reviewed and interpreted the pt's EKG: see above for interpretation  I have reviewed the patients home medications and made adjustments as needed Consults: Cardiology, Critical care, Hospitalist, and Neurology Social Determinants of health:  Geriatric  Portions of this note were generated with Scientist, clinical (histocompatibility and immunogenetics). Dictation errors may occur despite best attempts at proofreading.    Final  diagnoses:  Loss of consciousness (HCC)  AKI (acute kidney injury)  Pain of left eye  Nonintractable headache, unspecified chronicity pattern, unspecified headache type    ED Discharge Orders     None          Yolande Lamar BROCKS, MD 11/12/24 1610

## 2024-11-12 NOTE — Procedures (Signed)
 Patient Name: [name]  MRN: [id number]  Epilepsy Attending: Pastor Falling  Referring Physician/Provider: No ref. provider found      Date: 11/12/2024 Duration: 25 minutes   Patient history: 35 with altered mental status, EEG to evaluate for seizure   Level of alertness: Awake, drowsy  AEDs during EEG study: None   Technical aspects: This EEG study was done with scalp electrodes positioned according to the 10-20 International system of electrode placement. Electrical activity was reviewed with band pass filter of 1-70Hz , sensitivity of 7 uV/mm, display speed of 6mm/sec with a 60Hz  notched filter applied as appropriate. EEG data were recorded continuously and digitally stored.  Video monitoring was available and reviewed as appropriate.  Description: . EEG showed continuous generalized polymorphic sharply contoured 4 -6 Hz theta slowing. Sleep was not seen. Hyperventilation and photic stimulation were not performed.     ABNORMALITY - Continuous slow, generalized  IMPRESSION: This study is suggestive of mild diffuse brain dysfunction such as encephalopathy, nonspecific etiology but likely related to sedation, toxic-metabolic etiology. No seizures or epileptiform discharges were seen throughout the recording.   Pastor Falling MD Neurology

## 2024-11-12 NOTE — Progress Notes (Signed)
 Pharmacy Antibiotic Note  [name] is a 68 y.o. female for which pharmacy has been consulted for vancomycin  dosing for sepsis.  AKI  Meropenem given x 1 in ED for c/f CNS infection by EDP.   Augmentin  allergy noted >> GI upset. Has tolerated unasyn  in the past. Ceftin allergy noted >> rash  Plan: Zosyn 3.375g IV q8h (4 hour infusion) Vancomycin  1000 mg once, subsequent dosing as indicated per random vancomycin  level until renal function stable and/or improved, at which time scheduled dosing can be considered Monitor WBC, fever, renal function, cultures De-escalate when able     Temp (24hrs), Avg:97.4 F (36.3 C), Min:97.2 F (36.2 C), Max:97.6 F (36.4 C)  Recent Labs  Lab 11/12/24 1250 11/12/24 1255 11/12/24 1533  WBC 15.1*  --   --   CREATININE 1.22* 1.20*  --   LATICACIDVEN  --  3.3* 1.9    CrCl cannot be calculated (Unknown ideal weight.).    Allergies  Allergen Reactions   Clindamycin  Nausea And Vomiting and Rash   Augmentin  [Amoxicillin -Pot Clavulanate] Other (See Comments)    GI upset   Ketamine Other (See Comments)    Made me act crazy   Naltrexone Other (See Comments)    Hallucinations, sleepiness, withdrawal s/s, poor apepitie    Nsaids     Advised not to take    Toradol  [Ketorolac  Tromethamine ] Nausea And Vomiting and Swelling    TONGUE SWELLING   Tramadol  Nausea And Vomiting   Atorvastatin Other (See Comments), Itching, Nausea Only and Tinitus   Benadryl [Diphenhydramine Hcl] Palpitations    Insomnia   Ceftin [Cefuroxime Axetil] Rash   Compazine Palpitations    Insomnia   Epinephrine  Palpitations    Insomina   Microbiology results: Pending  Thank you for allowing pharmacy to be a part of this patient's care.  Dorn Buttner, PharmD, BCPS 11/12/2024 5:32 PM ED Clinical Pharmacist -  [phone]

## 2024-11-13 ENCOUNTER — Inpatient Hospital Stay (HOSPITAL_COMMUNITY)

## 2024-11-13 DIAGNOSIS — I5030 Unspecified diastolic (congestive) heart failure: Secondary | ICD-10-CM | POA: Diagnosis not present

## 2024-11-13 DIAGNOSIS — R6510 Systemic inflammatory response syndrome (SIRS) of non-infectious origin without acute organ dysfunction: Secondary | ICD-10-CM | POA: Diagnosis not present

## 2024-11-13 DIAGNOSIS — R9431 Abnormal electrocardiogram [ECG] [EKG]: Secondary | ICD-10-CM | POA: Diagnosis not present

## 2024-11-13 DIAGNOSIS — J9621 Acute and chronic respiratory failure with hypoxia: Secondary | ICD-10-CM | POA: Diagnosis not present

## 2024-11-13 LAB — COMPREHENSIVE METABOLIC PANEL WITH GFR
ALT: 50 U/L — ABNORMAL HIGH (ref 0–44)
AST: 59 U/L — ABNORMAL HIGH (ref 15–41)
Albumin: 2.8 g/dL — ABNORMAL LOW (ref 3.5–5.0)
Alkaline Phosphatase: 108 U/L (ref 38–126)
Anion gap: 10 (ref 5–15)
BUN: 16 mg/dL (ref 8–23)
CO2: 30 mmol/L (ref 22–32)
Calcium: 8.1 mg/dL — ABNORMAL LOW (ref 8.9–10.3)
Chloride: 99 mmol/L (ref 98–111)
Creatinine, Ser: 0.77 mg/dL (ref 0.44–1.00)
GFR, Estimated: >60 mL/min (ref >60)
Glucose, Bld: 87 mg/dL (ref 70–99)
Potassium: 3.7 mmol/L (ref 3.5–5.1)
Sodium: 139 mmol/L (ref 135–145)
Total Bilirubin: 0.6 mg/dL (ref 0.0–1.2)
Total Protein: 5.6 g/dL — ABNORMAL LOW (ref 6.5–8.1)

## 2024-11-13 LAB — CBC
HCT: 45.6 % (ref 36.0–46.0)
Hemoglobin: 14.9 g/dL (ref 12.0–15.0)
MCH: 31.4 pg (ref 26.0–34.0)
MCHC: 32.7 g/dL (ref 30.0–36.0)
MCV: 96.2 fL (ref 80.0–100.0)
Platelets: 294 K/uL (ref 150–400)
RBC: 4.74 MIL/uL (ref 3.87–5.11)
RDW: 13.9 % (ref 11.5–15.5)
WBC: 13.6 K/uL — ABNORMAL HIGH (ref 4.0–10.5)
nRBC: 0 % (ref 0.0–0.2)

## 2024-11-13 LAB — HIV ANTIBODY (ROUTINE TESTING W REFLEX): HIV Screen 4th Generation wRfx: NONREACTIVE

## 2024-11-13 LAB — D-DIMER, QUANTITATIVE: D-Dimer, Quant: 3.12 ug{FEU}/mL — ABNORMAL HIGH (ref 0.00–0.50)

## 2024-11-13 MED ORDER — CLONAZEPAM 0.5 MG PO TABS
1.0000 mg | ORAL_TABLET | Freq: Three times a day (TID) | ORAL | Status: DC | PRN
  Administered 2024-11-13 – 2024-11-14 (×3): 1 mg via ORAL
  Filled 2024-11-13 (×3): qty 2

## 2024-11-13 MED ORDER — VANCOMYCIN HCL 750 MG/150ML IV SOLN
750.0000 mg | INTRAVENOUS | Status: DC
  Administered 2024-11-13: 750 mg via INTRAVENOUS
  Filled 2024-11-13 (×2): qty 150

## 2024-11-13 MED ORDER — LOSARTAN POTASSIUM 25 MG PO TABS
12.5000 mg | ORAL_TABLET | Freq: Every day | ORAL | Status: DC
  Administered 2024-11-13 – 2024-11-14 (×2): 12.5 mg via ORAL
  Filled 2024-11-13 (×2): qty 1

## 2024-11-13 MED ORDER — IOHEXOL 350 MG/ML SOLN
75.0000 mL | Freq: Once | INTRAVENOUS | Status: AC | PRN
  Administered 2024-11-13: 75 mL via INTRAVENOUS

## 2024-11-13 NOTE — Plan of Care (Signed)
  Problem: Respiratory: Goal: Ability to maintain adequate ventilation will improve Outcome: Progressing   Problem: Activity: Goal: Risk for activity intolerance will decrease Outcome: Progressing   Problem: Nutrition: Goal: Adequate nutrition will be maintained Outcome: Progressing   Problem: Pain Managment: Goal: General experience of comfort will improve and/or be controlled Outcome: Progressing

## 2024-11-13 NOTE — Progress Notes (Signed)
 PT Cancellation Note  Patient Details Name: [name] MRN: [id number] DOB: [birth year]   Cancelled Treatment:    Reason Eval/Treat Not Completed: Patient declined, no reason specified  Declines to participate with either therapy evaluations today. Explained role of therapies during acute admission to assist with dispo planning and needs. Still refused. States she is not going to move because: She did not sleep well, she has broken her ankle previously, she has enough equipment at home, etc.  States we can come back tomorrow while she thinks about it. Not happy with therapy visiting and wanted to know why it was ordered. Explained again. Requesting a wheelchair to take home.  Will attempt again tomorrow as schedule permits.  Leontine Roads, PT, DPT [facility] Physical Therapist Office: [phone] Website: [email or web address]   Leontine GORMAN Roads 11/13/2024, 12:35 PM

## 2024-11-13 NOTE — Progress Notes (Signed)
 OT Cancellation Note  Patient Details Name: [name] MRN: [id number] DOB: Aug 08, 1956   Cancelled Treatment:    Reason Eval/Treat Not Completed: Patient declined, no reason specified (Pt declining participation in OT eval this day. OT to reattempt to see pt at a later time as available/appropriate.)  Margarie Rockey HERO., OTR/L, MA Acute Rehab [phone]   [name] 11/13/2024, 12:39 PM

## 2024-11-13 NOTE — Progress Notes (Addendum)
 Pharmacy Antibiotic Note  [name] is a 68 y.o. female for which pharmacy has been consulted for vancomycin  dosing for sepsis.  AKI while in ED has resolved. Scr now near baseline (0.71). Will dose vancomycin  based on AUC. Continue Zosyn   Plan: Start Vancomycin  750 mg Q24H (eAUC 529) Monitor renal function, WBC, vitals Deescalate as able Vanc levels as needed  Weight: 43.7 kg (96 lb 5.5 oz)  Temp (24hrs), Avg:97.8 F (36.6 C), Min:97.5 F (36.4 C), Max:98.1 F (36.7 C)  Recent Labs  Lab 11/12/24 1250 11/12/24 1255 11/12/24 1533 11/13/24 0315  WBC 15.1*  --   --  13.6*  CREATININE 1.22* 1.20*  --  0.77  LATICACIDVEN  --  3.3* 1.9  --     Estimated Creatinine Clearance: 47.1 mL/min (by C-G formula based on SCr of 0.77 mg/dL).    Allergies  Allergen Reactions   Clindamycin  Nausea And Vomiting and Rash   Augmentin  [Amoxicillin -Pot Clavulanate] Other (See Comments)    GI upset   Ketamine Other (See Comments)    Made me act crazy   Naltrexone Other (See Comments)    Hallucinations, sleepiness, withdrawal s/s, poor apepitie    Nsaids     Advised not to take    Toradol  [Ketorolac  Tromethamine ] Nausea And Vomiting and Swelling    TONGUE SWELLING   Tramadol  Nausea And Vomiting   Atorvastatin Other (See Comments), Itching, Nausea Only and Tinitus   Benadryl [Diphenhydramine Hcl] Palpitations    Insomnia   Ceftin [Cefuroxime Axetil] Rash   Compazine Palpitations    Insomnia   Epinephrine  Palpitations    Insomina   Microbiology results: 11/28 bcx: NG<24H  Antibiotics: 11/29-c Zosyn  11/28-c Vancomycin  11/28 Meropenem  x1  Prentice DOROTHA Favors, PharmD PGY1 Health-System Pharmacy Administration and Leadership Resident [facility]  11/13/2024 1:21 PM

## 2024-11-13 NOTE — Progress Notes (Signed)
 PROGRESS NOTE    [name]  FMW:[id number] DOB: 1956/02/17 DOA: 11/12/2024 PCP: Katrinka Aquas, MD   Brief Narrative: [name] is a 68 y.o. female with a history of chronic pain, COPD, depression, diverticulosis, GERD, right eye blindness, PTSD, chronic respiratory failure on oxygen  .  Patient presented secondary to loss of consciousness of unclear etiology. EEG revealed no evidence of seizure activity. Presentation initially concerning for code STEMI, which was canceled after cardiology evaluated. Patient's presentation was also consistent with SIRS without source; empiric antibiotics started and cultures obtained. Patient's mental status improved to baseline.   Assessment and Plan:  SIRS No source identified at this time.  Patient met criteria with leukocytosis, tachypnea, hypotension, lactic acid of 3.3.  It is possible that she has a noninfectious etiology for her tachypnea and leukocytosis.  Patient started empirically on vancomycin  and meropenem in the ED.  Blood cultures obtained before antibiotics.  Urinalysis obtained after antibiotics. - Continue Vancomycin  and Zosyn pending blood culture data - Follow-up blood and urine cultures   Altered mental status Most likely acute toxic-metabolic encephalopathy.  Possibly related to underlying infection, however could be related to home medications. Patient's UDS is positive for benzodiazepine and opiates, which is expected based on home medication regimen.  EEG obtained in the ED for concern of possible seizure activity evidence of seizures.  CT scan of the head unremarkable for acute etiology.  Mental status appears to be back to baseline at this time. Patient denied suicidal ideation/intent.   Acute on chronic respiratory failure with hypoxia Patient is on 3 L/min via nasal canula at baseline but only at night. Patient requiring up to 5 L/min with tachypnea and SpO2 down to 88% during ED evaluation. On my seeing the patient,  her SpO2 was down to 82% on 8 L/min with lethargy. Unclear etiology for worsening hypoxia but it is possible this could still be medication related. CTA chest negative for etiology. Patient weaned to room air. - Wean oxygen  to 3 L/min (at night) - Ambulatory pulse ox - PT/OT evaluation pending   COPD No wheezing or change in productive cough. Presentation does not appear consistent with an acute exacerbation. - Continue Breztri  and albuterol    Abnormal EKG Patient with inferior ST elevations initially concerning for STEMI. Cardiology consulted and canceled code STEMI after review of patient's EKG, which is similar to previous and likely related to early repolarization. Troponin mildly elevated at 32 with negative delta of 25. Bedside ultrasound performed and significant for an LVEF of 50-55% with mildly enlarged RV. - Ongoing cardiology recommendations  Elevated d-dimer With associated hypoxia, ordered CTA chest, which was negative for acute PE.   Hypotension Possibly related to SIRS/infection.  Resolved with IV fluids.  Patient did not need vasopressor support.   AKI Patient's baseline creatinine appears to be around 0.71.  Creatinine of 1.22 on day of admission.  Patient given IV fluids during resuscitation for concern of sepsis on admission. AKI resolved with IV fluids.   Elevated AST/ALT Mild. Unclear etiology at this time.  No abdominal pain.  Patient is not an alcohol  drinker.  Patient reports some viral-like illness, which could be contributory to liver enzyme elevation. Improved today. - Trend with CMP - Will defer further workup since numbers are improving   Polycythemia Likely related to dehydration.  Hemoglobin of 17.5 g/dL on admission. Resolved after IV fluids.   Chronic pain Patient with history of ankle surgeries requiring hardware which were recently removed per patient.  Patient is on oxycodone  and gabapentin  for management. - Continue oxycodone  as needed - Hold  gabapentin   Anxiety -Continue Klonopin  as needed   Tobacco use -Continue nicotine  patch   DVT prophylaxis: Lovenox  Code Status:   Code Status: Full Code Family Communication: Husband at bedside Disposition Plan: Discharge likely home pending PT/OT recommendations, specialist recommendations and negative blood culture results   Consultants:  Cardiology  Procedures:  None  Antimicrobials: Vancomycin  Meropenem Zosyn    Subjective: Patient reports feeling great and back to baseline. Hoping to discharge today. States there was an issue when she went for her CTA chest with possible leaking of fluid.  Objective: BP (!) 154/80 (BP Location: Left Arm)   Pulse 82   Temp 97.6 F (36.4 C) (Oral)   Resp 13   Wt 43.7 kg   SpO2 99%   BMI 18.82 kg/m   Examination:  General exam: Appears calm and comfortable. Respiratory system: Diminished but clear to auscultation. Respiratory effort normal. Cardiovascular system: S1 & S2 heard, RRR. No murmur. Gastrointestinal system: Abdomen is nondistended, soft and nontender. Normal bowel sounds heard. Central nervous system: Alert and oriented. No focal neurological deficits. Musculoskeletal: No edema. No calf tenderness Psychiatry: Judgement and insight appear normal. Mood & affect appropriate.   Data Reviewed: I have personally reviewed following labs and imaging studies  CBC Lab Results  Component Value Date   WBC 13.6 (H) 11/13/2024   RBC 4.74 11/13/2024   HGB 14.9 11/13/2024   HCT 45.6 11/13/2024   MCV 96.2 11/13/2024   MCH 31.4 11/13/2024   PLT 294 11/13/2024   MCHC 32.7 11/13/2024   RDW 13.9 11/13/2024   LYMPHSABS 1.1 11/12/2024   MONOABS 0.9 11/12/2024   EOSABS 0.0 11/12/2024   BASOSABS 0.0 11/12/2024     Last metabolic panel Lab Results  Component Value Date   NA 139 11/13/2024   K 3.7 11/13/2024   CL 99 11/13/2024   CO2 30 11/13/2024   BUN 16 11/13/2024   CREATININE 0.77 11/13/2024   GLUCOSE 87  11/13/2024   GFRNONAA >60 11/13/2024   GFRAA >60 07/08/2019   CALCIUM 8.1 (L) 11/13/2024   PROT 5.6 (L) 11/13/2024   ALBUMIN 2.8 (L) 11/13/2024   BILITOT 0.6 11/13/2024   ALKPHOS 108 11/13/2024   AST 59 (H) 11/13/2024   ALT 50 (H) 11/13/2024   ANIONGAP 10 11/13/2024    GFR: Estimated Creatinine Clearance: 47.1 mL/min (by C-G formula based on SCr of 0.77 mg/dL).  Recent Results (from the past 240 hours)  Resp panel by RT-PCR (RSV, Flu A&B, Covid) Anterior Nasal Swab     Status: None   Collection Time: 11/12/24  1:44 PM   Specimen: Anterior Nasal Swab  Result Value Ref Range Status   SARS Coronavirus 2 by RT PCR NEGATIVE NEGATIVE Final   Influenza A by PCR NEGATIVE NEGATIVE Final   Influenza B by PCR NEGATIVE NEGATIVE Final    Comment: (NOTE) The Xpert Xpress SARS-CoV-2/FLU/RSV plus assay is intended as an aid in the diagnosis of influenza from Nasopharyngeal swab specimens and should not be used as a sole basis for treatment. Nasal washings and aspirates are unacceptable for Xpert Xpress SARS-CoV-2/FLU/RSV testing.  Fact Sheet for Patients: bloggercourse.com  Fact Sheet for Healthcare Providers: seriousbroker.it  This test is not yet approved or cleared by the [place]  FDA and has been authorized for detection and/or diagnosis of SARS-CoV-2 by FDA under an Emergency Use Authorization (EUA). This EUA will remain in effect (meaning this  test can be used) for the duration of the COVID-19 declaration under Section 564(b)(1) of the Act, 21 U.S.C. section 360bbb-3(b)(1), unless the authorization is terminated or revoked.     Resp Syncytial Virus by PCR NEGATIVE NEGATIVE Final    Comment: (NOTE) Fact Sheet for Patients: bloggercourse.com  Fact Sheet for Healthcare Providers: seriousbroker.it  This test is not yet approved or cleared by the [place]  FDA and has  been authorized for detection and/or diagnosis of SARS-CoV-2 by FDA under an Emergency Use Authorization (EUA). This EUA will remain in effect (meaning this test can be used) for the duration of the COVID-19 declaration under Section 564(b)(1) of the Act, 21 U.S.C. section 360bbb-3(b)(1), unless the authorization is terminated or revoked.  Performed at [facility], [address] [place], [place] [address]       Radiology Studies: DG CHEST PORT 1 VIEW Result Date: 11/12/2024 CLINICAL DATA:  Hypoxia. EXAM: PORTABLE CHEST 1 VIEW COMPARISON:  November 12, 2024 FINDINGS: The heart size and mediastinal contours are within normal limits. The lungs are hyperinflated. Mild linear atelectasis is seen within the retrocardiac region of the left lung base. No focal consolidation, pleural effusion or pneumothorax is identified. The visualized skeletal structures are unremarkable. IMPRESSION: Mild left basilar linear atelectasis. Electronically Signed   By: Suzen Dials M.D.   On: 11/12/2024 18:23   EEG adult Result Date: 11/12/2024 Gregg Lek, MD     11/12/2024  2:56 PM Patient Name: [name] MRN: [id number] Epilepsy Attending: Lek Gregg Referring Physician/Provider: No ref. provider found     Date: 11/12/2024 Duration: 25 minutes Patient history: 57 with altered mental status, EEG to evaluate for seizure Level of alertness: Awake, drowsy AEDs during EEG study: None Technical aspects: This EEG study was done with scalp electrodes positioned according to the 10-20 International system of electrode placement. Electrical activity was reviewed with band pass filter of 1-70Hz , sensitivity of 7 uV/mm, display speed of 91mm/sec with a 60Hz  notched filter applied as appropriate. EEG data were recorded continuously and digitally stored.  Video monitoring was available and reviewed as appropriate. Description: . EEG showed continuous generalized polymorphic sharply contoured 4 -6 Hz theta  slowing. Sleep was not seen. Hyperventilation and photic stimulation were not performed.   ABNORMALITY - Continuous slow, generalized IMPRESSION: This study is suggestive of mild diffuse brain dysfunction such as encephalopathy, nonspecific etiology but likely related to sedation, toxic-metabolic etiology. No seizures or epileptiform discharges were seen throughout the recording. Lek Gregg MD Neurology    CT CERVICAL SPINE WO CONTRAST Result Date: 11/12/2024 EXAM: CT CERVICAL SPINE WITHOUT CONTRAST 11/12/2024 01:42:26 PM TECHNIQUE: CT of the cervical spine was performed without the administration of intravenous contrast. Multiplanar reformatted images are provided for review. Automated exposure control, iterative reconstruction, and/or weight based adjustment of the mA/kV was utilized to reduce the radiation dose to as low as reasonably achievable. COMPARISON: Prior CT dated 05/18/2022. CLINICAL HISTORY: FINDINGS: CERVICAL SPINE: BONES AND ALIGNMENT: Reversal of the normal cervical lordosis which is likely at least partially positional in etiology. Trace anterolisthesis of C3 on C4. There is 3.5 mm anterolisthesis of C4 on C5 which appears increased from the prior CT. No facet subluxation or dislocation. No acute fracture. DEGENERATIVE CHANGES: Moderate disc space narrowing at C5-C6 and C6-C7 similar to prior. Degenerative endplate osteophytes in the lower cervical spine. Facet arthrosis and uncovertebral hypertrophy at multiple levels. There is no high grade osseous spinal canal stenosis. SOFT TISSUES: No prevertebral soft tissue  swelling. LUNGS: Centrilobular emphysema in the upper lobes. IMPRESSION: 1. 3.5 mm anterolisthesis of C4 on C5, increased from prior CT at which time it measured approximately 2mm. Recommend correlation with tenderness at this level and consider MRI to evaluate for ligamentous injury. 2. No acute fracture. Electronically signed by: Donnice Mania MD 11/12/2024 02:04 PM EST RP  Workstation: [id number]   CT HEAD WO CONTRAST Result Date: 11/12/2024 EXAM: CT HEAD WITHOUT 11/12/2024 01:42:26 PM TECHNIQUE: CT of the head was performed without the administration of intravenous contrast. Automated exposure control, iterative reconstruction, and/or weight based adjustment of the mA/kV was utilized to reduce the radiation dose to as low as reasonably achievable. COMPARISON: 05/18/2022 CLINICAL HISTORY: headache syncope FINDINGS: BRAIN AND VENTRICLES: No acute intracranial hemorrhage. No mass effect or midline shift. No extra-axial fluid collection. No evidence of acute infarct. No hydrocephalus. Mild atherosclerosis of the carotid siphons. ORBITS: No acute abnormality. SINUSES AND MASTOIDS: There is mild mucosal thickening in the partially visualized ethmoid and maxillary sinuses. SOFT TISSUES AND SKULL: No acute skull fracture. No acute soft tissue abnormality. IMPRESSION: 1. No acute intracranial abnormality. 2. Mild mucosal thickening in the partially visualized ethmoid and maxillary sinuses. Electronically signed by: Donnice Mania MD 11/12/2024 01:57 PM EST RP Workstation: [id number]   DG Chest Port 1 View Result Date: 11/12/2024 CLINICAL DATA:  Syncope. EXAM: PORTABLE CHEST 1 VIEW COMPARISON:  04/24/2022 and older exams.  CT, 08/12/2023. FINDINGS: Cardiac silhouette is normal in size. No mediastinal or hilar masses. Lungs are hyperexpanded. Prominent interstitial markings, chronic. Mild reticulonodular opacities in the left lateral lung base consistent with atelectasis/scarring. No evidence of pneumonia or pulmonary edema. No pleural effusion or pneumothorax. Skeletal structures are grossly intact. IMPRESSION: No active disease. Electronically Signed   By: Alm Parkins M.D.   On: 11/12/2024 13:40      LOS: 1 day    Elgin Lam, MD [facility] 11/13/2024, 7:24 AM   If 7PM-7AM, please contact night-coverage [email or web address]

## 2024-11-13 NOTE — Hospital Course (Signed)
 [name] is a 68 y.o. female with a history of chronic pain, COPD, depression, diverticulosis, GERD, right eye blindness, PTSD, chronic respiratory failure on oxygen  .  Patient presented secondary to loss of consciousness of unclear etiology. EEG revealed no evidence of seizure activity. Presentation initially concerning for code STEMI, which was canceled after cardiology evaluated. Patient's presentation was also consistent with SIRS without source; empiric antibiotics started and cultures obtained. Patient's mental status improved to baseline. 11/30: Patient is afebrile.  Cultures have been negative.  Patient appears to be at baseline.  Encephalopathy resolved.  Likely cause of her presentation could be related to polypharmacy.  Patient will be discharged home on home medications.

## 2024-11-13 NOTE — Progress Notes (Signed)
 DAILY PROGRESS NOTE   Patient Name: [name] Date of Encounter: 11/13/2024 Cardiologist: Oneil Parchment, MD  Chief Complaint   No chest pain  Patient Profile   [name] is a 68 y.o. female with a hx of tobacco use, COPD, IBS, GERD who is being seen 11/12/2024 for the evaluation of abnormal EKG at the request of Dr. Yolande.   Subjective   BP improved today- bedside echo showed LVEF 50-55% per Dr. Parchment with RV enlargement thought to be chronic. Low normal LV function may be secondary to infection.   Objective   Vitals:   11/13/24 0010 11/13/24 0318 11/13/24 0734 11/13/24 0739  BP: 113/62 (!) 154/80 (!) 141/81   Pulse: 85 82 79   Resp: 14 13 16    Temp: 98.1 F (36.7 C) 97.6 F (36.4 C) 97.8 F (36.6 C)   TempSrc: Oral Oral Oral   SpO2: 93% 99% 98% 98%  Weight:        Intake/Output Summary (Last 24 hours) at 11/13/2024 1039 Last data filed at 11/13/2024 9641 Gross per 24 hour  Intake 703.19 ml  Output 400 ml  Net 303.19 ml   Filed Weights   11/12/24 1800  Weight: 43.7 kg    Physical Exam   General appearance: alert and no distress Lungs: clear to auscultation bilaterally Heart: regular rate and rhythm Extremities: extremities normal, atraumatic, no cyanosis or edema and venous stasis dermatitis noted Neurologic: Grossly normal  Inpatient Medications    Scheduled Meds:  budesonide -glycopyrrolate -formoterol   2 puff Inhalation BID   enoxaparin  (LOVENOX ) injection  30 mg Subcutaneous Q24H   loperamide   4 mg Oral Daily   nicotine   21 mg Transdermal Daily   vancomycin  variable dose per unstable renal function (pharmacist dosing)   Does not apply See admin instructions    Continuous Infusions:  sodium chloride  Stopped (11/13/24 0324)   piperacillin-tazobactam Stopped (11/13/24 0724)    PRN Meds: acetaminophen  **OR** acetaminophen , albuterol , artificial tears, clonazePAM , loperamide  **AND** loperamide , ondansetron  **OR** ondansetron   (ZOFRAN ) IV, oxyCODONE -acetaminophen  **AND** oxyCODONE    Labs   Results for orders placed or performed during the hospital encounter of 11/12/24 (from the past 48 hours)  CBG monitoring, ED     Status: Abnormal   Collection Time: 11/12/24 12:40 PM  Result Value Ref Range   Glucose-Capillary 112 (H) 70 - 99 mg/dL    Comment: Glucose reference range applies only to samples taken after fasting for at least 8 hours.  Comprehensive metabolic panel     Status: Abnormal   Collection Time: 11/12/24 12:50 PM  Result Value Ref Range   Sodium 142 135 - 145 mmol/L   Potassium 4.1 3.5 - 5.1 mmol/L   Chloride 100 98 - 111 mmol/L   CO2 26 22 - 32 mmol/L   Glucose, Bld 110 (H) 70 - 99 mg/dL    Comment: Glucose reference range applies only to samples taken after fasting for at least 8 hours.   BUN 18 8 - 23 mg/dL   Creatinine, Ser 8.77 (H) 0.44 - 1.00 mg/dL   Calcium 8.8 (L) 8.9 - 10.3 mg/dL   Total Protein 7.2 6.5 - 8.1 g/dL   Albumin 3.6 3.5 - 5.0 g/dL   AST 898 (H) 15 - 41 U/L   ALT 68 (H) 0 - 44 U/L   Alkaline Phosphatase 133 (H) 38 - 126 U/L   Total Bilirubin 0.6 0.0 - 1.2 mg/dL   GFR, Estimated 49 (L) >60 mL/min  Comment: (NOTE) Calculated using the CKD-EPI Creatinine Equation (2021)    Anion gap 16 (H) 5 - 15    Comment: Performed at [facility], [address], [place], [place] [address]  CBC WITH DIFFERENTIAL     Status: Abnormal   Collection Time: 11/12/24 12:50 PM  Result Value Ref Range   WBC 15.1 (H) 4.0 - 10.5 K/uL   RBC 5.47 (H) 3.87 - 5.11 MIL/uL   Hemoglobin 17.5 (H) 12.0 - 15.0 g/dL   HCT 44.0 (H) 63.9 - 53.9 %   MCV 102.2 (H) 80.0 - 100.0 fL   MCH 32.0 26.0 - 34.0 pg   MCHC 31.3 30.0 - 36.0 g/dL   RDW 85.7 88.4 - 84.4 %   Platelets 284 150 - 400 K/uL   nRBC 0.0 0.0 - 0.2 %   Neutrophils Relative % 85 %   Neutro Abs 12.9 (H) 1.7 - 7.7 K/uL   Lymphocytes Relative 8 %   Lymphs Abs 1.1 0.7 - 4.0 K/uL   Monocytes Relative 6 %   Monocytes Absolute 0.9 0.1 -  1.0 K/uL   Eosinophils Relative 0 %   Eosinophils Absolute 0.0 0.0 - 0.5 K/uL   Basophils Relative 0 %   Basophils Absolute 0.0 0.0 - 0.1 K/uL   Immature Granulocytes 1 %   Abs Immature Granulocytes 0.08 (H) 0.00 - 0.07 K/uL    Comment: Performed at [facility], [address], [place], [place] [address]  Ethanol     Status: None   Collection Time: 11/12/24 12:50 PM  Result Value Ref Range   Alcohol , Ethyl (B) <15 <15 mg/dL    Comment: (NOTE) For medical purposes only. Performed at [facility], [address] [place], [place] [address]   Magnesium     Status: None   Collection Time: 11/12/24 12:50 PM  Result Value Ref Range   Magnesium 2.3 1.7 - 2.4 mg/dL    Comment: Performed at [facility], [address] [place], [place] [address]  Troponin I (High Sensitivity)     Status: Abnormal   Collection Time: 11/12/24 12:50 PM  Result Value Ref Range   Troponin I (High Sensitivity) 32 (H) <18 ng/L    Comment: (NOTE) Elevated high sensitivity troponin I (hsTnI) values and significant  changes across serial measurements may suggest ACS but many other  chronic and acute conditions are known to elevate hsTnI results.  Refer to the Links section for chest pain algorithms and additional  guidance. Performed at [facility], [address] [place], [place] [address]   I-Stat Chem 8, ED     Status: Abnormal   Collection Time: 11/12/24 12:55 PM  Result Value Ref Range   Sodium 141 135 - 145 mmol/L   Potassium 3.8 3.5 - 5.1 mmol/L   Chloride 99 98 - 111 mmol/L   BUN 21 8 - 23 mg/dL   Creatinine, Ser 8.79 (H) 0.44 - 1.00 mg/dL   Glucose, Bld 891 (H) 70 - 99 mg/dL    Comment: Glucose reference range applies only to samples taken after fasting for at least 8 hours.   Calcium, Ion 1.00 (L) 1.15 - 1.40 mmol/L   TCO2 28 22 - 32 mmol/L   Hemoglobin 19.0 (H) 12.0 - 15.0 g/dL   HCT 43.9 (H) 63.9 - 53.9 %  I-Stat CG4 Lactic Acid     Status: Abnormal    Collection Time: 11/12/24 12:55 PM  Result Value  Ref Range   Lactic Acid, Venous 3.3 (HH) 0.5 - 1.9 mmol/L   Comment NOTIFIED PHYSICIAN   I-Stat venous blood gas, ([facility] ED, [facility], [facility])     Status: Abnormal   Collection Time: 11/12/24 12:55 PM  Result Value Ref Range   pH, Ven 7.356 7.25 - 7.43   pCO2, Ven 57.1 44 - 60 mmHg   pO2, Ven 21 (LL) 32 - 45 mmHg   Bicarbonate 31.9 (H) 20.0 - 28.0 mmol/L   TCO2 34 (H) 22 - 32 mmol/L   O2 Saturation 30 %   Acid-Base Excess 4.0 (H) 0.0 - 2.0 mmol/L   Sodium 139 135 - 145 mmol/L   Potassium 3.8 3.5 - 5.1 mmol/L   Calcium, Ion 0.98 (L) 1.15 - 1.40 mmol/L   HCT 56.0 (H) 36.0 - 46.0 %   Hemoglobin 19.0 (H) 12.0 - 15.0 g/dL   Sample type VENOUS    Comment NOTIFIED PHYSICIAN   CBG monitoring, ED     Status: Abnormal   Collection Time: 11/12/24  1:42 PM  Result Value Ref Range   Glucose-Capillary 105 (H) 70 - 99 mg/dL    Comment: Glucose reference range applies only to samples taken after fasting for at least 8 hours.  Resp panel by RT-PCR (RSV, Flu A&B, Covid) Anterior Nasal Swab     Status: None   Collection Time: 11/12/24  1:44 PM   Specimen: Anterior Nasal Swab  Result Value Ref Range   SARS Coronavirus 2 by RT PCR NEGATIVE NEGATIVE   Influenza A by PCR NEGATIVE NEGATIVE   Influenza B by PCR NEGATIVE NEGATIVE    Comment: (NOTE) The Xpert Xpress SARS-CoV-2/FLU/RSV plus assay is intended as an aid in the diagnosis of influenza from Nasopharyngeal swab specimens and should not be used as a sole basis for treatment. Nasal washings and aspirates are unacceptable for Xpert Xpress SARS-CoV-2/FLU/RSV testing.  Fact Sheet for Patients: bloggercourse.com  Fact Sheet for Healthcare Providers: seriousbroker.it  This test is not yet approved or cleared by the [place]  FDA and has been authorized for detection and/or diagnosis of SARS-CoV-2 by FDA under an Emergency Use Authorization (EUA). This  EUA will remain in effect (meaning this test can be used) for the duration of the COVID-19 declaration under Section 564(b)(1) of the Act, 21 U.S.C. section 360bbb-3(b)(1), unless the authorization is terminated or revoked.     Resp Syncytial Virus by PCR NEGATIVE NEGATIVE    Comment: (NOTE) Fact Sheet for Patients: bloggercourse.com  Fact Sheet for Healthcare Providers: seriousbroker.it  This test is not yet approved or cleared by the [place]  FDA and has been authorized for detection and/or diagnosis of SARS-CoV-2 by FDA under an Emergency Use Authorization (EUA). This EUA will remain in effect (meaning this test can be used) for the duration of the COVID-19 declaration under Section 564(b)(1) of the Act, 21 U.S.C. section 360bbb-3(b)(1), unless the authorization is terminated or revoked.  Performed at [facility], [address] [place], [place] [address]   Blood Culture (routine x 2)     Status: None (Preliminary result)   Collection Time: 11/12/24  2:10 PM   Specimen: BLOOD LEFT HAND  Result Value Ref Range   Specimen Description BLOOD LEFT HAND    Special Requests      BOTTLES DRAWN AEROBIC AND ANAEROBIC Blood Culture adequate volume   Culture      NO GROWTH < 24 HOURS Performed at [facility], [address], [place],  [place] [address]    Report Status PENDING   Blood Culture (routine x 2)     Status: None (Preliminary result)   Collection Time: 11/12/24  2:16 PM   Specimen: BLOOD LEFT WRIST  Result Value Ref Range   Specimen Description BLOOD LEFT WRIST    Special Requests      BOTTLES DRAWN AEROBIC AND ANAEROBIC Blood Culture results may not be optimal due to an inadequate volume of blood received in culture bottles   Culture      NO GROWTH < 24 HOURS Performed at [facility], [address] [place], [place] [address]    Report Status PENDING   Troponin I (High Sensitivity)      Status: Abnormal   Collection Time: 11/12/24  2:48 PM  Result Value Ref Range   Troponin I (High Sensitivity) 25 (H) <18 ng/L    Comment: (NOTE) Elevated high sensitivity troponin I (hsTnI) values and significant  changes across serial measurements may suggest ACS but many other  chronic and acute conditions are known to elevate hsTnI results.  Refer to the Links section for chest pain algorithms and additional  guidance. Performed at [facility], [address] [place], [place] [address]   CK     Status: None   Collection Time: 11/12/24  2:48 PM  Result Value Ref Range   Total CK 141 38 - 234 U/L    Comment: Performed at [facility], [address], [place], [place] [address]  I-Stat CG4 Lactic Acid     Status: None   Collection Time: 11/12/24  3:33 PM  Result Value Ref Range   Lactic Acid, Venous 1.9 0.5 - 1.9 mmol/L  Rapid urine drug screen (hospital performed)     Status: Abnormal   Collection Time: 11/12/24  4:53 PM  Result Value Ref Range   Opiates POSITIVE (A) NONE DETECTED   Cocaine NONE DETECTED NONE DETECTED   Benzodiazepines POSITIVE (A) NONE DETECTED   Amphetamines NONE DETECTED NONE DETECTED   Tetrahydrocannabinol NONE DETECTED NONE DETECTED   Barbiturates NONE DETECTED NONE DETECTED    Comment: (NOTE) DRUG SCREEN FOR MEDICAL PURPOSES ONLY.  IF CONFIRMATION IS NEEDED FOR ANY PURPOSE, NOTIFY LAB WITHIN 5 DAYS.  LOWEST DETECTABLE LIMITS FOR URINE DRUG SCREEN Drug Class                     Cutoff (ng/mL) Amphetamine and metabolites    1000 Barbiturate and metabolites    200 Benzodiazepine                 200 Opiates and metabolites        300 Cocaine and metabolites        300 THC                            50 Performed at [facility] Lab, [address], [place], [place] [address]   Urinalysis, Routine w reflex microscopic -Urine, Clean Catch     Status: Abnormal   Collection Time: 11/12/24  4:53 PM  Result Value Ref Range   Color,  Urine YELLOW YELLOW   APPearance CLEAR CLEAR   Specific Gravity, Urine 1.025 1.005 - 1.030   pH 5.0 5.0 - 8.0   Glucose, UA NEGATIVE NEGATIVE mg/dL   Hgb urine dipstick NEGATIVE NEGATIVE   Bilirubin Urine NEGATIVE NEGATIVE   Ketones, ur 5 (A) NEGATIVE mg/dL  Protein, ur NEGATIVE NEGATIVE mg/dL   Nitrite NEGATIVE NEGATIVE   Leukocytes,Ua NEGATIVE NEGATIVE    Comment: Performed at [facility], [address] [place], [place] [address]  D-dimer, quantitative     Status: Abnormal   Collection Time: 11/12/24 11:44 PM  Result Value Ref Range   D-Dimer, Quant 3.12 (H) 0.00 - 0.50 ug/mL-FEU    Comment: (NOTE) At the manufacturer cut-off value of 0.5 g/mL FEU, this assay has a negative predictive value of 95-100%.This assay is intended for use in conjunction with a clinical pretest probability (PTP) assessment model to exclude pulmonary embolism (PE) and deep venous thrombosis (DVT) in outpatients suspected of PE or DVT. Results should be correlated with clinical presentation. Performed at [facility], [address] [place], [place] [address]   HIV Antibody (routine testing w rflx)     Status: None   Collection Time: 11/12/24 11:44 PM  Result Value Ref Range   HIV Screen 4th Generation wRfx Non Reactive Non Reactive    Comment: Performed at [facility], [address], [place], [place] [address]  Comprehensive metabolic panel     Status: Abnormal   Collection Time: 11/13/24  3:15 AM  Result Value Ref Range   Sodium 139 135 - 145 mmol/L   Potassium 3.7 3.5 - 5.1 mmol/L   Chloride 99 98 - 111 mmol/L   CO2 30 22 - 32 mmol/L   Glucose, Bld 87 70 - 99 mg/dL    Comment: Glucose reference range applies only to samples taken after fasting for at least 8 hours.   BUN 16 8 - 23 mg/dL   Creatinine, Ser 9.22 0.44 - 1.00 mg/dL   Calcium 8.1 (L) 8.9 - 10.3 mg/dL   Total Protein 5.6 (L) 6.5 - 8.1 g/dL   Albumin 2.8 (L) 3.5 - 5.0 g/dL   AST 59 (H) 15 - 41 U/L   ALT 50  (H) 0 - 44 U/L   Alkaline Phosphatase 108 38 - 126 U/L   Total Bilirubin 0.6 0.0 - 1.2 mg/dL   GFR, Estimated >39 >39 mL/min    Comment: (NOTE) Calculated using the CKD-EPI Creatinine Equation (2021)    Anion gap 10 5 - 15    Comment: Performed at [facility] Lab, [address], [place], [place] [address]  CBC     Status: Abnormal   Collection Time: 11/13/24  3:15 AM  Result Value Ref Range   WBC 13.6 (H) 4.0 - 10.5 K/uL   RBC 4.74 3.87 - 5.11 MIL/uL   Hemoglobin 14.9 12.0 - 15.0 g/dL   HCT 54.3 63.9 - 53.9 %   MCV 96.2 80.0 - 100.0 fL   MCH 31.4 26.0 - 34.0 pg   MCHC 32.7 30.0 - 36.0 g/dL   RDW 86.0 88.4 - 84.4 %   Platelets 294 150 - 400 K/uL   nRBC 0.0 0.0 - 0.2 %    Comment: Performed at [facility], [address], [place], [place] [address]    ECG   N/A  Telemetry   Sinus rhythm - Personally Reviewed  Radiology    CT Angio Chest Pulmonary Embolism (PE) W or WO Contrast Result Date: 11/13/2024 EXAM: CTA CHEST 11/13/2024 07:09:41 AM TECHNIQUE: CTA of the chest was performed without and with the administration of 75 mL of iohexol  (OMNIPAQUE ) 350 MG/ML injection. Multiplanar reformatted images are provided for review. MIP images are provided for review. Automated exposure control, iterative reconstruction,  and/or weight based adjustment of the mA/kV was utilized to reduce the radiation dose to as low as reasonably achievable. COMPARISON: CT of the chest dated 08/12/2023. CLINICAL HISTORY: Pulmonary embolism (PE) suspected, low to intermediate prob, positive D-dimer. FINDINGS: PULMONARY ARTERIES: Pulmonary arteries are adequately opacified for evaluation. No acute pulmonary embolus. Main pulmonary artery is normal in caliber. MEDIASTINUM: The heart and pericardium demonstrate no acute abnormality. There is mild calcific plaque within the aortic arch. LYMPH NODES: No mediastinal, hilar or axillary lymphadenopathy. LUNGS AND PLEURA: There is moderate central alveolar  emphysema present. There is mild atelectasis present dependently within the lower lobes bilaterally. No focal consolidation or pulmonary edema. No evidence of pleural effusion or pneumothorax. UPPER ABDOMEN: Limited images of the upper abdomen are unremarkable. SOFT TISSUES AND BONES: No acute bone or soft tissue abnormality. IMPRESSION: 1. No evidence of pulmonary embolus. 2. Moderate central alveolar emphysema. Given that emphysema is an independent risk factor for lung cancer, consider evaluation for eligibility for a low-dose CT lung cancer screening program if the patient is between 1 and 46 years old. 3. Mild atelectasis dependently within the lower lobes bilaterally. 4. Mild calcific plaque within the aortic arch. Electronically signed by: Evalene Coho MD 11/13/2024 07:31 AM EST RP Workstation: [id number]   DG CHEST PORT 1 VIEW Result Date: 11/12/2024 CLINICAL DATA:  Hypoxia. EXAM: PORTABLE CHEST 1 VIEW COMPARISON:  November 12, 2024 FINDINGS: The heart size and mediastinal contours are within normal limits. The lungs are hyperinflated. Mild linear atelectasis is seen within the retrocardiac region of the left lung base. No focal consolidation, pleural effusion or pneumothorax is identified. The visualized skeletal structures are unremarkable. IMPRESSION: Mild left basilar linear atelectasis. Electronically Signed   By: Suzen Dials M.D.   On: 11/12/2024 18:23   EEG adult Result Date: 11/12/2024 Gregg Lek, MD     11/12/2024  2:56 PM Patient Name: CLARENCE DUNSMORE MRN: [id number] Epilepsy Attending: Lek Gregg Referring Physician/Provider: No ref. provider found     Date: 11/12/2024 Duration: 25 minutes Patient history: 39 with altered mental status, EEG to evaluate for seizure Level of alertness: Awake, drowsy AEDs during EEG study: None Technical aspects: This EEG study was done with scalp electrodes positioned according to the 10-20 International system of electrode placement.  Electrical activity was reviewed with band pass filter of 1-70Hz , sensitivity of 7 uV/mm, display speed of 9mm/sec with a 60Hz  notched filter applied as appropriate. EEG data were recorded continuously and digitally stored.  Video monitoring was available and reviewed as appropriate. Description: . EEG showed continuous generalized polymorphic sharply contoured 4 -6 Hz theta slowing. Sleep was not seen. Hyperventilation and photic stimulation were not performed.   ABNORMALITY - Continuous slow, generalized IMPRESSION: This study is suggestive of mild diffuse brain dysfunction such as encephalopathy, nonspecific etiology but likely related to sedation, toxic-metabolic etiology. No seizures or epileptiform discharges were seen throughout the recording. Lek Gregg MD Neurology    CT CERVICAL SPINE WO CONTRAST Result Date: 11/12/2024 EXAM: CT CERVICAL SPINE WITHOUT CONTRAST 11/12/2024 01:42:26 PM TECHNIQUE: CT of the cervical spine was performed without the administration of intravenous contrast. Multiplanar reformatted images are provided for review. Automated exposure control, iterative reconstruction, and/or weight based adjustment of the mA/kV was utilized to reduce the radiation dose to as low as reasonably achievable. COMPARISON: Prior CT dated 05/18/2022. CLINICAL HISTORY: FINDINGS: CERVICAL SPINE: BONES AND ALIGNMENT: Reversal of the normal cervical lordosis which is likely at least partially positional in etiology. Trace anterolisthesis of  C3 on C4. There is 3.5 mm anterolisthesis of C4 on C5 which appears increased from the prior CT. No facet subluxation or dislocation. No acute fracture. DEGENERATIVE CHANGES: Moderate disc space narrowing at C5-C6 and C6-C7 similar to prior. Degenerative endplate osteophytes in the lower cervical spine. Facet arthrosis and uncovertebral hypertrophy at multiple levels. There is no high grade osseous spinal canal stenosis. SOFT TISSUES: No prevertebral soft tissue  swelling. LUNGS: Centrilobular emphysema in the upper lobes. IMPRESSION: 1. 3.5 mm anterolisthesis of C4 on C5, increased from prior CT at which time it measured approximately 2mm. Recommend correlation with tenderness at this level and consider MRI to evaluate for ligamentous injury. 2. No acute fracture. Electronically signed by: Donnice Mania MD 11/12/2024 02:04 PM EST RP Workstation: [id number]   CT HEAD WO CONTRAST Result Date: 11/12/2024 EXAM: CT HEAD WITHOUT 11/12/2024 01:42:26 PM TECHNIQUE: CT of the head was performed without the administration of intravenous contrast. Automated exposure control, iterative reconstruction, and/or weight based adjustment of the mA/kV was utilized to reduce the radiation dose to as low as reasonably achievable. COMPARISON: 05/18/2022 CLINICAL HISTORY: headache syncope FINDINGS: BRAIN AND VENTRICLES: No acute intracranial hemorrhage. No mass effect or midline shift. No extra-axial fluid collection. No evidence of acute infarct. No hydrocephalus. Mild atherosclerosis of the carotid siphons. ORBITS: No acute abnormality. SINUSES AND MASTOIDS: There is mild mucosal thickening in the partially visualized ethmoid and maxillary sinuses. SOFT TISSUES AND SKULL: No acute skull fracture. No acute soft tissue abnormality. IMPRESSION: 1. No acute intracranial abnormality. 2. Mild mucosal thickening in the partially visualized ethmoid and maxillary sinuses. Electronically signed by: Donnice Mania MD 11/12/2024 01:57 PM EST RP Workstation: [id number]   DG Chest Port 1 View Result Date: 11/12/2024 CLINICAL DATA:  Syncope. EXAM: PORTABLE CHEST 1 VIEW COMPARISON:  04/24/2022 and older exams.  CT, 08/12/2023. FINDINGS: Cardiac silhouette is normal in size. No mediastinal or hilar masses. Lungs are hyperexpanded. Prominent interstitial markings, chronic. Mild reticulonodular opacities in the left lateral lung base consistent with atelectasis/scarring. No evidence of pneumonia or pulmonary  edema. No pleural effusion or pneumothorax. Skeletal structures are grossly intact. IMPRESSION: No active disease. Electronically Signed   By: Alm Parkins M.D.   On: 11/12/2024 13:40    Cardiac Studies   N/A  Assessment   Principal Problem:   SIRS (systemic inflammatory response syndrome) (HCC) Active Problems:   Septic shock (HCC)   Abnormal ECG   Plan   More alert than described previously today. Denies chest pain. BP has recovered. SIRS/sepsis suspected. Appears euvolemic. LVEF 50-55% by bedside echo - suspect may be decreased due to infection. 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 Hold on SGLT2 given presumed infection. Hold atorvastatin d/t elevated liver enzymes and consider resumption when they have normalized.    Time Spent Directly with Patient:  I have spent a total of 35 minutes with the patient reviewing hospital notes, telemetry, EKGs, labs and examining the patient as well as establishing an assessment and plan that was discussed personally with the patient.  > 50% of time was spent in direct patient care.  Length of Stay:  LOS: 1 day   Vinie KYM Maxcy, MD, [facility], FNLA, FACP  [place]  [facility]  Medical Director of the [facility] Lip[facility] &  Cardiovascular Risk Reduction Clinic Diplomate of the American Board of Clinical Lipidology Attending Cardiologist  Direct Dial: [phone]  Fax: [phone]  Website:  [email or web address]  Vinie BROCKS Nadie Fiumara 11/13/2024, 10:39 AM

## 2024-11-14 DIAGNOSIS — R6510 Systemic inflammatory response syndrome (SIRS) of non-infectious origin without acute organ dysfunction: Secondary | ICD-10-CM | POA: Diagnosis not present

## 2024-11-14 DIAGNOSIS — I5030 Unspecified diastolic (congestive) heart failure: Secondary | ICD-10-CM | POA: Diagnosis not present

## 2024-11-14 LAB — URINE CULTURE: Culture: NO GROWTH

## 2024-11-14 MED ORDER — LOSARTAN POTASSIUM 25 MG PO TABS: 25.0000 mg | ORAL_TABLET | Freq: Every day | ORAL | Status: DC

## 2024-11-14 NOTE — Progress Notes (Signed)
 [name] to be discharged home per MD order. Discussed with the patient and all questions fully answered.  Skin clean, dry, and intact without evidence of skin break down. IV catheters discontinued intact. Site without signs and symptoms of complications. Dressing and pressure applied.  An After Visit Summary was printed and given to the patient.  Patient escorted via [facility], and discharged home via private auto.  [name]  11/14/2024

## 2024-11-14 NOTE — Discharge Summary (Signed)
 Physician Discharge Summary   Patient: [name] MRN: [id number] DOB: 1956/07/20  Admit date:     11/12/2024  Discharge date: 11/14/24  Discharge Physician: Nena Rebel   PCP: Katrinka Aquas, MD   Recommendations at discharge:   Follow-up with primary care provider as outpatient to adjust medication that might have caused toxic metabolic encephalopathy due to polypharmacy.  Discharge Diagnoses: Principal Problem:   SIRS (systemic inflammatory response syndrome) (HCC) Active Problems:   Septic shock (HCC)   Abnormal ECG   Heart failure with preserved ejection fraction (HFpEF) (HCC) It appears that patient did not have congestive heart failure. Resolved Problems:   * No resolved hospital problems. * Sepsis has been ruled out  Toxic-metabolic encephalopathy  may be related to polypharmacy Hospital Course: KARESSA ONORATO is a 68 y.o. female with a history of chronic pain, COPD, depression, diverticulosis, GERD, right eye blindness, PTSD, chronic respiratory failure on oxygen  .  Patient presented secondary to loss of consciousness of unclear etiology. EEG revealed no evidence of seizure activity. Presentation initially concerning for code STEMI, which was canceled after cardiology evaluated. Patient's presentation was also consistent with SIRS without source; empiric antibiotics started and cultures obtained. Patient's mental status improved to baseline. 11/30: Patient is afebrile.  Cultures have been negative.  Patient appears to be at baseline.  Encephalopathy resolved.  Likely cause of her presentation could be related to polypharmacy.  Patient will be discharged home on home medications.  Assessment and Plan:  68 year old female with multiple medical problems including but not limited to chronic pain syndrome on pain medications, COPD, depression, diverticulosis, GERD, right eye blindness, PTSD, chronic respiratory failure on home oxygen  during the night presented to ED  complaining of syncopal episode at home.  Neurology evaluated patient and EEG was performed.  Did not show any seizure-like activities.  Patient was also part of STEMI but which was canceled by cardiology as there was no signs of acute coronary syndromes.  Patient's symptoms also consistent with a source without source and empiric antibiotic was started and cultures were obtained.  Patient mentation came back to normal.  Patient is afebrile.  Cultures have been negative.  Patient will be discharged home on home medications.  Physical therapy and Occupational Therapy evaluation was done and they advised discharge home.  Extensive counseling was done patient agreed with the plan. Patient will be discharged home on her home medications.       Consultants: Neurology/cardiology Procedures performed: None Disposition: Home Diet recommendation:  Regular diet DISCHARGE MEDICATION: Allergies as of 11/14/2024       Reactions   Clindamycin  Nausea And Vomiting, Rash   Augmentin  [amoxicillin -pot Clavulanate] Other (See Comments)   GI upset   Ketamine Other (See Comments)   Made me act crazy   Naltrexone Other (See Comments)   Hallucinations, sleepiness, withdrawal s/s, poor apepitie   Nsaids    Advised not to take    Toradol  [ketorolac  Tromethamine ] Nausea And Vomiting, Swelling   TONGUE SWELLING   Tramadol  Nausea And Vomiting   Atorvastatin Other (See Comments), Itching, Nausea Only, Tinitus   Benadryl [diphenhydramine Hcl] Palpitations   Insomnia   Ceftin [cefuroxime Axetil] Rash   Compazine Palpitations   Insomnia   Epinephrine  Palpitations   Insomina        Medication List     TAKE these medications    albuterol  108 (90 Base) MCG/ACT inhaler Commonly known as: VENTOLIN  HFA Inhale 1-2 puffs into the lungs every 6 (six) hours as  needed for wheezing or shortness of breath.   albuterol  (2.5 MG/3ML) 0.083% nebulizer solution Commonly known as: PROVENTIL  Take 3 mLs (2.5 mg  total) by nebulization every 6 (six) hours as needed for wheezing or shortness of breath.   atorvastatin 20 MG tablet Commonly known as: LIPITOR Take 20 mg by mouth daily.   Breztri  Aerosphere 160-9-4.8 MCG/ACT Aero inhaler Generic drug: budesonide -glycopyrrolate -formoterol  inhale TWO PUFFS TWICE DAILY morning AND bedtime   carboxymethylcellulose 0.5 % Soln Commonly known as: REFRESH PLUS Place 2-4 drops into both eyes as needed (dry eyes).   clonazePAM  1 MG tablet Commonly known as: KLONOPIN  Take 0.5 tablets (0.5 mg total) by mouth 2 (two) times daily as needed for anxiety. *May take one additional tablet as needed for anxiety/PTSD What changed:  how much to take when to take this additional instructions   cyclobenzaprine  5 MG tablet Commonly known as: FLEXERIL  Take 5 mg by mouth 2 (two) times daily.   gabapentin  300 MG capsule Commonly known as: NEURONTIN  Take 600-900 mg by mouth See admin instructions. 900 mg in the morning, 600 mg midday, 900 mg at bedtime   loperamide  2 MG capsule Commonly known as: IMODIUM  Take 4 mg by mouth 2 (two) times daily.   oxyCODONE -acetaminophen  10-325 MG tablet Commonly known as: PERCOCET Take 1 tablet by mouth 4 (four) times daily.   OXYGEN  Inhale 3 L into the lungs at bedtime.   Santyl 250 UNIT/GM ointment Generic drug: collagenase Apply 1 Application topically daily. APPLY TO CLEANSED AFFECTED AREA BY TOPICAL ROUTE ONCE DAILY   Systane Nighttime Oint Place 1 Application into both eyes at bedtime.   Vitamin D3 125 MCG (5000 UT) Caps Take 1 capsule by mouth in the morning and at bedtime. Taking a day        Discharge Exam: Filed Weights   11/12/24 1800  Weight: 43.7 kg   Seen and examined at bedside General: Alert awake and oriented HEENT: Neck is supple no jugular send Lungs: Clear to auscultation Heart: Regular S1-S2 Abdomen: Soft bowel sounds open Extremities: No edema, right ankle has a pain which is chronic for 2  years  Condition at discharge: stable  The results of significant diagnostics from this hospitalization (including imaging, microbiology, ancillary and laboratory) are listed below for reference.   Imaging Studies: CT Angio Chest Pulmonary Embolism (PE) W or WO Contrast Result Date: 11/13/2024 EXAM: CTA CHEST 11/13/2024 07:09:41 AM TECHNIQUE: CTA of the chest was performed without and with the administration of 75 mL of iohexol  (OMNIPAQUE ) 350 MG/ML injection. Multiplanar reformatted images are provided for review. MIP images are provided for review. Automated exposure control, iterative reconstruction, and/or weight based adjustment of the mA/kV was utilized to reduce the radiation dose to as low as reasonably achievable. COMPARISON: CT of the chest dated 08/12/2023. CLINICAL HISTORY: Pulmonary embolism (PE) suspected, low to intermediate prob, positive D-dimer. FINDINGS: PULMONARY ARTERIES: Pulmonary arteries are adequately opacified for evaluation. No acute pulmonary embolus. Main pulmonary artery is normal in caliber. MEDIASTINUM: The heart and pericardium demonstrate no acute abnormality. There is mild calcific plaque within the aortic arch. LYMPH NODES: No mediastinal, hilar or axillary lymphadenopathy. LUNGS AND PLEURA: There is moderate central alveolar emphysema present. There is mild atelectasis present dependently within the lower lobes bilaterally. No focal consolidation or pulmonary edema. No evidence of pleural effusion or pneumothorax. UPPER ABDOMEN: Limited images of the upper abdomen are unremarkable. SOFT TISSUES AND BONES: No acute bone or soft tissue abnormality. IMPRESSION: 1. No evidence of  pulmonary embolus. 2. Moderate central alveolar emphysema. Given that emphysema is an independent risk factor for lung cancer, consider evaluation for eligibility for a low-dose CT lung cancer screening program if the patient is between 74 and 1 years old. 3. Mild atelectasis dependently within the  lower lobes bilaterally. 4. Mild calcific plaque within the aortic arch. Electronically signed by: Evalene Coho MD 11/13/2024 07:31 AM EST RP Workstation: [id number]   DG CHEST PORT 1 VIEW Result Date: 11/12/2024 CLINICAL DATA:  Hypoxia. EXAM: PORTABLE CHEST 1 VIEW COMPARISON:  November 12, 2024 FINDINGS: The heart size and mediastinal contours are within normal limits. The lungs are hyperinflated. Mild linear atelectasis is seen within the retrocardiac region of the left lung base. No focal consolidation, pleural effusion or pneumothorax is identified. The visualized skeletal structures are unremarkable. IMPRESSION: Mild left basilar linear atelectasis. Electronically Signed   By: Suzen Dials M.D.   On: 11/12/2024 18:23   EEG adult Result Date: 11/12/2024 Gregg Lek, MD     11/12/2024  2:56 PM Patient Name: LIARA HOLM MRN: [id number] Epilepsy Attending: Lek Gregg Referring Physician/Provider: No ref. provider found     Date: 11/12/2024 Duration: 25 minutes Patient history: 33 with altered mental status, EEG to evaluate for seizure Level of alertness: Awake, drowsy AEDs during EEG study: None Technical aspects: This EEG study was done with scalp electrodes positioned according to the 10-20 International system of electrode placement. Electrical activity was reviewed with band pass filter of 1-70Hz , sensitivity of 7 uV/mm, display speed of 33mm/sec with a 60Hz  notched filter applied as appropriate. EEG data were recorded continuously and digitally stored.  Video monitoring was available and reviewed as appropriate. Description: . EEG showed continuous generalized polymorphic sharply contoured 4 -6 Hz theta slowing. Sleep was not seen. Hyperventilation and photic stimulation were not performed.   ABNORMALITY - Continuous slow, generalized IMPRESSION: This study is suggestive of mild diffuse brain dysfunction such as encephalopathy, nonspecific etiology but likely related to sedation,  toxic-metabolic etiology. No seizures or epileptiform discharges were seen throughout the recording. Lek Gregg MD Neurology    CT CERVICAL SPINE WO CONTRAST Result Date: 11/12/2024 EXAM: CT CERVICAL SPINE WITHOUT CONTRAST 11/12/2024 01:42:26 PM TECHNIQUE: CT of the cervical spine was performed without the administration of intravenous contrast. Multiplanar reformatted images are provided for review. Automated exposure control, iterative reconstruction, and/or weight based adjustment of the mA/kV was utilized to reduce the radiation dose to as low as reasonably achievable. COMPARISON: Prior CT dated 05/18/2022. CLINICAL HISTORY: FINDINGS: CERVICAL SPINE: BONES AND ALIGNMENT: Reversal of the normal cervical lordosis which is likely at least partially positional in etiology. Trace anterolisthesis of C3 on C4. There is 3.5 mm anterolisthesis of C4 on C5 which appears increased from the prior CT. No facet subluxation or dislocation. No acute fracture. DEGENERATIVE CHANGES: Moderate disc space narrowing at C5-C6 and C6-C7 similar to prior. Degenerative endplate osteophytes in the lower cervical spine. Facet arthrosis and uncovertebral hypertrophy at multiple levels. There is no high grade osseous spinal canal stenosis. SOFT TISSUES: No prevertebral soft tissue swelling. LUNGS: Centrilobular emphysema in the upper lobes. IMPRESSION: 1. 3.5 mm anterolisthesis of C4 on C5, increased from prior CT at which time it measured approximately 2mm. Recommend correlation with tenderness at this level and consider MRI to evaluate for ligamentous injury. 2. No acute fracture. Electronically signed by: Donnice Mania MD 11/12/2024 02:04 PM EST RP Workstation: [id number]   CT HEAD WO CONTRAST Result Date: 11/12/2024 EXAM: CT HEAD WITHOUT 11/12/2024 01:42:26  PM TECHNIQUE: CT of the head was performed without the administration of intravenous contrast. Automated exposure control, iterative reconstruction, and/or weight based  adjustment of the mA/kV was utilized to reduce the radiation dose to as low as reasonably achievable. COMPARISON: 05/18/2022 CLINICAL HISTORY: headache syncope FINDINGS: BRAIN AND VENTRICLES: No acute intracranial hemorrhage. No mass effect or midline shift. No extra-axial fluid collection. No evidence of acute infarct. No hydrocephalus. Mild atherosclerosis of the carotid siphons. ORBITS: No acute abnormality. SINUSES AND MASTOIDS: There is mild mucosal thickening in the partially visualized ethmoid and maxillary sinuses. SOFT TISSUES AND SKULL: No acute skull fracture. No acute soft tissue abnormality. IMPRESSION: 1. No acute intracranial abnormality. 2. Mild mucosal thickening in the partially visualized ethmoid and maxillary sinuses. Electronically signed by: Donnice Mania MD 11/12/2024 01:57 PM EST RP Workstation: [id number]   DG Chest Port 1 View Result Date: 11/12/2024 CLINICAL DATA:  Syncope. EXAM: PORTABLE CHEST 1 VIEW COMPARISON:  04/24/2022 and older exams.  CT, 08/12/2023. FINDINGS: Cardiac silhouette is normal in size. No mediastinal or hilar masses. Lungs are hyperexpanded. Prominent interstitial markings, chronic. Mild reticulonodular opacities in the left lateral lung base consistent with atelectasis/scarring. No evidence of pneumonia or pulmonary edema. No pleural effusion or pneumothorax. Skeletal structures are grossly intact. IMPRESSION: No active disease. Electronically Signed   By: Alm Parkins M.D.   On: 11/12/2024 13:40    Microbiology: Results for orders placed or performed during the hospital encounter of 11/12/24  Resp panel by RT-PCR (RSV, Flu A&B, Covid) Anterior Nasal Swab     Status: None   Collection Time: 11/12/24  1:44 PM   Specimen: Anterior Nasal Swab  Result Value Ref Range Status   SARS Coronavirus 2 by RT PCR NEGATIVE NEGATIVE Final   Influenza A by PCR NEGATIVE NEGATIVE Final   Influenza B by PCR NEGATIVE NEGATIVE Final    Comment: (NOTE) The Xpert Xpress  SARS-CoV-2/FLU/RSV plus assay is intended as an aid in the diagnosis of influenza from Nasopharyngeal swab specimens and should not be used as a sole basis for treatment. Nasal washings and aspirates are unacceptable for Xpert Xpress SARS-CoV-2/FLU/RSV testing.  Fact Sheet for Patients: bloggercourse.com  Fact Sheet for Healthcare Providers: seriousbroker.it  This test is not yet approved or cleared by the [place]  FDA and has been authorized for detection and/or diagnosis of SARS-CoV-2 by FDA under an Emergency Use Authorization (EUA). This EUA will remain in effect (meaning this test can be used) for the duration of the COVID-19 declaration under Section 564(b)(1) of the Act, 21 U.S.C. section 360bbb-3(b)(1), unless the authorization is terminated or revoked.     Resp Syncytial Virus by PCR NEGATIVE NEGATIVE Final    Comment: (NOTE) Fact Sheet for Patients: bloggercourse.com  Fact Sheet for Healthcare Providers: seriousbroker.it  This test is not yet approved or cleared by the [place]  FDA and has been authorized for detection and/or diagnosis of SARS-CoV-2 by FDA under an Emergency Use Authorization (EUA). This EUA will remain in effect (meaning this test can be used) for the duration of the COVID-19 declaration under Section 564(b)(1) of the Act, 21 U.S.C. section 360bbb-3(b)(1), unless the authorization is terminated or revoked.  Performed at [facility], [address], [place], [place] [address]   Blood Culture (routine x 2)     Status: None (Preliminary result)   Collection Time: 11/12/24  2:10 PM   Specimen: BLOOD LEFT HAND  Result Value Ref Range Status   Specimen Description BLOOD LEFT HAND  Final   Special Requests   Final    BOTTLES DRAWN AEROBIC AND ANAEROBIC Blood Culture adequate volume   Culture   Final    NO GROWTH 2 DAYS Performed at  [facility], [address], [place], [place] [address]    Report Status PENDING  Incomplete  Blood Culture (routine x 2)     Status: None (Preliminary result)   Collection Time: 11/12/24  2:16 PM   Specimen: BLOOD LEFT WRIST  Result Value Ref Range Status   Specimen Description BLOOD LEFT WRIST  Final   Special Requests   Final    BOTTLES DRAWN AEROBIC AND ANAEROBIC Blood Culture results may not be optimal due to an inadequate volume of blood received in culture bottles   Culture   Final    NO GROWTH 2 DAYS Performed at [facility], [address] [place], [place] [address]    Report Status PENDING  Incomplete  Urine Culture (for pregnant, neutropenic or urologic patients or patients with an indwelling urinary catheter)     Status: None   Collection Time: 11/12/24  5:31 PM   Specimen: In/Out Cath Urine  Result Value Ref Range Status   Specimen Description IN/OUT CATH URINE  Final   Special Requests NONE  Final   Culture   Final    NO GROWTH Performed at [facility], [address], [place], [place] [address]    Report Status 11/14/2024 FINAL  Final    Labs: CBC: Recent Labs  Lab 11/12/24 1250 11/12/24 1255 11/13/24 0315  WBC 15.1*  --  13.6*  NEUTROABS 12.9*  --   --   HGB 17.5* 19.0*  19.0* 14.9  HCT 55.9* 56.0*  56.0* 45.6  MCV 102.2*  --  96.2  PLT 284  --  294   Basic Metabolic Panel: Recent Labs  Lab 11/12/24 1250 11/12/24 1255 11/13/24 0315  NA 142 139  141 139  K 4.1 3.8  3.8 3.7  CL 100 99 99  CO2 26  --  30  GLUCOSE 110* 108* 87  BUN 18 21 16   CREATININE 1.22* 1.20* 0.77  CALCIUM 8.8*  --  8.1*  MG 2.3  --   --    Liver Function Tests: Recent Labs  Lab 11/12/24 1250 11/13/24 0315  AST 101* 59*  ALT 68* 50*  ALKPHOS 133* 108  BILITOT 0.6 0.6  PROT 7.2 5.6*  ALBUMIN 3.6 2.8*   CBG: Recent Labs  Lab 11/12/24 1240 11/12/24 1342  GLUCAP 112* 105*    Discharge time spent: greater than 30  minutes.  Signed: Nena Rebel, MD [facility] 11/14/2024

## 2024-11-14 NOTE — Evaluation (Signed)
 Physical Therapy Evaluation Patient Details Name: [name] MRN: [id number] DOB: [birth year] Today's Date: 11/14/2024  History of Present Illness  68 yo female admitted with LOC and encephalopathy PMH chronic pain, copd,IBS,panic disorder depression diverticulosis GERD R eye blindness PTSD chronic RF on oxygen  3L at night  Clinical Impression  Pt admitted with above diagnosis. Lives at home with husband, in a single-level home with a ramped entrance; Prior to admission, pt was able to manage household distance with rollaotr, even at time sitting in rollator and being pushed by husband; Presents to PT with generalized weakness, decr activity tolerance, and gait deviations related to previous ankle surgeries; Needs up to min assist with transfers and short distance amb; REc getting home with HHPT follow up;  Pt currently with functional limitations due to the deficits listed below (see PT Problem List). Pt will benefit from skilled PT to increase their independence and safety with mobility to allow discharge to the venue listed below.           If plan is discharge home, recommend the following: A little help with walking and/or transfers;A little help with bathing/dressing/bathroom;Assistance with cooking/housework   Can travel by Doctor, Hospital (measurements PT) (Need for wheelchair can be addressed at [employer] appointments)  Recommendations for Other Services       Functional Status Assessment Patient has had a recent decline in their functional status and demonstrates the ability to make significant improvements in function in a reasonable and predictable amount of time.     Precautions / Restrictions Precautions Precautions: Fall Recall of Precautions/Restrictions: Intact Restrictions Weight Bearing Restrictions Per Provider Order: No      Mobility  Bed Mobility Overal bed mobility: Modified Independent              General bed mobility comments: HOB mildly elevated    Transfers Overall transfer level: Needs assistance Equipment used: Rolling walker (2 wheels) Transfers: Sit to/from Stand Sit to Stand: Contact guard assist           General transfer comment: CGA, cues for hand placement    Ambulation/Gait Ambulation/Gait assistance: Contact guard assist Gait Distance (Feet): 25 Feet Assistive device: Rolling walker (2 wheels) Gait Pattern/deviations: Step-to pattern       General Gait Details: Decr weight acceptance RLE in stance, with tendency to only contact the floor with forefoot; good use of RW and bil UE support  Stairs            Wheelchair Mobility     Tilt Bed    Modified Rankin (Stroke Patients Only)       Balance Overall balance assessment: Needs assistance Sitting-balance support: Feet supported, No upper extremity supported Sitting balance-Leahy Scale: Fair     Standing balance support: No upper extremity supported, Bilateral upper extremity supported, During functional activity Standing balance-Leahy Scale: Poor Standing balance comment: Reliant on RW                             Pertinent Vitals/Pain Pain Assessment Pain Assessment: 0-10 Pain Score: 7  Pain Location: R ankle Pain Descriptors / Indicators: Discomfort Pain Intervention(s): Repositioned    Home Living Family/patient expects to be discharged to:: Private residence Living Arrangements: Spouse/significant other Available Help at Discharge: Family;Available 24 hours/day Type of Home: House Home Access: Ramped entrance       Home Layout: One level Home Equipment:  Rollator (4 wheels);BSC/3in1;Shower seat;Grab bars - tub/shower;Grab bars - toilet;Wheelchair - manual      Prior Function Prior Level of Function : Independent/Modified Independent             Mobility Comments: use of rollator, sometimes will use if as a w/c ADLs Comments: Ind      Extremity/Trunk Assessment   Upper Extremity Assessment Upper Extremity Assessment: Defer to OT evaluation    Lower Extremity Assessment Lower Extremity Assessment: RLE deficits/detail RLE Deficits / Details: AROM grossly WFL hip and knee; noting tends to keep weight off of R ankle in stance    Cervical / Trunk Assessment Cervical / Trunk Assessment: Kyphotic  Communication   Communication Communication: No apparent difficulties    Cognition Arousal: Alert Behavior During Therapy: WFL for tasks assessed/performed                             Following commands: Intact       Cueing Cueing Techniques: Verbal cues     General Comments General comments (skin integrity, edema, etc.): VSS on RA    Exercises     Assessment/Plan    PT Assessment Patient needs continued PT services  PT Problem List Decreased strength;Decreased activity tolerance;Decreased balance;Decreased mobility;Decreased knowledge of use of DME;Decreased knowledge of precautions       PT Treatment Interventions DME instruction;Gait training;Stair training;Functional mobility training;Therapeutic activities;Therapeutic exercise;Balance training;Neuromuscular re-education;Cognitive remediation;Patient/family education;Wheelchair mobility training;Manual techniques;Modalities    PT Goals (Current goals can be found in the Care Plan section)  Acute Rehab PT Goals Patient Stated Goal: to get home before bad weather PT Goal Formulation: With patient Time For Goal Achievement: 11/22/24 Potential to Achieve Goals: Good    Frequency Min 3X/week     Co-evaluation   Reason for Co-Treatment: To address functional/ADL transfers   OT goals addressed during session: ADL's and self-care       AM-PAC PT 6 Clicks Mobility  Outcome Measure Help needed turning from your back to your side while in a flat bed without using bedrails?: None Help needed moving from lying on your back to sitting on  the side of a flat bed without using bedrails?: A Little Help needed moving to and from a bed to a chair (including a wheelchair)?: A Little Help needed standing up from a chair using your arms (e.g., wheelchair or bedside chair)?: A Little Help needed to walk in hospital room?: A Little Help needed climbing 3-5 steps with a railing? : A Lot 6 Click Score: 18    End of Session Equipment Utilized During Treatment: Gait belt Activity Tolerance: Patient tolerated treatment well Patient left: in bed Nurse Communication: Mobility status PT Visit Diagnosis: Unsteadiness on feet (R26.81);Other abnormalities of gait and mobility (R26.89);Pain Pain - Right/Left: Right Pain - part of body: Ankle and joints of foot    Time: 8996-8961 PT Time Calculation (min) (ACUTE ONLY): 35 min   Charges:   PT Evaluation $PT Eval Low Complexity: 1 Low   PT General Charges $$ ACUTE PT VISIT: 1 Visit         Silvano Currier, PT  Acute Rehabilitation Services Office [phone] Secure Chat welcomed   Silvano VEAR Currier 11/14/2024, 2:13 PM

## 2024-11-14 NOTE — Progress Notes (Signed)
 DAILY PROGRESS NOTE   Patient Name: [name] Date of Encounter: 11/14/2024 Cardiologist: Oneil Parchment, MD  Chief Complaint   No chest pain  Patient Profile   [name] is a 68 y.o. female with a hx of tobacco use, COPD, IBS, GERD who is being seen 11/12/2024 for the evaluation of abnormal EKG at the request of Dr. Yolande.   Subjective   BP normal to mildly elevated -now on low dose losartan. Creatinine normal. No chest pain.   Objective   Vitals:   11/13/24 1945 11/13/24 2308 11/14/24 0418 11/14/24 0718  BP: 131/79 124/75 138/77 (!) 142/85  Pulse: 87 95 77 83  Resp:   10 15  Temp: 98.1 F (36.7 C) 98.2 F (36.8 C) 98.1 F (36.7 C) 98 F (36.7 C)  TempSrc: Oral Oral Oral Oral  SpO2: 95% (!) 89% 98% 98%  Weight:        Intake/Output Summary (Last 24 hours) at 11/14/2024 1127 Last data filed at 11/13/2024 2036 Gross per 24 hour  Intake 579.34 ml  Output --  Net 579.34 ml   Filed Weights   11/12/24 1800  Weight: 43.7 kg    Physical Exam   General appearance: alert and no distress Lungs: clear to auscultation bilaterally Heart: regular rate and rhythm Extremities: extremities normal, atraumatic, no cyanosis or edema and venous stasis dermatitis noted Neurologic: Grossly normal  Inpatient Medications    Scheduled Meds:  budesonide -glycopyrrolate -formoterol   2 puff Inhalation BID   enoxaparin  (LOVENOX ) injection  30 mg Subcutaneous Q24H   loperamide   4 mg Oral Daily   losartan  12.5 mg Oral Daily   nicotine   21 mg Transdermal Daily    Continuous Infusions:  piperacillin-tazobactam 3.375 g (11/14/24 1053)   vancomycin  Stopped (11/13/24 1624)    PRN Meds: acetaminophen  **OR** acetaminophen , albuterol , artificial tears, clonazePAM , loperamide  **AND** loperamide , ondansetron  **OR** ondansetron  (ZOFRAN ) IV, oxyCODONE -acetaminophen  **AND** oxyCODONE    Labs   Results for orders placed or performed during the hospital encounter of  11/12/24 (from the past 48 hours)  CBG monitoring, ED     Status: Abnormal   Collection Time: 11/12/24 12:40 PM  Result Value Ref Range   Glucose-Capillary 112 (H) 70 - 99 mg/dL    Comment: Glucose reference range applies only to samples taken after fasting for at least 8 hours.  Comprehensive metabolic panel     Status: Abnormal   Collection Time: 11/12/24 12:50 PM  Result Value Ref Range   Sodium 142 135 - 145 mmol/L   Potassium 4.1 3.5 - 5.1 mmol/L   Chloride 100 98 - 111 mmol/L   CO2 26 22 - 32 mmol/L   Glucose, Bld 110 (H) 70 - 99 mg/dL    Comment: Glucose reference range applies only to samples taken after fasting for at least 8 hours.   BUN 18 8 - 23 mg/dL   Creatinine, Ser 8.77 (H) 0.44 - 1.00 mg/dL   Calcium 8.8 (L) 8.9 - 10.3 mg/dL   Total Protein 7.2 6.5 - 8.1 g/dL   Albumin 3.6 3.5 - 5.0 g/dL   AST 898 (H) 15 - 41 U/L   ALT 68 (H) 0 - 44 U/L   Alkaline Phosphatase 133 (H) 38 - 126 U/L   Total Bilirubin 0.6 0.0 - 1.2 mg/dL   GFR, Estimated 49 (L) >60 mL/min    Comment: (NOTE) Calculated using the CKD-EPI Creatinine Equation (2021)    Anion gap 16 (H) 5 - 15  Comment: Performed at [facility], [address] [place], [place] [address]  CBC WITH DIFFERENTIAL     Status: Abnormal   Collection Time: 11/12/24 12:50 PM  Result Value Ref Range   WBC 15.1 (H) 4.0 - 10.5 K/uL   RBC 5.47 (H) 3.87 - 5.11 MIL/uL   Hemoglobin 17.5 (H) 12.0 - 15.0 g/dL   HCT 44.0 (H) 63.9 - 53.9 %   MCV 102.2 (H) 80.0 - 100.0 fL   MCH 32.0 26.0 - 34.0 pg   MCHC 31.3 30.0 - 36.0 g/dL   RDW 85.7 88.4 - 84.4 %   Platelets 284 150 - 400 K/uL   nRBC 0.0 0.0 - 0.2 %   Neutrophils Relative % 85 %   Neutro Abs 12.9 (H) 1.7 - 7.7 K/uL   Lymphocytes Relative 8 %   Lymphs Abs 1.1 0.7 - 4.0 K/uL   Monocytes Relative 6 %   Monocytes Absolute 0.9 0.1 - 1.0 K/uL   Eosinophils Relative 0 %   Eosinophils Absolute 0.0 0.0 - 0.5 K/uL   Basophils Relative 0 %   Basophils Absolute 0.0 0.0 - 0.1  K/uL   Immature Granulocytes 1 %   Abs Immature Granulocytes 0.08 (H) 0.00 - 0.07 K/uL    Comment: Performed at [facility], [address], [place], [place] [address]  Ethanol     Status: None   Collection Time: 11/12/24 12:50 PM  Result Value Ref Range   Alcohol , Ethyl (B) <15 <15 mg/dL    Comment: (NOTE) For medical purposes only. Performed at [facility], [address], [place], [place] [address]   Magnesium     Status: None   Collection Time: 11/12/24 12:50 PM  Result Value Ref Range   Magnesium 2.3 1.7 - 2.4 mg/dL    Comment: Performed at [facility], [address] [place], [place] [address]  Troponin I (High Sensitivity)     Status: Abnormal   Collection Time: 11/12/24 12:50 PM  Result Value Ref Range   Troponin I (High Sensitivity) 32 (H) <18 ng/L    Comment: (NOTE) Elevated high sensitivity troponin I (hsTnI) values and significant  changes across serial measurements may suggest ACS but many other  chronic and acute conditions are known to elevate hsTnI results.  Refer to the Links section for chest pain algorithms and additional  guidance. Performed at [facility], [address] [place] [place], [place] [address]   I-Stat Chem 8, ED     Status: Abnormal   Collection Time: 11/12/24 12:55 PM  Result Value Ref Range   Sodium 141 135 - 145 mmol/L   Potassium 3.8 3.5 - 5.1 mmol/L   Chloride 99 98 - 111 mmol/L   BUN 21 8 - 23 mg/dL   Creatinine, Ser 8.79 (H) 0.44 - 1.00 mg/dL   Glucose, Bld 891 (H) 70 - 99 mg/dL    Comment: Glucose reference range applies only to samples taken after fasting for at least 8 hours.   Calcium, Ion 1.00 (L) 1.15 - 1.40 mmol/L   TCO2 28 22 - 32 mmol/L   Hemoglobin 19.0 (H) 12.0 - 15.0 g/dL   HCT 43.9 (H) 63.9 - 53.9 %  I-Stat CG4 Lactic Acid     Status: Abnormal   Collection Time: 11/12/24 12:55 PM  Result Value Ref Range   Lactic Acid, Venous 3.3 (HH) 0.5 - 1.9 mmol/L   Comment NOTIFIED PHYSICIAN   I-Stat  venous  blood gas, ([facility] ED, [facility], [facility])     Status: Abnormal   Collection Time: 11/12/24 12:55 PM  Result Value Ref Range   pH, Ven 7.356 7.25 - 7.43   pCO2, Ven 57.1 44 - 60 mmHg   pO2, Ven 21 (LL) 32 - 45 mmHg   Bicarbonate 31.9 (H) 20.0 - 28.0 mmol/L   TCO2 34 (H) 22 - 32 mmol/L   O2 Saturation 30 %   Acid-Base Excess 4.0 (H) 0.0 - 2.0 mmol/L   Sodium 139 135 - 145 mmol/L   Potassium 3.8 3.5 - 5.1 mmol/L   Calcium, Ion 0.98 (L) 1.15 - 1.40 mmol/L   HCT 56.0 (H) 36.0 - 46.0 %   Hemoglobin 19.0 (H) 12.0 - 15.0 g/dL   Sample type VENOUS    Comment NOTIFIED PHYSICIAN   CBG monitoring, ED     Status: Abnormal   Collection Time: 11/12/24  1:42 PM  Result Value Ref Range   Glucose-Capillary 105 (H) 70 - 99 mg/dL    Comment: Glucose reference range applies only to samples taken after fasting for at least 8 hours.  Resp panel by RT-PCR (RSV, Flu A&B, Covid) Anterior Nasal Swab     Status: None   Collection Time: 11/12/24  1:44 PM   Specimen: Anterior Nasal Swab  Result Value Ref Range   SARS Coronavirus 2 by RT PCR NEGATIVE NEGATIVE   Influenza A by PCR NEGATIVE NEGATIVE   Influenza B by PCR NEGATIVE NEGATIVE    Comment: (NOTE) The Xpert Xpress SARS-CoV-2/FLU/RSV plus assay is intended as an aid in the diagnosis of influenza from Nasopharyngeal swab specimens and should not be used as a sole basis for treatment. Nasal washings and aspirates are unacceptable for Xpert Xpress SARS-CoV-2/FLU/RSV testing.  Fact Sheet for Patients: bloggercourse.com  Fact Sheet for Healthcare Providers: seriousbroker.it  This test is not yet approved or cleared by the [place]  FDA and has been authorized for detection and/or diagnosis of SARS-CoV-2 by FDA under an Emergency Use Authorization (EUA). This EUA will remain in effect (meaning this test can be used) for the duration of the COVID-19 declaration under Section 564(b)(1) of the Act, 21  U.S.C. section 360bbb-3(b)(1), unless the authorization is terminated or revoked.     Resp Syncytial Virus by PCR NEGATIVE NEGATIVE    Comment: (NOTE) Fact Sheet for Patients: bloggercourse.com  Fact Sheet for Healthcare Providers: seriousbroker.it  This test is not yet approved or cleared by the [place]  FDA and has been authorized for detection and/or diagnosis of SARS-CoV-2 by FDA under an Emergency Use Authorization (EUA). This EUA will remain in effect (meaning this test can be used) for the duration of the COVID-19 declaration under Section 564(b)(1) of the Act, 21 U.S.C. section 360bbb-3(b)(1), unless the authorization is terminated or revoked.  Performed at [facility], [address], [place], [place] [address]   Blood Culture (routine x 2)     Status: None (Preliminary result)   Collection Time: 11/12/24  2:10 PM   Specimen: BLOOD LEFT HAND  Result Value Ref Range   Specimen Description BLOOD LEFT HAND    Special Requests      BOTTLES DRAWN AEROBIC AND ANAEROBIC Blood Culture adequate volume   Culture      NO GROWTH 2 DAYS Performed at [facility], [address] [place], [place] [address]    Report Status PENDING   Blood Culture (routine x 2)     Status: None (Preliminary result)  Collection Time: 11/12/24  2:16 PM   Specimen: BLOOD LEFT WRIST  Result Value Ref Range   Specimen Description BLOOD LEFT WRIST    Special Requests      BOTTLES DRAWN AEROBIC AND ANAEROBIC Blood Culture results may not be optimal due to an inadequate volume of blood received in culture bottles   Culture      NO GROWTH 2 DAYS Performed at [facility], [address], [place], [place] [address]    Report Status PENDING   Troponin I (High Sensitivity)     Status: Abnormal   Collection Time: 11/12/24  2:48 PM  Result Value Ref Range   Troponin I (High Sensitivity) 25 (H) <18 ng/L    Comment:  (NOTE) Elevated high sensitivity troponin I (hsTnI) values and significant  changes across serial measurements may suggest ACS but many other  chronic and acute conditions are known to elevate hsTnI results.  Refer to the Links section for chest pain algorithms and additional  guidance. Performed at [facility], [address] [place] [address] [place], [place] [address]   CK     Status: None   Collection Time: 11/12/24  2:48 PM  Result Value Ref Range   Total CK 141 38 - 234 U/L    Comment: Performed at [facility], [address] [place], [place] [address]  I-Stat CG4 Lactic Acid     Status: None   Collection Time: 11/12/24  3:33 PM  Result Value Ref Range   Lactic Acid, Venous 1.9 0.5 - 1.9 mmol/L  Rapid urine drug screen (hospital performed)     Status: Abnormal   Collection Time: 11/12/24  4:53 PM  Result Value Ref Range   Opiates POSITIVE (A) NONE DETECTED   Cocaine NONE DETECTED NONE DETECTED   Benzodiazepines POSITIVE (A) NONE DETECTED   Amphetamines NONE DETECTED NONE DETECTED   Tetrahydrocannabinol NONE DETECTED NONE DETECTED   Barbiturates NONE DETECTED NONE DETECTED    Comment: (NOTE) DRUG SCREEN FOR MEDICAL PURPOSES ONLY.  IF CONFIRMATION IS NEEDED FOR ANY PURPOSE, NOTIFY LAB WITHIN 5 DAYS.  LOWEST DETECTABLE LIMITS FOR URINE DRUG SCREEN Drug Class                     Cutoff (ng/mL) Amphetamine and metabolites    1000 Barbiturate and metabolites    200 Benzodiazepine                 200 Opiates and metabolites        300 Cocaine and metabolites        300 THC                            50 Performed at [facility], [address] [place], [place] [address]   Urinalysis, Routine w reflex microscopic -Urine, Clean Catch     Status: Abnormal   Collection Time: 11/12/24  4:53 PM  Result Value Ref Range   Color, Urine YELLOW YELLOW   APPearance CLEAR CLEAR   Specific Gravity, Urine 1.025 1.005 - 1.030   pH 5.0 5.0 - 8.0   Glucose, UA NEGATIVE  NEGATIVE mg/dL   Hgb urine dipstick NEGATIVE NEGATIVE   Bilirubin Urine NEGATIVE NEGATIVE   Ketones, ur 5 (A) NEGATIVE mg/dL   Protein, ur NEGATIVE NEGATIVE mg/dL   Nitrite NEGATIVE NEGATIVE   Leukocytes,Ua NEGATIVE NEGATIVE    Comment: Performed at [facility]  Lab, [address] [place], [place] [address]  Urine Culture (for pregnant, neutropenic or urologic patients or patients with an indwelling urinary catheter)     Status: None   Collection Time: 11/12/24  5:31 PM   Specimen: In/Out Cath Urine  Result Value Ref Range   Specimen Description IN/OUT CATH URINE    Special Requests NONE    Culture      NO GROWTH Performed at [facility], [address], [place], [place] [address]    Report Status 11/14/2024 FINAL   D-dimer, quantitative     Status: Abnormal   Collection Time: 11/12/24 11:44 PM  Result Value Ref Range   D-Dimer, Quant 3.12 (H) 0.00 - 0.50 ug/mL-FEU    Comment: (NOTE) At the manufacturer cut-off value of 0.5 g/mL FEU, this assay has a negative predictive value of 95-100%.This assay is intended for use in conjunction with a clinical pretest probability (PTP) assessment model to exclude pulmonary embolism (PE) and deep venous thrombosis (DVT) in outpatients suspected of PE or DVT. Results should be correlated with clinical presentation. Performed at [facility], [address] [place], [place] [address]   HIV Antibody (routine testing w rflx)     Status: None   Collection Time: 11/12/24 11:44 PM  Result Value Ref Range   HIV Screen 4th Generation wRfx Non Reactive Non Reactive    Comment: Performed at [facility], [address] [place], [place] [address]  Comprehensive metabolic panel     Status: Abnormal   Collection Time: 11/13/24  3:15 AM  Result Value Ref Range   Sodium 139 135 - 145 mmol/L   Potassium 3.7 3.5 - 5.1 mmol/L   Chloride 99 98 - 111 mmol/L   CO2 30 22 - 32 mmol/L   Glucose, Bld 87 70 - 99 mg/dL    Comment:  Glucose reference range applies only to samples taken after fasting for at least 8 hours.   BUN 16 8 - 23 mg/dL   Creatinine, Ser 9.22 0.44 - 1.00 mg/dL   Calcium 8.1 (L) 8.9 - 10.3 mg/dL   Total Protein 5.6 (L) 6.5 - 8.1 g/dL   Albumin 2.8 (L) 3.5 - 5.0 g/dL   AST 59 (H) 15 - 41 U/L   ALT 50 (H) 0 - 44 U/L   Alkaline Phosphatase 108 38 - 126 U/L   Total Bilirubin 0.6 0.0 - 1.2 mg/dL   GFR, Estimated >39 >39 mL/min    Comment: (NOTE) Calculated using the CKD-EPI Creatinine Equation (2021)    Anion gap 10 5 - 15    Comment: Performed at [facility], [address], [place], [place] [address]  CBC     Status: Abnormal   Collection Time: 11/13/24  3:15 AM  Result Value Ref Range   WBC 13.6 (H) 4.0 - 10.5 K/uL   RBC 4.74 3.87 - 5.11 MIL/uL   Hemoglobin 14.9 12.0 - 15.0 g/dL   HCT 54.3 63.9 - 53.9 %   MCV 96.2 80.0 - 100.0 fL   MCH 31.4 26.0 - 34.0 pg   MCHC 32.7 30.0 - 36.0 g/dL   RDW 86.0 88.4 - 84.4 %   Platelets 294 150 - 400 K/uL   nRBC 0.0 0.0 - 0.2 %    Comment: Performed at [facility], [address], [place], [place] [address]    ECG   N/A  Telemetry   Sinus rhythm - Personally Reviewed  Radiology    CT Angio Chest Pulmonary Embolism (PE) W or WO Contrast Result Date: 11/13/2024 EXAM: CTA CHEST 11/13/2024 07:09:41 AM TECHNIQUE: CTA of the chest was performed without and with the administration of 75 mL of iohexol  (OMNIPAQUE ) 350 MG/ML injection. Multiplanar reformatted images are provided for review. MIP images are provided for review. Automated exposure control, iterative reconstruction, and/or weight based adjustment of the mA/kV was utilized to reduce the radiation dose to as low as reasonably achievable. COMPARISON: CT of the chest dated 08/12/2023. CLINICAL HISTORY: Pulmonary embolism (PE) suspected, low to intermediate prob, positive D-dimer. FINDINGS: PULMONARY ARTERIES: Pulmonary arteries are adequately opacified for evaluation. No acute  pulmonary embolus. Main pulmonary artery is normal in caliber. MEDIASTINUM: The heart and pericardium demonstrate no acute abnormality. There is mild calcific plaque within the aortic arch. LYMPH NODES: No mediastinal, hilar or axillary lymphadenopathy. LUNGS AND PLEURA: There is moderate central alveolar emphysema present. There is mild atelectasis present dependently within the lower lobes bilaterally. No focal consolidation or pulmonary edema. No evidence of pleural effusion or pneumothorax. UPPER ABDOMEN: Limited images of the upper abdomen are unremarkable. SOFT TISSUES AND BONES: No acute bone or soft tissue abnormality. IMPRESSION: 1. No evidence of pulmonary embolus. 2. Moderate central alveolar emphysema. Given that emphysema is an independent risk factor for lung cancer, consider evaluation for eligibility for a low-dose CT lung cancer screening program if the patient is between 83 and 20 years old. 3. Mild atelectasis dependently within the lower lobes bilaterally. 4. Mild calcific plaque within the aortic arch. Electronically signed by: Evalene Coho MD 11/13/2024 07:31 AM EST RP Workstation: [id number]   DG CHEST PORT 1 VIEW Result Date: 11/12/2024 CLINICAL DATA:  Hypoxia. EXAM: PORTABLE CHEST 1 VIEW COMPARISON:  November 12, 2024 FINDINGS: The heart size and mediastinal contours are within normal limits. The lungs are hyperinflated. Mild linear atelectasis is seen within the retrocardiac region of the left lung base. No focal consolidation, pleural effusion or pneumothorax is identified. The visualized skeletal structures are unremarkable. IMPRESSION: Mild left basilar linear atelectasis. Electronically Signed   By: Suzen Dials M.D.   On: 11/12/2024 18:23   EEG adult Result Date: 11/12/2024 Gregg Lek, MD     11/12/2024  2:56 PM Patient Name: [name] MRN: [id number] Epilepsy Attending: Lek Gregg Referring Physician/Provider: No ref. provider found     Date: 11/12/2024  Duration: 25 minutes Patient history: 69 with altered mental status, EEG to evaluate for seizure Level of alertness: Awake, drowsy AEDs during EEG study: None Technical aspects: This EEG study was done with scalp electrodes positioned according to the 10-20 International system of electrode placement. Electrical activity was reviewed with band pass filter of 1-70Hz , sensitivity of 7 uV/mm, display speed of 46mm/sec with a 60Hz  notched filter applied as appropriate. EEG data were recorded continuously and digitally stored.  Video monitoring was available and reviewed as appropriate. Description: . EEG showed continuous generalized polymorphic sharply contoured 4 -6 Hz theta slowing. Sleep was not seen. Hyperventilation and photic stimulation were not performed.   ABNORMALITY - Continuous slow, generalized IMPRESSION: This study is suggestive of mild diffuse brain dysfunction such as encephalopathy, nonspecific etiology but likely related to sedation, toxic-metabolic etiology. No seizures or epileptiform discharges were seen throughout the recording. Lek Gregg MD Neurology    CT CERVICAL SPINE WO CONTRAST Result Date: 11/12/2024 EXAM: CT CERVICAL SPINE WITHOUT CONTRAST 11/12/2024 01:42:26 PM TECHNIQUE: CT of the cervical spine was performed without the administration of intravenous contrast. Multiplanar  reformatted images are provided for review. Automated exposure control, iterative reconstruction, and/or weight based adjustment of the mA/kV was utilized to reduce the radiation dose to as low as reasonably achievable. COMPARISON: Prior CT dated 05/18/2022. CLINICAL HISTORY: FINDINGS: CERVICAL SPINE: BONES AND ALIGNMENT: Reversal of the normal cervical lordosis which is likely at least partially positional in etiology. Trace anterolisthesis of C3 on C4. There is 3.5 mm anterolisthesis of C4 on C5 which appears increased from the prior CT. No facet subluxation or dislocation. No acute fracture. DEGENERATIVE  CHANGES: Moderate disc space narrowing at C5-C6 and C6-C7 similar to prior. Degenerative endplate osteophytes in the lower cervical spine. Facet arthrosis and uncovertebral hypertrophy at multiple levels. There is no high grade osseous spinal canal stenosis. SOFT TISSUES: No prevertebral soft tissue swelling. LUNGS: Centrilobular emphysema in the upper lobes. IMPRESSION: 1. 3.5 mm anterolisthesis of C4 on C5, increased from prior CT at which time it measured approximately 2mm. Recommend correlation with tenderness at this level and consider MRI to evaluate for ligamentous injury. 2. No acute fracture. Electronically signed by: Donnice Mania MD 11/12/2024 02:04 PM EST RP Workstation: [id number]   CT HEAD WO CONTRAST Result Date: 11/12/2024 EXAM: CT HEAD WITHOUT 11/12/2024 01:42:26 PM TECHNIQUE: CT of the head was performed without the administration of intravenous contrast. Automated exposure control, iterative reconstruction, and/or weight based adjustment of the mA/kV was utilized to reduce the radiation dose to as low as reasonably achievable. COMPARISON: 05/18/2022 CLINICAL HISTORY: headache syncope FINDINGS: BRAIN AND VENTRICLES: No acute intracranial hemorrhage. No mass effect or midline shift. No extra-axial fluid collection. No evidence of acute infarct. No hydrocephalus. Mild atherosclerosis of the carotid siphons. ORBITS: No acute abnormality. SINUSES AND MASTOIDS: There is mild mucosal thickening in the partially visualized ethmoid and maxillary sinuses. SOFT TISSUES AND SKULL: No acute skull fracture. No acute soft tissue abnormality. IMPRESSION: 1. No acute intracranial abnormality. 2. Mild mucosal thickening in the partially visualized ethmoid and maxillary sinuses. Electronically signed by: Donnice Mania MD 11/12/2024 01:57 PM EST RP Workstation: [id number]   DG Chest Port 1 View Result Date: 11/12/2024 CLINICAL DATA:  Syncope. EXAM: PORTABLE CHEST 1 VIEW COMPARISON:  04/24/2022 and older exams.   CT, 08/12/2023. FINDINGS: Cardiac silhouette is normal in size. No mediastinal or hilar masses. Lungs are hyperexpanded. Prominent interstitial markings, chronic. Mild reticulonodular opacities in the left lateral lung base consistent with atelectasis/scarring. No evidence of pneumonia or pulmonary edema. No pleural effusion or pneumothorax. Skeletal structures are grossly intact. IMPRESSION: No active disease. Electronically Signed   By: Alm Parkins M.D.   On: 11/12/2024 13:40    Cardiac Studies   N/A  Assessment   Principal Problem:   SIRS (systemic inflammatory response syndrome) (HCC) Active Problems:   Septic shock (HCC)   Abnormal ECG   Heart failure with preserved ejection fraction (HFpEF) (HCC)   Plan   BP normal to mildly elevated - will increase losartan to 25 mg daily. Check BMET as outpatient in 1-2 weeks. No further suggestions at this time. Can follow-up with Dr. Jeffrie as an outpatient.  She tells me she is being discharged today anyway.  [facility] will sign off.   Medication Recommendations:  as above Other recommendations (labs, testing, etc):  none Follow up as an outpatient:  Dr. Jeffrie or APP   Time Spent Directly with Patient:  I have spent a total of 35 minutes with the patient reviewing hospital notes, telemetry, EKGs, labs and examining the patient as well as establishing  an assessment and plan that was discussed personally with the patient.  > 50% of time was spent in direct patient care.  Length of Stay:  LOS: 2 days   Vinie KYM Maxcy, MD, [facility], FNLA, FACP  [place]  [facility]  Medical Director of the Advanced Lipid Disorders &  Cardiovascular Risk Reduction Clinic Diplomate of the American Board of Clinical Lipidology Attending Cardiologist  Direct Dial: [phone]  Fax: [phone]  Website:  [email or web address] Vinie BROCKS Johnathin Vanderschaaf 11/14/2024, 11:27 AM

## 2024-11-14 NOTE — Evaluation (Addendum)
 Occupational Therapy Evaluation Patient Details Name: [name] MRN: [id number] DOB: 1956-12-07 Today's Date: 11/14/2024   History of Present Illness   68 yo female admitted with LOC and encephalopathy PMH chronic pain, copd,IBS,panic disorder depression diverticulosis GERD R eye blindness PTSD chronic RF on oxygen  3L at night     Clinical Impressions Pt admitted based on above, and was seen based on problem list below. PTA pt was living with her husband and was independent with ADLs and IADLs. Today pt is requiring CGA for ADLs. Functional transfers are  CGA for balance with RW. Pt limited by pain and decreased strength. Recommendation of HHOT to address functional deficits. OT will continue to follow acutely to maximize functional independence.      If plan is discharge home, recommend the following:   A little help with walking and/or transfers;A little help with bathing/dressing/bathroom;Assistance with cooking/housework;Assist for transportation     Functional Status Assessment   Patient has had a recent decline in their functional status and demonstrates the ability to make significant improvements in function in a reasonable and predictable amount of time.     Equipment Recommendations   None recommended by OT      Precautions/Restrictions   Precautions Precautions: Fall Recall of Precautions/Restrictions: Intact Restrictions Weight Bearing Restrictions Per Provider Order: No     Mobility Bed Mobility Overal bed mobility: Modified Independent             General bed mobility comments: HOB mildly elevated    Transfers Overall transfer level: Needs assistance Equipment used: Rolling walker (2 wheels) Transfers: Sit to/from Stand Sit to Stand: Contact guard assist           General transfer comment: CGA, cues for hand placement      Balance Overall balance assessment: Needs assistance Sitting-balance support: Feet supported, No upper  extremity supported Sitting balance-Leahy Scale: Fair     Standing balance support: No upper extremity supported, Bilateral upper extremity supported, During functional activity Standing balance-Leahy Scale: Poor Standing balance comment: Reliant on RW       ADL either performed or assessed with clinical judgement   ADL Overall ADL's : Needs assistance/impaired Eating/Feeding: Set up;Sitting   Grooming: Wash/dry hands;Contact guard assist;Standing           Upper Body Dressing : Contact guard assist   Lower Body Dressing: Contact guard assist;Sit to/from stand Lower Body Dressing Details (indicate cue type and reason): Able to figure 4 legs Toilet Transfer: Contact guard assist;Ambulation;Rolling walker (2 wheels);Regular Toilet;Grab bars Toilet Transfer Details (indicate cue type and reason): CGA, reliant on GB Toileting- Clothing Manipulation and Hygiene: Sitting/lateral lean;Contact guard assist       Functional mobility during ADLs: Contact guard assist;Rolling walker (2 wheels) General ADL Comments: CGA for balance, limited by decreased strength     Vision Baseline Vision/History: 0 No visual deficits Patient Visual Report: No change from baseline Vision Assessment?: No apparent visual deficits            Pertinent Vitals/Pain Pain Assessment Pain Assessment: 0-10 Pain Score: 7  Pain Location: R ankle Pain Descriptors / Indicators: Discomfort Pain Intervention(s): Repositioned     Extremity/Trunk Assessment Upper Extremity Assessment Upper Extremity Assessment: Overall WFL for tasks assessed   Lower Extremity Assessment Lower Extremity Assessment: Defer to PT evaluation   Cervical / Trunk Assessment Cervical / Trunk Assessment: Kyphotic   Communication Communication Communication: No apparent difficulties   Cognition Arousal: Alert Behavior During Therapy: [facility] for tasks assessed/performed  Cognition: No apparent impairments             OT -  Cognition Comments: tangiental but overall WFL                 Following commands: Intact       Cueing  General Comments   Cueing Techniques: Verbal cues  VSS on RA           Home Living Family/patient expects to be discharged to:: Private residence Living Arrangements: Spouse/significant other Available Help at Discharge: Family;Available 24 hours/day Type of Home: House Home Access: Ramped entrance     Home Layout: One level     Bathroom Shower/Tub: Producer, Television/film/video: Handicapped height Bathroom Accessibility: Yes How Accessible: Accessible via walker Home Equipment: Rollator (4 wheels);BSC/3in1;Shower seat;Grab bars - tub/shower;Grab bars - toilet;Wheelchair - manual          Prior Functioning/Environment Prior Level of Function : Independent/Modified Independent             Mobility Comments: use of rollator, sometimes will use if as a w/c ADLs Comments: Ind    OT Problem List: Decreased strength;Decreased range of motion;Decreased activity tolerance;Impaired balance (sitting and/or standing);Decreased knowledge of use of DME or AE;Cardiopulmonary status limiting activity   OT Treatment/Interventions: Self-care/ADL training;Therapeutic exercise;Energy conservation;DME and/or AE instruction;Therapeutic activities;Patient/family education;Balance training      OT Goals(Current goals can be found in the care plan section)   Acute Rehab OT Goals Patient Stated Goal: To go home OT Goal Formulation: With patient Time For Goal Achievement: 11/28/24 Potential to Achieve Goals: Good   OT Frequency:  Min 2X/week    Co-evaluation PT/OT/SLP Co-Evaluation/Treatment: Yes Reason for Co-Treatment: To address functional/ADL transfers   OT goals addressed during session: ADL's and self-care      AM-PAC OT 6 Clicks Daily Activity     Outcome Measure Help from another person eating meals?: None Help from another person taking care of  personal grooming?: A Little Help from another person toileting, which includes using toliet, bedpan, or urinal?: A Little Help from another person bathing (including washing, rinsing, drying)?: A Little Help from another person to put on and taking off regular upper body clothing?: A Little Help from another person to put on and taking off regular lower body clothing?: A Little 6 Click Score: 19   End of Session Equipment Utilized During Treatment: Gait belt;Rolling walker (2 wheels) Nurse Communication: Mobility status  Activity Tolerance: Patient tolerated treatment well Patient left: in chair;with call bell/phone within reach;with chair alarm set  OT Visit Diagnosis: Unsteadiness on feet (R26.81);Other abnormalities of gait and mobility (R26.89);Muscle weakness (generalized) (M62.81)                Time: 8996-8961 OT Time Calculation (min): 35 min Charges:  OT General Charges $OT Visit: 1 Visit OT Evaluation $OT Eval Moderate Complexity: 1 Mod  Adrianne BROCKS, OT  Acute Rehabilitation Services Office [phone] Secure chat preferred   Adrianne GORMAN Savers 11/14/2024, 12:34 PM

## 2024-11-17 LAB — CULTURE, BLOOD (ROUTINE X 2)
Culture: NO GROWTH
Culture: NO GROWTH
Special Requests: ADEQUATE

## 2024-11-30 ENCOUNTER — Encounter (HOSPITAL_BASED_OUTPATIENT_CLINIC_OR_DEPARTMENT_OTHER): Admitting: General Surgery

## 2024-12-22 ENCOUNTER — Encounter: Payer: Self-pay | Admitting: Cardiology

## 2025-01-03 ENCOUNTER — Encounter (HOSPITAL_BASED_OUTPATIENT_CLINIC_OR_DEPARTMENT_OTHER): Admitting: General Surgery
